# Patient Record
Sex: Female | Born: 1965 | Race: Black or African American | Hispanic: No | Marital: Single | Smoking: Former smoker
Health system: Southern US, Community
[De-identification: ages and names within clinical notes are randomized; demographics above are authoritative.]

## PROBLEM LIST (undated history)

## (undated) DIAGNOSIS — I10 Essential (primary) hypertension: Secondary | ICD-10-CM

## (undated) DIAGNOSIS — R569 Unspecified convulsions: Secondary | ICD-10-CM

## (undated) DIAGNOSIS — N189 Chronic kidney disease, unspecified: Secondary | ICD-10-CM

## (undated) DIAGNOSIS — K7030 Alcoholic cirrhosis of liver without ascites: Secondary | ICD-10-CM

## (undated) HISTORY — PX: OTHER SURGICAL HISTORY: SHX169

## (undated) HISTORY — PX: MANDIBLE SURGERY: SHX707

---

## 2004-01-27 ENCOUNTER — Other Ambulatory Visit: Payer: Self-pay

## 2005-02-25 ENCOUNTER — Emergency Department: Payer: Self-pay | Admitting: Emergency Medicine

## 2005-08-14 ENCOUNTER — Emergency Department: Payer: Self-pay | Admitting: Emergency Medicine

## 2007-07-08 ENCOUNTER — Emergency Department: Payer: Self-pay | Admitting: Emergency Medicine

## 2007-12-12 ENCOUNTER — Emergency Department: Payer: Self-pay | Admitting: Emergency Medicine

## 2008-02-05 ENCOUNTER — Emergency Department: Payer: Self-pay | Admitting: Emergency Medicine

## 2008-02-10 ENCOUNTER — Ambulatory Visit: Payer: Self-pay | Admitting: Unknown Physician Specialty

## 2008-08-21 ENCOUNTER — Other Ambulatory Visit: Payer: Self-pay

## 2008-08-21 ENCOUNTER — Emergency Department: Payer: Self-pay | Admitting: Emergency Medicine

## 2009-08-27 ENCOUNTER — Emergency Department: Payer: Self-pay

## 2009-12-21 ENCOUNTER — Emergency Department: Payer: Self-pay | Admitting: Emergency Medicine

## 2010-05-22 ENCOUNTER — Emergency Department: Payer: Self-pay | Admitting: Emergency Medicine

## 2011-06-11 ENCOUNTER — Emergency Department: Payer: Self-pay | Admitting: Emergency Medicine

## 2013-02-14 ENCOUNTER — Inpatient Hospital Stay: Payer: Self-pay | Admitting: Student

## 2013-02-14 LAB — CBC
HCT: 30 % — ABNORMAL LOW (ref 35.0–47.0)
HGB: 9.9 g/dL — ABNORMAL LOW (ref 12.0–16.0)
MCH: 29.9 pg (ref 26.0–34.0)
MCV: 91 fL (ref 80–100)
Platelet: 75 10*3/uL — ABNORMAL LOW (ref 150–440)
RBC: 3.29 10*6/uL — ABNORMAL LOW (ref 3.80–5.20)
RDW: 17.9 % — ABNORMAL HIGH (ref 11.5–14.5)

## 2013-02-14 LAB — DRUG SCREEN, URINE
Barbiturates, Ur Screen: NEGATIVE (ref ?–200)
Benzodiazepine, Ur Scrn: NEGATIVE (ref ?–200)
MDMA (Ecstasy)Ur Screen: NEGATIVE (ref ?–500)
Opiate, Ur Screen: POSITIVE (ref ?–300)
Tricyclic, Ur Screen: NEGATIVE (ref ?–1000)

## 2013-02-14 LAB — BASIC METABOLIC PANEL
Anion Gap: 11 (ref 7–16)
BUN: 31 mg/dL — ABNORMAL HIGH (ref 7–18)
Calcium, Total: 8.2 mg/dL — ABNORMAL LOW (ref 8.5–10.1)
Co2: 23 mmol/L (ref 21–32)
Creatinine: 1.76 mg/dL — ABNORMAL HIGH (ref 0.60–1.30)
EGFR (African American): 39 — ABNORMAL LOW
EGFR (Non-African Amer.): 34 — ABNORMAL LOW
Glucose: 112 mg/dL — ABNORMAL HIGH (ref 65–99)
Osmolality: 264 (ref 275–301)

## 2013-02-14 LAB — IRON AND TIBC
Iron Bind.Cap.(Total): 273 ug/dL (ref 250–450)
Iron: 14 ug/dL — ABNORMAL LOW (ref 50–170)
Unbound Iron-Bind.Cap.: 259 ug/dL

## 2013-02-14 LAB — HEPATIC FUNCTION PANEL A (ARMC)
Albumin: 2.3 g/dL — ABNORMAL LOW (ref 3.4–5.0)
Alkaline Phosphatase: 60 U/L (ref 50–136)
Bilirubin, Direct: 2.9 mg/dL — ABNORMAL HIGH (ref 0.00–0.20)
Bilirubin,Total: 3.8 mg/dL — ABNORMAL HIGH (ref 0.2–1.0)
SGOT(AST): 112 U/L — ABNORMAL HIGH (ref 15–37)
SGPT (ALT): 44 U/L (ref 12–78)
Total Protein: 8.6 g/dL — ABNORMAL HIGH (ref 6.4–8.2)

## 2013-02-14 LAB — RETICULOCYTES: Reticulocyte: 1.64 %

## 2013-02-15 LAB — CBC WITH DIFFERENTIAL/PLATELET
Bands: 12 %
HCT: 27.2 % — ABNORMAL LOW (ref 35.0–47.0)
HGB: 9 g/dL — ABNORMAL LOW (ref 12.0–16.0)
Lymphocytes: 16 %
MCH: 30.5 pg (ref 26.0–34.0)
MCHC: 33.3 g/dL (ref 32.0–36.0)
Metamyelocyte: 4 %
Monocytes: 17 %
Platelet: 69 10*3/uL — ABNORMAL LOW (ref 150–440)
Segmented Neutrophils: 51 %

## 2013-02-15 LAB — BASIC METABOLIC PANEL
Anion Gap: 11 (ref 7–16)
BUN: 33 mg/dL — ABNORMAL HIGH (ref 7–18)
Calcium, Total: 7.5 mg/dL — ABNORMAL LOW (ref 8.5–10.1)
Chloride: 96 mmol/L — ABNORMAL LOW (ref 98–107)
EGFR (African American): 42 — ABNORMAL LOW
EGFR (Non-African Amer.): 36 — ABNORMAL LOW
Potassium: 2.8 mmol/L — ABNORMAL LOW (ref 3.5–5.1)

## 2013-02-15 LAB — HEMOGLOBIN: HGB: 9.6 g/dL — ABNORMAL LOW (ref 12.0–16.0)

## 2013-02-15 LAB — MAGNESIUM: Magnesium: 1.4 mg/dL — ABNORMAL LOW

## 2013-02-16 LAB — BASIC METABOLIC PANEL
Anion Gap: 5 — ABNORMAL LOW (ref 7–16)
Calcium, Total: 7.7 mg/dL — ABNORMAL LOW (ref 8.5–10.1)
Chloride: 100 mmol/L (ref 98–107)
Co2: 28 mmol/L (ref 21–32)
Creatinine: 0.88 mg/dL (ref 0.60–1.30)
EGFR (African American): >60
EGFR (Non-African Amer.): >60
Glucose: 101 mg/dL — ABNORMAL HIGH (ref 65–99)
Osmolality: 269 (ref 275–301)
Potassium: 4 mmol/L (ref 3.5–5.1)
Sodium: 133 mmol/L — ABNORMAL LOW (ref 136–145)

## 2013-02-16 LAB — CBC WITH DIFFERENTIAL/PLATELET
Bands: 2 %
MCHC: 33.2 g/dL (ref 32.0–36.0)
Platelet: 95 10*3/uL — ABNORMAL LOW (ref 150–440)
RBC: 3 10*6/uL — ABNORMAL LOW (ref 3.80–5.20)
Segmented Neutrophils: 78 %
Variant Lymphocyte - H1-Rlymph: 1 %

## 2013-02-17 DIAGNOSIS — I059 Rheumatic mitral valve disease, unspecified: Secondary | ICD-10-CM

## 2013-02-17 LAB — CULTURE, BLOOD (SINGLE)

## 2013-02-18 LAB — HEPATIC FUNCTION PANEL A (ARMC)
Albumin: 1.9 g/dL — ABNORMAL LOW (ref 3.4–5.0)
Alkaline Phosphatase: 85 U/L (ref 50–136)
Bilirubin, Direct: 0.8 mg/dL — ABNORMAL HIGH (ref 0.00–0.20)
Bilirubin,Total: 1.2 mg/dL — ABNORMAL HIGH (ref 0.2–1.0)
SGOT(AST): 113 U/L — ABNORMAL HIGH (ref 15–37)
Total Protein: 7.5 g/dL (ref 6.4–8.2)

## 2013-02-18 LAB — CULTURE, BLOOD (SINGLE)

## 2014-03-28 ENCOUNTER — Emergency Department: Payer: Self-pay | Admitting: Emergency Medicine

## 2014-07-22 ENCOUNTER — Inpatient Hospital Stay: Payer: Self-pay | Admitting: Specialist

## 2014-07-22 LAB — DIFFERENTIAL
BASOS ABS: 1 %
EOS PCT: 1 %
Lymphocytes: 53 %
MONOS PCT: 5 %
NRBC/100 WBC: 1 /
Segmented Neutrophils: 40 %

## 2014-07-22 LAB — COMPREHENSIVE METABOLIC PANEL
ALBUMIN: 3 g/dL — AB (ref 3.4–5.0)
ANION GAP: 12 (ref 7–16)
Alkaline Phosphatase: 60 U/L
BUN: 5 mg/dL — ABNORMAL LOW (ref 7–18)
Bilirubin,Total: 0.5 mg/dL (ref 0.2–1.0)
CALCIUM: 8.1 mg/dL — AB (ref 8.5–10.1)
CO2: 21 mmol/L (ref 21–32)
Chloride: 102 mmol/L (ref 98–107)
Creatinine: 0.84 mg/dL (ref 0.60–1.30)
EGFR (African American): >60
EGFR (Non-African Amer.): >60
Glucose: 112 mg/dL — ABNORMAL HIGH (ref 65–99)
OSMOLALITY: 268 (ref 275–301)
Potassium: 3.6 mmol/L (ref 3.5–5.1)
SGOT(AST): 67 U/L — ABNORMAL HIGH (ref 15–37)
SGPT (ALT): 38 U/L
Sodium: 135 mmol/L — ABNORMAL LOW (ref 136–145)
Total Protein: 8.7 g/dL — ABNORMAL HIGH (ref 6.4–8.2)

## 2014-07-22 LAB — DRUG SCREEN, URINE
Amphetamines, Ur Screen: NEGATIVE (ref ?–1000)
Barbiturates, Ur Screen: NEGATIVE (ref ?–200)
Benzodiazepine, Ur Scrn: NEGATIVE (ref ?–200)
Cannabinoid 50 Ng, Ur ~~LOC~~: NEGATIVE (ref ?–50)
Cocaine Metabolite,Ur ~~LOC~~: POSITIVE (ref ?–300)
MDMA (ECSTASY) UR SCREEN: NEGATIVE (ref ?–500)
Methadone, Ur Screen: NEGATIVE (ref ?–300)
Opiate, Ur Screen: NEGATIVE (ref ?–300)
PHENCYCLIDINE (PCP) UR S: NEGATIVE (ref ?–25)
TRICYCLIC, UR SCREEN: NEGATIVE (ref ?–1000)

## 2014-07-22 LAB — CBC
HCT: 15.6 % — AB (ref 35.0–47.0)
HGB: 4.6 g/dL — CL (ref 12.0–16.0)
MCH: 18.9 pg — ABNORMAL LOW (ref 26.0–34.0)
MCHC: 29.5 g/dL — ABNORMAL LOW (ref 32.0–36.0)
MCV: 64 fL — ABNORMAL LOW (ref 80–100)
Platelet: 195 10*3/uL (ref 150–440)
RBC: 2.43 10*6/uL — AB (ref 3.80–5.20)
RDW: 21.1 % — AB (ref 11.5–14.5)
WBC: 6.5 10*3/uL (ref 3.6–11.0)

## 2014-07-22 LAB — TROPONIN I: TROPONIN-I: 0.04 ng/mL

## 2014-07-22 LAB — ETHANOL
Ethanol %: 0.391 % (ref 0.000–0.080)
Ethanol: 391 mg/dL

## 2014-07-22 LAB — CK TOTAL AND CKMB (NOT AT ARMC)
CK, Total: 148 U/L
CK-MB: 1.2 ng/mL (ref 0.5–3.6)

## 2014-07-22 LAB — PRO B NATRIURETIC PEPTIDE: B-Type Natriuretic Peptide: 74 pg/mL (ref 0–125)

## 2014-07-23 LAB — CBC WITH DIFFERENTIAL/PLATELET
Basophil #: 0.1 10*3/uL (ref 0.0–0.1)
Basophil %: 1 %
EOS ABS: 0 10*3/uL (ref 0.0–0.7)
EOS PCT: 1 %
Eosinophil %: 0.4 %
HCT: 22.2 % — AB (ref 35.0–47.0)
HCT: 21.5 % — ABNORMAL LOW (ref 35.0–47.0)
HCT: 23.2 % — AB (ref 35.0–47.0)
HGB: 6.8 g/dL — ABNORMAL LOW (ref 12.0–16.0)
HGB: 6.4 g/dL — ABNORMAL LOW (ref 12.0–16.0)
HGB: 7 g/dL — ABNORMAL LOW (ref 12.0–16.0)
Lymphocyte #: 1.7 10*3/uL (ref 1.0–3.6)
Lymphocyte %: 26.1 %
Lymphocytes: 50 %
Lymphocytes: 31 %
MCH: 22.2 pg — ABNORMAL LOW (ref 26.0–34.0)
MCH: 21.5 pg — ABNORMAL LOW (ref 26.0–34.0)
MCH: 21.8 pg — ABNORMAL LOW (ref 26.0–34.0)
MCHC: 30.4 g/dL — AB (ref 32.0–36.0)
MCHC: 29.7 g/dL — ABNORMAL LOW (ref 32.0–36.0)
MCHC: 30.2 g/dL — AB (ref 32.0–36.0)
MCV: 73 fL — ABNORMAL LOW (ref 80–100)
MCV: 72 fL — ABNORMAL LOW (ref 80–100)
MCV: 72 fL — ABNORMAL LOW (ref 80–100)
MONOS PCT: 13.4 %
Monocyte #: 0.9 x10 3/mm (ref 0.2–0.9)
Monocytes: 11 %
Monocytes: 9 %
NRBC/100 WBC: 1 /
NRBC/100 WBC: 1 /
Neutrophil #: 3.8 10*3/uL (ref 1.4–6.5)
Neutrophil %: 59.1 %
PLATELETS: 156 10*3/uL (ref 150–440)
Platelet: 152 10*3/uL (ref 150–440)
Platelet: 144 10*3/uL — ABNORMAL LOW (ref 150–440)
RBC: 3.04 10*6/uL — AB (ref 3.80–5.20)
RBC: 2.97 10*6/uL — ABNORMAL LOW (ref 3.80–5.20)
RBC: 3.2 10*6/uL — AB (ref 3.80–5.20)
RDW: 27.9 % — ABNORMAL HIGH (ref 11.5–14.5)
RDW: 27.7 % — ABNORMAL HIGH (ref 11.5–14.5)
RDW: 27.8 % — ABNORMAL HIGH (ref 11.5–14.5)
SEGMENTED NEUTROPHILS: 38 %
Segmented Neutrophils: 60 %
WBC: 5 10*3/uL (ref 3.6–11.0)
WBC: 5.2 10*3/uL (ref 3.6–11.0)
WBC: 6.4 10*3/uL (ref 3.6–11.0)

## 2014-07-23 LAB — TROPONIN I
TROPONIN-I: 0.04 ng/mL
TROPONIN-I: 0.04 ng/mL

## 2014-07-23 LAB — LIPID PANEL
CHOLESTEROL: 87 mg/dL (ref 0–200)
HDL Cholesterol: 32 mg/dL — ABNORMAL LOW (ref 40–60)
LDL CHOLESTEROL, CALC: 45 mg/dL (ref 0–100)
Triglycerides: 48 mg/dL (ref 0–200)
VLDL Cholesterol, Calc: 10 mg/dL (ref 5–40)

## 2014-07-23 LAB — CK-MB
CK-MB: 1 ng/mL (ref 0.5–3.6)
CK-MB: 1 ng/mL (ref 0.5–3.6)

## 2014-07-23 LAB — TSH: Thyroid Stimulating Horm: 2.15 u[IU]/mL

## 2014-07-23 LAB — IRON AND TIBC
IRON BIND. CAP.(TOTAL): 493 ug/dL — AB (ref 250–450)
Iron Saturation: 15 %
Iron: 74 ug/dL (ref 50–170)
Unbound Iron-Bind.Cap.: 419 ug/dL

## 2014-07-23 LAB — FERRITIN: Ferritin (ARMC): 21 ng/mL (ref 8–388)

## 2014-07-23 LAB — LACTATE DEHYDROGENASE: LDH: 169 U/L (ref 81–246)

## 2014-07-24 LAB — PROTIME-INR
INR: 1.3
PROTHROMBIN TIME: 15.6 s — AB (ref 11.5–14.7)

## 2014-07-24 LAB — HEMOGLOBIN: HGB: 6.9 g/dL — ABNORMAL LOW (ref 12.0–16.0)

## 2014-07-25 LAB — CBC WITH DIFFERENTIAL/PLATELET
BASOS ABS: 0 10*3/uL (ref 0.0–0.1)
BASOS PCT: 0.5 %
EOS ABS: 0.1 10*3/uL (ref 0.0–0.7)
Eosinophil %: 1.4 %
HCT: 22.7 % — AB (ref 35.0–47.0)
HGB: 6.7 g/dL — ABNORMAL LOW (ref 12.0–16.0)
LYMPHS ABS: 1.4 10*3/uL (ref 1.0–3.6)
Lymphocyte %: 18.3 %
MCH: 21.6 pg — AB (ref 26.0–34.0)
MCHC: 29.6 g/dL — ABNORMAL LOW (ref 32.0–36.0)
MCV: 73 fL — AB (ref 80–100)
Monocyte #: 1 x10 3/mm — ABNORMAL HIGH (ref 0.2–0.9)
Monocyte %: 13.6 %
Neutrophil #: 5 10*3/uL (ref 1.4–6.5)
Neutrophil %: 66.2 %
PLATELETS: 139 10*3/uL — AB (ref 150–440)
RBC: 3.11 10*6/uL — AB (ref 3.80–5.20)
RDW: 28.2 % — AB (ref 11.5–14.5)
WBC: 7.6 10*3/uL (ref 3.6–11.0)

## 2014-07-25 LAB — DRUG SCREEN, URINE
AMPHETAMINES, UR SCREEN: NEGATIVE (ref ?–1000)
BARBITURATES, UR SCREEN: NEGATIVE (ref ?–200)
BENZODIAZEPINE, UR SCRN: NEGATIVE (ref ?–200)
COCAINE METABOLITE, UR ~~LOC~~: NEGATIVE (ref ?–300)
Cannabinoid 50 Ng, Ur ~~LOC~~: NEGATIVE (ref ?–50)
MDMA (ECSTASY) UR SCREEN: NEGATIVE (ref ?–500)
Methadone, Ur Screen: NEGATIVE (ref ?–300)
Opiate, Ur Screen: NEGATIVE (ref ?–300)
Phencyclidine (PCP) Ur S: NEGATIVE (ref ?–25)
TRICYCLIC, UR SCREEN: NEGATIVE (ref ?–1000)

## 2014-07-25 LAB — BASIC METABOLIC PANEL
Anion Gap: 7 (ref 7–16)
BUN: 7 mg/dL (ref 7–18)
CO2: 26 mmol/L (ref 21–32)
Calcium, Total: 8.7 mg/dL (ref 8.5–10.1)
Chloride: 103 mmol/L (ref 98–107)
Creatinine: 0.91 mg/dL (ref 0.60–1.30)
GLUCOSE: 121 mg/dL — AB (ref 65–99)
Osmolality: 271 (ref 275–301)
Potassium: 3.6 mmol/L (ref 3.5–5.1)
SODIUM: 136 mmol/L (ref 136–145)

## 2014-07-26 LAB — CBC WITH DIFFERENTIAL/PLATELET
Basophil #: 0.1 10*3/uL (ref 0.0–0.1)
Basophil %: 0.7 %
EOS PCT: 1.9 %
Eosinophil #: 0.2 10*3/uL (ref 0.0–0.7)
HCT: 28.5 % — AB (ref 35.0–47.0)
HGB: 9.1 g/dL — ABNORMAL LOW (ref 12.0–16.0)
Lymphocyte #: 1.6 10*3/uL (ref 1.0–3.6)
Lymphocyte %: 18.3 %
MCH: 24.2 pg — AB (ref 26.0–34.0)
MCHC: 32 g/dL (ref 32.0–36.0)
MCV: 76 fL — ABNORMAL LOW (ref 80–100)
MONO ABS: 1.1 x10 3/mm — AB (ref 0.2–0.9)
Monocyte %: 12.6 %
NEUTROS ABS: 5.7 10*3/uL (ref 1.4–6.5)
Neutrophil %: 66.5 %
Platelet: 134 10*3/uL — ABNORMAL LOW (ref 150–440)
RBC: 3.77 10*6/uL — AB (ref 3.80–5.20)
RDW: 28.7 % — AB (ref 11.5–14.5)
WBC: 8.6 10*3/uL (ref 3.6–11.0)

## 2014-07-26 LAB — OCCULT BLOOD X 1 CARD TO LAB, STOOL: Occult Blood, Feces: POSITIVE

## 2014-07-28 LAB — PATHOLOGY REPORT

## 2015-04-12 NOTE — Discharge Summary (Signed)
PATIENT NAME:  Abigail Cook, Abigail Cook MR#:  [id number] DATE OF BIRTH:  1966/09/28  DATE OF ADMISSION:  02/14/2013 DATE OF DISCHARGE:  02/18/2013  CHIEF COMPLAINT:  Short of breath and fevers.   DISCHARGE DIAGNOSES:  1.  Septic shock, likely secondary to strep pneumonia and strep pneumonia bacteremia.  2.  Acute respiratory failure.  3.  Acute renal failure.  4.  Elevated liver function tests likely secondary to sepsis.  5.  Chronic anemia.  6.  A history of epistaxis.  7.  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  8.  A history of cocaine and alcohol abuse in the past.   DISCHARGE MEDICATIONS:  1.  Tamiflu 75 mg 1 tab q.12 hours for 2 doses.  2.  Ceftin ER 300 mg 1 cap 2 times a day for 6 days.  3.  Acetaminophen/hydrocodone 325/5 mg 1 tab every 6 hours as needed for pain.   DIET:  Low sodium.   ACTIVITY:  As tolerated.   FOLLOWUP:  Please follow with [facility] on February 27th as previously scheduled later this month. Please repeat x-ray of the chest in 4 weeks or so to re-evaluate pneumoniae. If worsening shortness of breath, productive cough, recurrent fevers, or any other symptoms consult to the doctor right away.   DISPOSITION:  Home.  CODE STATUS: THE PATIENT IS FULL CODE.   HISTORY OF PRESENT ILLNESS:  For full details of H and P, please see the dictation February 25 by Dr. Bridgette Habermann but briefly, this is a 49 year old female who was not on any chronic medications, came in with fevers, productive of sputum and multiple organ failures including renal and respiratory with elevated LFTs and was hypotensive. She was given IV fluids. She had high-grade fever of 103 and a pulse rate 120 and admitted to the Hospitalist service for pneumonia and started with ceftriaxone and azithromycin to cover for community-acquired pneumonia, and Tamiflu to cover for influenza pneumonia given the severity of her illness and started on isolation precautions.   SIGNIFICANT LABS AND IMAGING:   Initial albumin 2.3, a bilirubin of 3.8, a direct bilirubin of 2.9, AST of 112, initial creatinine 1.76, BUN 31, sodium 128, potassium 3, creatinine is normalized at 2.88 by discharge with a sodium of 133, potassium of 4, BUN of 20, ferritin 428, LDH slightly elevated at 275, serum iron is 14 and TIBC is 273. Urine tox was positive for opiates. Initial WBC of 9.5, hemoglobin 9.9, platelets of 75. Blood cultures on admission showed 2 out of 2 bottles with streptococcus pneumonia. Influenza negative. H1N1 negative. Repeat blood cultures from February 27 was no growth to date thus far. Alpha globulin 337. Legionella urine antigen negative, strep urinary antigen was surprisingly negative. The pH on the ABG showed pH of 7.47, pCO2 of 30, pO2 of 69.   Initial x-ray of the chest, 1 view, showed left lower lobe pneumonia. Repeat x-ray of the chest on February 27th showed left lower lobe pneumonia. No pleural effusions or pneumothorax.   Ultrasound of the abdomen, limited survey, showed no cholelithiasis or sonographic evidence of acute cholecystitis. Renal ultrasound, bilateral, showing normal-appearing bilateral renal sonogram.   HOSPITAL COURSE:  The patient was admitted to the Hospitalist service and started on ceftriaxone and azithromycin to cover for community-acquired pneumonia. The patient had multiple lab abnormalities and appeared severely ill. The patient was given IV fluids as well as blood cultures were sent. The patient had a left lower lobe pneumonia. She has pleuritic  chest pain. Blood cultures did come back with strep pneumonia. Initially as they came back with the gram-positive cocci, the Rocephin was changed to vancomycin. The patient's sepsis resolved with treatment and blood cultures speciated to strep pneumonia, and antibiotic was changed to [facility]. At this point, she has no further hypoxemia and the fever has resolved. She has been weaned off of the oxygen. Repeat x-ray of the chest was  performed and that did not show any effusions or evidence for empyema but the patient has persistent pleuritic chest pain, likely from the severe pneumonia she had. Her renal failure and LFTs have significantly improved. Her respiratory failure has resolved. She has anemia which likely is chronic disease and ethanol related. She had thrombocytopenia and anemia. Both are likely secondary to ethanol abuse, and at this point her symptoms are significantly better. Today is third day of 4 for Tamiflu which she was initially started on prophylactically given the severity of her illness. However, H1N1 and rapid flu are negative. She will be discharged with an additional day of Tamiflu. At this point, she has an appointment with [facility] and she was strongly encouraged follow up and repeat an x-ray of the chest in about 4 weeks to evaluate for the resolution of the pneumonia. Her electrolyte abnormalities have significantly improvement including the hypokalemia and the hyponatremia.   Total Time Spent:  35 minutes.  CODE STATUS:  THE PATIENT IS A FULL CODE.   ____________________________ Vivien Presto, MD sa:jm D: 02/18/2013 14:10:36 ET T: 02/18/2013 15:11:25 ET JOB#: [id number]  cc: Vivien Presto, MD, <Dictator> [facility] [place] MD ELECTRONICALLY SIGNED 02/27/2013 13:18

## 2015-04-12 NOTE — H&P (Signed)
PATIENT NAME:  Abigail Cook, COLGATE MR#:  [id number] DATE OF BIRTH:  02/02/1966  DATE OF ADMISSION:  02/14/2013  REFERRING PHYSICIAN:  Dr. Cheri Guppy.   PRIMARY CARE PHYSICIAN:  None.   CHIEF COMPLAINT:  Shortness of breath and fevers.   HISTORY OF PRESENT ILLNESS:  The patient is a pleasant 49 year old African American female with no significant past medical history except for seizures related to alcohol and polysubstance abuse in the past on no medications who presented with the above chief complaint. The patient stated that she had acute onset shortness of breath, productive cough with greenish sputum, fevers, chills, nausea or vomiting and felt unwell around Friday. The patient has no sick contacts nor recent hospitalizations. The patient's shortness of breath progressed. She also describes having chronic epistaxis which can occur daily and has had black tarry stools. The patient feels she has had poor p.o. intake and p.o. intolerance recently. She complains of left-sided pleuritic chest pain on deep breathing. She presented here today and was noted to have left-sided pneumonia, but multiple lab abnormalities including renal failure, hyponatremia and elevated liver enzymes in addition to low platelets with a left-sided pneumonia. She was tachycardic and hypotensive on arrival with a high-grade fever of 103. Her pulse was 120. Initial blood pressure was 82/50. She was given a liter of IV fluids and blood cultures were obtained. Hospitalist services were contacted then to further evaluate and admit the patient.   PAST MEDICAL HISTORY:  History of cocaine and alcohol abuse in the past, sober now, history of seizures related to alcohol and left humerus fracture in the past. There is also right jaw surgery.   ALLERGIES:  Denies.   OUTPATIENT MEDICATIONS:  Denies.   SOCIAL HISTORY:  Smokes several cigarettes a day. Quit alcohol about a year ago or so. Denies having any cocaine use recently. Stated that she is  sober from that as well.   FAMILY HISTORY:  Dad and mom with seizures.   REVIEW OF SYSTEMS:  CONSTITUTIONAL: Positive for fever, fatigue and overall weakness.  EYES: Has chronic blurry vision on the left.  ENT: Positive for sore throat.  RESPIRATORY: Positive for productive cough, shortness of breath and painful respiration on the left side of the chest.  CARDIOVASCULAR: Denies swelling in the legs or high blood pressure. No palpitations.  GASTROINTESTINAL: Positive for nausea, vomiting and diarrhea. The nausea and vomiting are more recent, several times a day, and has 1 episode of black tarry stools daily also since Friday or so, but is getting better per patient. No constipation.  GENITOURINARY: Denies dysuria or hematuria.  HEMATOLOGIC/LYMPHATICS: Denies anemia or easy bruising.  SKIN: Denies any rashes.  MUSCULOSKELETAL: Has chronic hip pain.  NEUROLOGIC:  Global weakness without any focal weakness. No stroke or seizures recently.  PSYCHIATRIC: Denies anxiety or insomnia.   PHYSICAL EXAMINATION: VITAL SIGNS: Temperature on arrival is 103.1, pulse 120, respiratory rate initially noted 20, but while I was in the room, it was between 35 to 40 breaths per minute, initial blood pressure 82/50, last blood pressure 111/52 and sats 94% on room air noted.  GENERAL: The patient is an [place] American female lying in bed in moderate respiratory distress talking in short sentences and appears to be dyspneic after talking.  HEENT: Normocephalic, atraumatic. Pupils are equal and reactive. Anicteric sclerae and also injected sclerae. Right-sided facial healed scar about the jawline. Dry mucous membranes.  NECK: Supple. No thyroid tenderness. No cervical lymphadenopathy.  CARDIOVASCULAR: S1, S2, tachycardic. No significant  murmurs appreciated.  LUNGS: Left mid to lower lobe rales, bilateral diffuse wheezing and decreased breath sounds.  ABDOMEN: Soft, nontender, nondistended. Positive bowel sounds in  all quadrants. No organomegaly appreciated.  EXTREMITIES: No significant lower extremity edema.  SKIN: No obvious rashes.  NEUROLOGIC: Cranial nerves II through XII grossly intact. Strength is 5 out of 5 in all extremities. Sensation is intact to light touch.  PSYCHIATRIC: Awake, alert, oriented x 3, a little bit anxious.   DIAGNOSTIC DATA:  Glucose 112, BUN 31, creatinine 1.76, sodium of 128, potassium of 3, total protein 8.6, albumin of 2.3, bilirubin total 3.8, direct is 2.9, AST 112 (of note, it was in the 200 range previously in 2011 and in 2012) and ALT of 44. WBC is 9.5, hemoglobin 9.9, platelets 75, MCV 91. X-ray of the chest, single view, is showing findings worrisome for left lower lobe pneumonia and EKG sinus tach, rate is 117, nonspecific ST and T wave changes, no acute ST elevations.   ASSESSMENT AND PLAN:  We have a 49 year old African American female with history of seizures relating to alcohol use in the past which patient stated that she is sober now with tobacco abuse who presents with acute onset of shortness of breath and multiple other complaints including fevers, chills and pleuritic chest pain and found with severe sepsis with hypotension on admission with renal failure, acute on chronic thrombocytopenia, elevated liver function tests as well as left-sided pneumonia. The patient appears to have severe sepsis with significant tachycardia, fever, pneumonia and renal failure. We will panculture her including blood cultures, sputum cultures, rapid flu and H1N1. Given the severity of her symptoms and organ involvement, I will start her on Tamiflu prophylactically and start her on ceftriaxone and azithromycin intravenously and place her on droplet precautions at this point. The patient denies having any recent antibiotics or hospitalizations in which makes this a community-acquired pneumonia. We will follow the sputum cultures, obtain urine strep and Legionella antigens and we will have a  low threshold for upgrading the antibiotics. The patient appears to have renal failure secondary to sepsis as well as some hypotension on admission which could be acute tubular necrosis in nature. We will give her another liter of intravenous fluid bolus and start her on standing intravenous fluids and trend her glomerular filtration rate. She also has hyponatremia likely secondary to dehydration and will monitor it after treating with normal saline. The patient appears to have anemia and thrombocytopenia, both are chronic, but thrombocytopenia is more so than baseline. The patient does have elevated liver function tests and an element of indirect bilirubin. It is possible that this is hemolysis, but more so it could be the epistaxis the patient is experiencing. We will also start her on proton pump inhibitor b.i.d. given that she has had some dark melenic stools per her description and follow hemoglobin and hematocrit every 8 hours. Given the chronicity of her epistaxis, it is possible that the dark stool is simply swallowed blood. I will consider a gastroenterology versus ears, nose and throat consult and check iron studies, ferritin, LDH and reticulocyte count. The patient appears to have respiratory failure which is acute, is tachypneic, and speaks in short sentences. We will monitor her closely, start her on nebulizers, treat the pneumonia and check an ABG and lactic acid. We will consider bilevel positive airway pressure if she needs it. We will start her on Zofran for nausea and vomiting and get an abdominal ultrasound for the elevated liver function  tests. It is also possible that the liver function test elevation is secondary to her severe sepsis. The patient was also counseled for 3 minutes and will be started on a nicotine patch for her tobacco abuse. The patient overall appears to be at high risk of cardiopulmonary complications and arrest.   CODE STATUS: The patient is a full code.  CRITICAL CARE  TIME SPENT:  70 minutes.    ____________________________ Vivien Presto, MD sa:si D: 02/14/2013 16:48:48 ET T: 02/14/2013 17:31:03 ET JOB#: [id number]  cc: Vivien Presto, MD, <Dictator> Vivien Presto MD ELECTRONICALLY SIGNED 02/27/2013 13:17

## 2015-04-13 NOTE — H&P (Signed)
PATIENT NAME:  Abigail Cook, BRUN MR#:  [id number] DATE OF BIRTH:  19-Mar-1966  DATE OF ADMISSION:  07/22/2014  REFERRING PHYSICIAN: Dr. Jimmye Norman  PRIMARY CARE PHYSICIAN: None.   CHIEF COMPLAINT: Chest pain.  HISTORY OF PRESENT ILLNESS: A 49 year old African American female with a history of alcohol abuse, cocaine abuse presenting with chest pain. Her history is somewhat limited, as patient is currently intoxicated; apparently drank 80 ounces of beer prior to arrival as well as took cocaine in some form or another. States that she had chest pain which was acute onset, retrosternal location, described only as "pain." Intensity 9/10 with radiation to her hips. No worsening or relieving factors. Denies any shortness of breath associated with symptoms. On EMS arrival, she appeared anxious, agitated, and hyperventilating, followed by a brief syncopal episode without any noted seizure activity. On initial workup, she was found to be anemic, with hemoglobin of 4.6. Last known hemoglobin was in the 9 range throughout all of our documentation. She denies any bleeding or bruising, any change in bowel habits. Denies any melena or hematochezia. She states that she has had no vaginal bleeding. No period since 1987 for unknown etiology. Currently she is still anxious; however, has no further complaints.  REVIEW OF SYSTEMS:  CONSTITUTIONAL: Denies fevers, chills, fatigue.  EYES: Denies blurred vision, double vision, or eye pain.  EARS, NOSE, THROAT: Denies tenderness, ear pain, hearing loss.  RESPIRATORY: Denies cough, wheeze, shortness of breath.  CARDIOVASCULAR: Positive for chest pain as described above as well as syncope as described above. Denies any palpitations, edema.  GASTROINTESTINAL: Denies nausea, vomiting, diarrhea, abdominal pain, hematochezia, melena.  GENITOURINARY: Denies dysuria, hematuria. ENDOCRINE: Denies nocturia or thyroid problems.  HEMATOLOGIC AND LYMPHATICS: Denies any bruising or  bleeding.  SKIN: Denies rashes or lesions.  MUSCULOSKELETAL: Denies pain in neck, back, shoulder, knees, hips, or arthritic symptoms.  NEUROLOGIC: She denies paralysis, paresthesias.  PSYCHIATRIC: Denies anxiety or depressive symptoms Otherwise, full review of systems performed by me is negative.   PAST MEDICAL HISTORY: Alcohol abuse with a history of withdrawal seizures, tobacco abuse, as well as cocaine abuse. Denies any IV drug usage.   SOCIAL HISTORY: Positive for alcohol, tobacco, and cocaine usage as described above. Last drink just prior to arrival. She states she drinks on a daily basis, around 80 ounces of beer. Positive for cocaine use as above; however, she denies this.   FAMILY HISTORY: Positive for diabetes.   ALLERGIES: No known drug allergies.   HOME MEDICATIONS: None   PHYSICAL EXAMINATION:  VITAL SIGNS: Temperature 98, heart rate 98, respirations 20, blood pressure 118/65, saturating 100% on room air, weight 97.1 kg, BMI 39.2.  GENERAL: Well-nourished, well-developed, African American female, currently in minimal distress given anxiety.  HEAD: Normocephalic, atraumatic.  EYES: Pupils equal, round, and reactive to light. Extraocular muscles intact. No scleral icterus.  MOUTH: Moist mucosal membranes. Poor dentition. No abscess noted. EAR, NOSE, THROAT: Clear without exudates. No external lesions.  NECK: Supple. No thyromegaly. No nodules. No JVD.  PULMONARY: Clear to auscultation bilaterally without wheezes, rubs, or rhonchi. No use of accessory muscles. Good respiratory effort.  CHEST: Nontender to palpation.  CARDIOVASCULAR: S1, S2, regular rate and rhythm. No murmurs, rubs, or gallops. No edema. Pedal pulses 2+ bilaterally.  GASTROINTESTINAL: Soft, nontender, nondistended. No masses. Positive bowel sounds. Fecal occult negative.  MUSCULOSKELETAL: No swelling, clubbing, or edema. Range of motion full in all extremities.  NEUROLOGIC: Cranial nerves II through XII  intact. No gross focal neurological deficits.  Sensation intact. Reflexes intact.  SKIN: No ulceration, lesions, rashes, or cyanosis. Skin warm, dry. Turgor intact.  PSYCHIATRIC: Mood and affect anxious. She is awake, alert, oriented x 3, although she is intoxicated so her answers are somewhat delayed. Insight and judgment appear to be poor.   LABORATORY DATA: EKG performed revealing normal sinus rhythm. No ST or T wave abnormalities. Remainder of laboratory data: Sodium 135, potassium 3.6, chloride 102, bicarbonate 21, BUN 5, creatinine 0.84, glucose 112, alcohol 391. LFTs: Total protein 8.7, albumin 3, AST of 67, otherwise within normal limits. Troponin I 0.04. Urine drug screen positive for cocaine. WBC 6.5, hemoglobin 4.6, platelets of 195,000, MCV of 64 with an RDW of 21.1.   ASSESSMENT AND PLAN: A 49 year old [place] American female with a history of alcohol and cocaine abuse presenting with chest pain, found to be anemic. 1.  Chest pain. Cocaine positive. Will avoid any beta blockade with telemetry. Initiate aspirin. We will trend cardiac enzymes. I suspect this is mostly related to demand ischemia, given cocaine use and anemia. Once again, give aspirin now and start aspirin and statin therapy. Can add nitroglycerin p.r.n. for chest pain.  2.  Anemia which is microcytic with elevated RDW suggestive of bleeding. She denies any gastrointestinal bleed. Fecal occult blood test is negative. We will add proton pump inhibitor therapy. She is being transfused 2 units currently. Transfusion goal would be to have a hemoglobin greater than 7; however, if chest pain continues, will have hemoglobin goal greater than 10. Trend CBCs q. 6 hours and will defer any anticoagulation for chest pain given unknown etiology of anemia.  3.  Alcohol abuse. Actually intoxicated now. Regardless, will add CIWA protocol as she does have a history of withdrawal seizures.  4.  Hyponatremia. Intravenous fluid hydration with normal  saline.  5.  Venous thromboembolism prophylaxis with sequential compression devices.  6.  Patient is full code.   TIME SPENT: 45 minutes.    ____________________________ Aaron Mose. Jachin Coury, MD dkh:sk D: 07/22/2014 21:36:03 ET T: 07/22/2014 22:15:57 ET JOB#: [id number]  cc: Aaron Mose. Jonni Oelkers, MD, <Dictator> Advika Mclelland Woodfin Ganja MD ELECTRONICALLY SIGNED 07/23/2014 0:58

## 2015-04-13 NOTE — Discharge Summary (Signed)
PATIENT NAME:  Abigail Cook, Abigail Cook MR#:  [id number] DATE OF BIRTH:  1966/09/26  DATE OF ADMISSION:  07/22/2014 DATE OF DISCHARGE:  07/26/2014  For a detailed note, please look at the history and physical done on admission by Dr. Valentino Nose.   DIAGNOSES AT DISCHARGE:  1.  Microcytic anemia, source unclear. 2.  Chest pain due to demand ischemia from her severe anemia, but now resolved. 3.  Polysubstance abuse.   DIET:  The patient is being discharged on a regular diet.   ACTIVITIES:  As tolerated.  FOLLOWUP:  With Dr. Rayann Heman in next 1-2 weeks. Also follow up at the [facility] in next  1-2 weeks.   DISCHARGE MEDICATIONS:  Protonix 40 mg b.i.d.; iron sulfate 811 mg t.i.d.; folic acid 1 mg daily; and thiamine 5 mg daily.   [place] COURSE: Arther Dames, MD, from gastroenterology.   PERTINENT STUDIES DONE DURING THE HOSPITAL COURSE: A chest x-ray done on admission showing no acute cardiopulmonary disease. An ultrasound of the abdomen done showing nodularity of the liver surface with diffuse heterogeneity of the parenchyma consistent with hepatic cirrhosis; no discrete lesion. No nodule identified. Cholelithiasis. Negative for ascites. An upper GI endoscopy done on August 6 showing pigmented spot at the GE junction, doubt this is the source of the bleeding, gastritis, and normal duodenum. The patient also had a colonoscopy done which showed one 4 mm polyp in the sigmoid colon which was resected. No other abnormalities noted.   HOSPITAL COURSE: This is a 49 year old female who presented to the hospital with complaints of chest pain and noted to be severely anemic with a hemoglobin of 4.  Problem #1:  Chest pain. This was a presenting complaint for the patient. This was likely secondary to demand ischemia, as she was found to be profoundly anemic, and history of cocaine abuse. She had no EKG changes. There was no elevation of her cardiac markers. She was transfused during  the hospitalization and her chest pain since then has improved and resolved, and she is currently hemodynamically stable.   Problem #2:  Microcytic anemia. The patient has a severe microcytic anemia with a hemoglobin of 4 when she was admitted to the hospital. The patient was transfused a total of 4 units of packed red blood cells while in the hospital. Posttransfusion, the patient's hemoglobin has remained stable. The patient was seen by gastroenterology. They ended up performing an upper GI endoscopy and a colonoscopy, both of which did not show a source of acute bleeding. The patient's hemoglobin is currently stable and she is clinically asymptomatic. She is therefore being discharged home on PPI b.i.d.  She will follow up with gastroenterology to get outpatient capsule endoscopy done in the near future.   Problem #3:  Hepatic cirrhosis. This was seen on the ultrasound. The patient had no evidence of hepatic encephalopathy. She was strongly advised to abstain from alcohol.   Problem #4:  Polysubstance abuse. The patient's urine toxicology was positive for cocaine. She was maintained on CIWA protocol while in the hospital, but did not have any evidence of alcohol withdrawal. I did discharge her on thiamine and folate.   The patient is a full code.   TIME SPENT ON DISCHARGE: Forty minutes.    ____________________________ Belia Heman. Verdell Carmine, MD vjs:lb D: 07/27/2014 15:00:00 ET T: 07/28/2014 02:21:15 ET JOB#: [id number]  cc: Belia Heman. Verdell Carmine, MD, <Dictator> [facility] Arther Dames, MD   Henreitta Leber MD ELECTRONICALLY SIGNED 08/03/2014  14:28 

## 2015-04-13 NOTE — Consult Note (Signed)
Details:   - GI Note:  I have seen and examined Ms Azad.  She has a severe IDA with no overt bleeding.    Will plan for EGD and colon on Thursday as long as her UDS clears.   Clears tomorrow, if UDS clear, then prep tomorrow night and then Thur morning from 5 to 7 am.   Electronic Signatures: Arther Dames (MD)  (Signed 04-Aug-15 15:32)  Authored: Details   Last Updated: 04-Aug-15 15:32 by Arther Dames (MD)

## 2015-04-13 NOTE — Consult Note (Signed)
PATIENT NAME:  Abigail Cook, Abigail Cook MR#:  [id number] DATE OF BIRTH:  [birth year]  DATE OF CONSULTATION:  07/24/2014  REFERRING PHYSICIAN:  Dr. Volanda Napoleon. CONSULTING PHYSICIAN:  Corky Sox. Kahari Critzer, PA-Cfor Dr. Arther Dames.  ATTENDING GASTROENTEROLOGIST: Dr. Rayann Heman.  REASON FOR CONSULTATION: Iron deficiency anemia.   HISTORY OF PRESENT ILLNESS: This pleasant 49 year old female, who initially was admitted with complaints of chest pain, and was acutely intoxicated, and also had recently used cocaine. Thus far, cardiac work-up has been negative including cardiac enzymes and an EKG. The chest pain has actually resolved, but upon further work-up, she was found to have profound iron deficiency anemia. At admission, her hemoglobin was 4.6 and she is status post blood transfusion. Since that time, her hemoglobin has ranged between 6.4 and 7.0, and she is scheduled to get an additional transfusion today. She does report excessive fatigue and lightheadedness. She denies any abdominal pain. She had an isolated episode of vomiting this morning, but denies any hematemesis. She has been moving her bowels regularly and denies any black or bloody stools. No diarrhea or constipation. She does report occasional nosebleeds secondary to cocaine use, but states that she has not seen any nose bleeds recently and typically when this does occur it is relatively mild. She denies any prior history of an EGD or colonoscopy. Her stool was tested and was heme negative. MCV has been microcytic at 72 with an elevated TIBC of 493. Otherwise, she is overall feeling well today with no other symptomatic concerns or complaints.   PAST MEDICAL HISTORY: Amenorrhea, alcohol use and abuse, cocaine use, and history of withdrawal seizures.   PAST SURGICAL HISTORY: Arm surgery and foot surgery.   SOCIAL HISTORY: The patient does report alcohol, tobacco, and illicit drug use. Specifically, cocaine use. She was acutely intoxicated on alcohol when she was  admitted to the hospital and does get alcohol intake on a daily basis. Urine drug screen was positive for cocaine.   ALLERGIES: NO KNOWN DRUG ALLERGIES.   HOME MEDICATIONS: No medications taken at home.   FAMILY HISTORY: There is no known family history of GI malignancy, colon polyps or IBD.   REVIEW OF SYSTEMS: Ten-system review of systems was obtained on the patient. Pertinent positives are mentioned above and otherwise negative.   PHYSICAL EXAMINATION:   VITAL SIGNS: Blood pressure 150/78, heart rate 56, respirations 18, temperature 98.2, bedside pulse oximetry 100%.  GENERAL: This is a pleasant 49 year old female resting quietly and comfortably in bed in no acute distress. Alert and oriented x 3.  HEAD: Atraumatic, normocephalic.  NECK: Supple. No lymphadenopathy noted.  HEENT: Sclerae anicteric. Mucous membranes moist.  PULMONARY: Respirations are even and unlabored. Clear to auscultation in bilateral anterior lung fields.  CARDIAC: Regular rate and rhythm. S1, S2 noted.  ABDOMEN: Soft, nontender, nondistended. Normoactive bowel sounds noted in all 4 quadrants. No guarding or rebound. No masses, hernias or organomegaly appreciated.  RECTAL: Deferred.  PSYCHIATRIC: Appropriate affect.  NEUROLOGIC: Cranial nerves II-XII are grossly intact.   LABORATORY DATA: White blood cells 6.4, hemoglobin 6.9, hematocrit 23.2, platelets 156,000, MCV 72.  Hemoglobin at admission was only 4.6. She is heme negative. Ferritin 21. TSH 2.15, iron 74, TIBC 493, percent saturation of iron is 15. Cardiac enzymes have been negative. BNP is 74. Urine drug screen was positive for cocaine and ethanol. Alcohol percentage was 0.391. Bilirubin 0.5, alkaline phosphatase 60, ALT 30, AST 67.   ASSESSMENT:  1.  Chest pain, resolved since being admitted.  EKG and cardiac  enzymes have been negative.   2.  Profound iron deficiency anemia with an admitting hemoglobin of only 4.6. She is status post transfusion and most  recent hemoglobin was 6.9. Vital signs have remained stable.   3.  History of alcohol use and abuse on a daily basis. Also cocaine use. Her urine drug screen ess positive for cocaine at admission.   4.  Elevated transaminases, likely secondary to her chronic alcohol intake.   5.  Frequent nose bleeds.   PLAN: I have discussed this patient's case in detail with Dr. Arther Dames, who is involved in the development of the patient's plan of care. Certainly, because of her profound anemia, we do feel an appropriate next step would be to perform an EGD and a colonoscopy to rule out a GI source of blood loss. Certainly, a factor in this could be her recurring nosebleeds as well. The patient has eaten solid foods today. Therefore, we will keep on a clear liquid diet tomorrow, keep her n.p.o. after midnight tomorrow, Wednesday night, and we can proceed with an EGD and a colonoscopy with Dr. Rayann Heman on Thursday, August 6. These procedures were explained in detail to the patient including risks and benefits, and she has verbalized understanding and is in agreement to proceed. In the interim, we would like to check a PT/INR, and an abdominal ultrasound to evaluate for any signs of underlying liver damage secondary to her chronic alcohol use. All questions were answered. We will continue to follow this patient throughout hospitalization and make further recommendations pending above and per clinical course.   Thank you so much for this consultation and for allowing [place] to participate in the patient's plan of care.     ____________________________ Corky Sox. Melaysia Streed, PA-C kme:ts D: 07/24/2014 14:23:54 ET T: 07/24/2014 14:47:36 ET JOB#: [id number]  cc: Corky Sox. Timmothy Baranowski, PA-C, <Dictator> [place] PA ELECTRONICALLY SIGNED 07/25/2014 9:30

## 2015-08-14 ENCOUNTER — Emergency Department
Admission: EM | Admit: 2015-08-14 | Discharge: 2015-08-14 | Disposition: A | Discharge status: Home / Self Care | Payer: Medicaid Other | Attending: Emergency Medicine | Admitting: Emergency Medicine

## 2015-08-14 ENCOUNTER — Emergency Department: Payer: Medicaid Other

## 2015-08-14 ENCOUNTER — Encounter: Payer: Self-pay | Admitting: Emergency Medicine

## 2015-08-14 DIAGNOSIS — Z720 Tobacco use: Secondary | ICD-10-CM | POA: Diagnosis not present

## 2015-08-14 DIAGNOSIS — R52 Pain, unspecified: Secondary | ICD-10-CM

## 2015-08-14 DIAGNOSIS — M25551 Pain in right hip: Secondary | ICD-10-CM | POA: Diagnosis not present

## 2015-08-14 MED ORDER — TRAMADOL HCL 50 MG PO TABS
50.0000 mg | ORAL_TABLET | Freq: Once | ORAL | Status: AC
  Administered 2015-08-14: 50 mg via ORAL
  Filled 2015-08-14: qty 1

## 2015-08-14 MED ORDER — NAPROXEN 500 MG PO TABS: 500.0000 mg | ORAL_TABLET | Freq: Two times a day (BID) | ORAL | Status: DC

## 2015-08-14 MED ORDER — IBUPROFEN 800 MG PO TABS
800.0000 mg | ORAL_TABLET | Freq: Once | ORAL | Status: AC
  Administered 2015-08-14: 800 mg via ORAL
  Filled 2015-08-14: qty 1

## 2015-08-14 NOTE — ED Provider Notes (Signed)
[facility] Emergency Department Provider Note  ____________________________________________  Time seen: Approximately 11:01 AM  I have reviewed the triage vital signs and the nursing notes.   HISTORY  Chief Complaint Leg Pain    HPI Abigail Cook is a 49 y.o. female patient complaining of 3 days of right hip pain which is radiating down her leg. Patient stated hip is causing atypical gait which is in [place] her leg pain which has internal fixations. Patient denies any provocative incident for her pain. Patient is rating her pain as a 6/10 and describes it as dull to sharp. Patient is taking over-the-counter anti-inflammatory medications without any relief.   History reviewed. No pertinent past medical history.  There are no active problems to display for this patient.   Past Surgical History  Procedure Laterality Date  . Rod placed in right leg/foot      Current Outpatient Rx  Name  Route  Sig  Dispense  Refill  . naproxen (NAPROSYN) 500 MG tablet   Oral   Take 1 tablet (500 mg total) by mouth 2 (two) times daily with a meal.   20 tablet   0     Allergies Review of patient's allergies indicates no known allergies.  No family history on file.  Social History Social History  Substance Use Topics  . Smoking status: Current Every Day Smoker  . Smokeless tobacco: None  . Alcohol Use: Yes    Review of Systems Constitutional: No fever/chills Eyes: No visual changes. ENT: No sore throat. Cardiovascular: Denies chest pain. Respiratory: Denies shortness of breath. Gastrointestinal: No abdominal pain.  No nausea, no vomiting.  No diarrhea.  No constipation. Genitourinary: Negative for dysuria. Musculoskeletal: Right hip pain. Skin: Negative for rash. Neurological: Negative for headaches, focal weakness or numbness. 10-point ROS otherwise negative.  ____________________________________________   PHYSICAL EXAM:  VITAL SIGNS: ED  Triage Vitals  Enc Vitals Group     BP 08/14/15 1049 141/77 mmHg     Pulse Rate 08/14/15 1049 77     Resp 08/14/15 1049 18     Temp 08/14/15 1049 98.3 F (36.8 C)     Temp Source 08/14/15 1049 Oral     SpO2 08/14/15 1049 99 %     Weight 08/14/15 1049 216 lb (97.977 kg)     Height 08/14/15 1049 5\' 2"  (1.575 m)     Head Cir --      Peak Flow --      Pain Score 08/14/15 1029 6     Pain Loc --      Pain Edu? --      Excl. in [place]? --     Constitutional: Alert and oriented. Well appearing and in no acute distress. Eyes: Conjunctivae are normal. PERRL. EOMI. Head: Atraumatic. Nose: No congestion/rhinnorhea. Mouth/Throat: Mucous membranes are moist.  Oropharynx non-erythematous. Neck: No stridor.  No cervical spine tenderness to palpation. Hematological/Lymphatic/Immunilogical: No cervical lymphadenopathy. Cardiovascular: Normal rate, regular rhythm. Grossly normal heart sounds.  Good peripheral circulation. Respiratory: Normal respiratory effort.  No retractions. Lungs CTAB. Gastrointestinal: Soft and nontender. No distention. No abdominal bruits. No CVA tenderness. Musculoskeletal: Obvious deformity of the hip leg length is equal. There is no erythema or ecchymosis. Patient has some moderate guarding with palpation of the greater trochanteric. Neurologic:  Normal speech and language. No gross focal neurologic deficits are appreciated. No gait instability. Skin:  Skin is warm, dry and intact. No rash noted. Psychiatric: Mood and affect are normal. Speech and behavior  are normal.  ____________________________________________   LABS (all labs ordered are listed, but only abnormal results are displayed)  Labs Reviewed - No data to display ____________________________________________  EKG   ____________________________________________  RADIOLOGY  Negative findings on her right hip x-ray. ____________________________________________   PROCEDURES  Procedure(s) performed:  None  Critical Care performed: No  ____________________________________________   INITIAL IMPRESSION / ASSESSMENT AND PLAN / ED COURSE  Pertinent labs & imaging results that were available during my care of the patient were reviewed by me and considered in my medical decision making (see chart for details).  Right hip pain. Discussed x-ray findings with patient. Advised anti-inflammatory medication and [facility]. ____________________________________________   FINAL CLINICAL IMPRESSION(S) / ED DIAGNOSES  Final diagnoses:  Pain aggravated by walking  Right hip pain      Sable Feil, PA-C 08/14/15 1244  Lisa Roca, MD 08/14/15 [phone]

## 2015-08-14 NOTE — Discharge Instructions (Signed)

## 2015-08-14 NOTE — ED Notes (Signed)
Pt presents with right lower leg pain for a few days, hx of rod placed in right leg. Hurts worse when ambulating.

## 2015-11-06 ENCOUNTER — Emergency Department: Payer: Medicaid Other

## 2015-11-06 ENCOUNTER — Inpatient Hospital Stay
Admission: EM | Admit: 2015-11-06 | Discharge: 2015-11-20 | DRG: 432 | Disposition: A | Discharge status: Home Health | Payer: Medicaid Other | Attending: Internal Medicine | Admitting: Internal Medicine

## 2015-11-06 ENCOUNTER — Encounter: Payer: Self-pay | Admitting: Emergency Medicine

## 2015-11-06 DIAGNOSIS — F1020 Alcohol dependence, uncomplicated: Secondary | ICD-10-CM | POA: Diagnosis present

## 2015-11-06 DIAGNOSIS — K7040 Alcoholic hepatic failure without coma: Principal | ICD-10-CM | POA: Diagnosis present

## 2015-11-06 DIAGNOSIS — Y900 Blood alcohol level of less than 20 mg/100 ml: Secondary | ICD-10-CM | POA: Diagnosis present

## 2015-11-06 DIAGNOSIS — E871 Hypo-osmolality and hyponatremia: Secondary | ICD-10-CM | POA: Diagnosis present

## 2015-11-06 DIAGNOSIS — E876 Hypokalemia: Secondary | ICD-10-CM | POA: Diagnosis present

## 2015-11-06 DIAGNOSIS — K7290 Hepatic failure, unspecified without coma: Secondary | ICD-10-CM | POA: Diagnosis present

## 2015-11-06 DIAGNOSIS — K2970 Gastritis, unspecified, without bleeding: Secondary | ICD-10-CM | POA: Diagnosis present

## 2015-11-06 DIAGNOSIS — G40909 Epilepsy, unspecified, not intractable, without status epilepticus: Secondary | ICD-10-CM | POA: Diagnosis present

## 2015-11-06 DIAGNOSIS — D72829 Elevated white blood cell count, unspecified: Secondary | ICD-10-CM | POA: Diagnosis not present

## 2015-11-06 DIAGNOSIS — B3781 Candidal esophagitis: Secondary | ICD-10-CM | POA: Diagnosis not present

## 2015-11-06 DIAGNOSIS — D125 Benign neoplasm of sigmoid colon: Secondary | ICD-10-CM | POA: Diagnosis present

## 2015-11-06 DIAGNOSIS — L89312 Pressure ulcer of right buttock, stage 2: Secondary | ICD-10-CM | POA: Diagnosis not present

## 2015-11-06 DIAGNOSIS — Y929 Unspecified place or not applicable: Secondary | ICD-10-CM | POA: Diagnosis not present

## 2015-11-06 DIAGNOSIS — Z87891 Personal history of nicotine dependence: Secondary | ICD-10-CM

## 2015-11-06 DIAGNOSIS — T39315A Adverse effect of propionic acid derivatives, initial encounter: Secondary | ICD-10-CM | POA: Diagnosis present

## 2015-11-06 DIAGNOSIS — D684 Acquired coagulation factor deficiency: Secondary | ICD-10-CM | POA: Diagnosis present

## 2015-11-06 DIAGNOSIS — I864 Gastric varices: Secondary | ICD-10-CM | POA: Diagnosis present

## 2015-11-06 DIAGNOSIS — R042 Hemoptysis: Secondary | ICD-10-CM

## 2015-11-06 DIAGNOSIS — Z6839 Body mass index (BMI) 39.0-39.9, adult: Secondary | ICD-10-CM

## 2015-11-06 DIAGNOSIS — K920 Hematemesis: Secondary | ICD-10-CM | POA: Diagnosis present

## 2015-11-06 DIAGNOSIS — K767 Hepatorenal syndrome: Secondary | ICD-10-CM | POA: Diagnosis present

## 2015-11-06 DIAGNOSIS — F1410 Cocaine abuse, uncomplicated: Secondary | ICD-10-CM | POA: Diagnosis present

## 2015-11-06 DIAGNOSIS — T380X5A Adverse effect of glucocorticoids and synthetic analogues, initial encounter: Secondary | ICD-10-CM | POA: Diagnosis not present

## 2015-11-06 DIAGNOSIS — K7031 Alcoholic cirrhosis of liver with ascites: Secondary | ICD-10-CM

## 2015-11-06 DIAGNOSIS — K3189 Other diseases of stomach and duodenum: Secondary | ICD-10-CM | POA: Diagnosis present

## 2015-11-06 DIAGNOSIS — K760 Fatty (change of) liver, not elsewhere classified: Secondary | ICD-10-CM | POA: Diagnosis present

## 2015-11-06 DIAGNOSIS — K7010 Alcoholic hepatitis without ascites: Secondary | ICD-10-CM | POA: Diagnosis present

## 2015-11-06 DIAGNOSIS — L8990 Pressure ulcer of unspecified site, unspecified stage: Secondary | ICD-10-CM | POA: Insufficient documentation

## 2015-11-06 DIAGNOSIS — N170 Acute kidney failure with tubular necrosis: Secondary | ICD-10-CM | POA: Diagnosis present

## 2015-11-06 DIAGNOSIS — K766 Portal hypertension: Secondary | ICD-10-CM | POA: Diagnosis present

## 2015-11-06 DIAGNOSIS — D62 Acute posthemorrhagic anemia: Secondary | ICD-10-CM | POA: Diagnosis present

## 2015-11-06 DIAGNOSIS — K921 Melena: Secondary | ICD-10-CM | POA: Diagnosis present

## 2015-11-06 DIAGNOSIS — B1920 Unspecified viral hepatitis C without hepatic coma: Secondary | ICD-10-CM | POA: Diagnosis present

## 2015-11-06 DIAGNOSIS — K7682 Hepatic encephalopathy: Secondary | ICD-10-CM

## 2015-11-06 DIAGNOSIS — E872 Acidosis: Secondary | ICD-10-CM | POA: Diagnosis not present

## 2015-11-06 DIAGNOSIS — E669 Obesity, unspecified: Secondary | ICD-10-CM | POA: Diagnosis present

## 2015-11-06 DIAGNOSIS — N189 Chronic kidney disease, unspecified: Secondary | ICD-10-CM | POA: Diagnosis present

## 2015-11-06 DIAGNOSIS — I8500 Esophageal varices without bleeding: Secondary | ICD-10-CM | POA: Diagnosis present

## 2015-11-06 DIAGNOSIS — N179 Acute kidney failure, unspecified: Secondary | ICD-10-CM

## 2015-11-06 DIAGNOSIS — Z515 Encounter for palliative care: Secondary | ICD-10-CM | POA: Diagnosis not present

## 2015-11-06 DIAGNOSIS — I129 Hypertensive chronic kidney disease with stage 1 through stage 4 chronic kidney disease, or unspecified chronic kidney disease: Secondary | ICD-10-CM | POA: Diagnosis present

## 2015-11-06 DIAGNOSIS — N19 Unspecified kidney failure: Secondary | ICD-10-CM | POA: Diagnosis not present

## 2015-11-06 HISTORY — DX: Unspecified convulsions: R56.9

## 2015-11-06 HISTORY — DX: Essential (primary) hypertension: I10

## 2015-11-06 LAB — BLOOD GAS, ARTERIAL
ACID-BASE DEFICIT: 3.2 mmol/L — AB (ref 0.0–2.0)
ALLENS TEST (PASS/FAIL): POSITIVE — AB
BICARBONATE: 19.5 meq/L — AB (ref 21.0–28.0)
FIO2: 0.21
O2 Saturation: 96.7 %
PCO2 ART: 28 mmHg — AB (ref 32.0–48.0)
PH ART: 7.45 (ref 7.350–7.450)
Patient temperature: 37
pO2, Arterial: 83 mmHg (ref 83.0–108.0)

## 2015-11-06 LAB — TROPONIN I: TROPONIN I: 0.04 ng/mL — AB (ref <0.031)

## 2015-11-06 LAB — URINALYSIS COMPLETE WITH MICROSCOPIC (ARMC ONLY)
PH: 0 — AB (ref 5.0–8.0)
SPECIFIC GRAVITY, URINE: 1.019 (ref 1.005–1.030)

## 2015-11-06 LAB — BASIC METABOLIC PANEL
ANION GAP: 14 (ref 5–15)
BUN: 39 mg/dL — ABNORMAL HIGH (ref 6–20)
CALCIUM: 7.3 mg/dL — AB (ref 8.9–10.3)
CO2: 20 mmol/L — AB (ref 22–32)
CREATININE: UNDETERMINED mg/dL (ref 0.44–1.00)
Chloride: 96 mmol/L — ABNORMAL LOW (ref 101–111)
Glucose, Bld: 109 mg/dL — ABNORMAL HIGH (ref 65–99)
Potassium: 2.8 mmol/L — CL (ref 3.5–5.1)
SODIUM: 130 mmol/L — AB (ref 135–145)

## 2015-11-06 LAB — URINE DRUG SCREEN, QUALITATIVE (ARMC ONLY)
Amphetamines, Ur Screen: NOT DETECTED
BENZODIAZEPINE, UR SCRN: NOT DETECTED
Barbiturates, Ur Screen: NOT DETECTED
CANNABINOID 50 NG, UR ~~LOC~~: NOT DETECTED
Cocaine Metabolite,Ur ~~LOC~~: POSITIVE — AB
MDMA (ECSTASY) UR SCREEN: NOT DETECTED
Methadone Scn, Ur: NOT DETECTED
Opiate, Ur Screen: NOT DETECTED
PHENCYCLIDINE (PCP) UR S: NOT DETECTED
TRICYCLIC, UR SCREEN: NOT DETECTED

## 2015-11-06 LAB — LACTIC ACID, PLASMA: Lactic Acid, Venous: 2.8 mmol/L (ref 0.5–2.0)

## 2015-11-06 LAB — OSMOLALITY: OSMOLALITY: 290 mosm/kg (ref 275–295)

## 2015-11-06 LAB — CBC
HCT: 27.3 % — ABNORMAL LOW (ref 35.0–47.0)
Hemoglobin: 8.8 g/dL — ABNORMAL LOW (ref 12.0–16.0)
MCH: 31.3 pg (ref 26.0–34.0)
MCHC: 32.2 g/dL (ref 32.0–36.0)
MCV: 97.3 fL (ref 80.0–100.0)
PLATELETS: 193 10*3/uL (ref 150–440)
RBC: 2.8 MIL/uL — ABNORMAL LOW (ref 3.80–5.20)
RDW: 28.1 % — AB (ref 11.5–14.5)
WBC: 13.1 10*3/uL — AB (ref 3.6–11.0)

## 2015-11-06 LAB — AMMONIA: AMMONIA: 126 umol/L — AB (ref 9–35)

## 2015-11-06 LAB — PROTIME-INR
INR: 1.99
Prothrombin Time: 22.5 seconds — ABNORMAL HIGH (ref 11.4–15.0)

## 2015-11-06 LAB — ACETAMINOPHEN LEVEL: Acetaminophen (Tylenol), Serum: <10 ug/mL — ABNORMAL LOW (ref 10–30)

## 2015-11-06 LAB — APTT: APTT: 46 s — AB (ref 24–36)

## 2015-11-06 LAB — SALICYLATE LEVEL: Salicylate Lvl: 9.1 mg/dL (ref 2.8–30.0)

## 2015-11-06 LAB — ETHANOL: ALCOHOL ETHYL (B): 5 mg/dL — AB (ref <5)

## 2015-11-06 LAB — LIPASE, BLOOD: Lipase: 25 U/L (ref 11–51)

## 2015-11-06 MED ORDER — LACTULOSE 10 GM/15ML PO SOLN
30.0000 g | Freq: Four times a day (QID) | ORAL | Status: DC
  Administered 2015-11-06 – 2015-11-11 (×19): 30 g via ORAL
  Filled 2015-11-06 (×20): qty 60

## 2015-11-06 MED ORDER — SODIUM CHLORIDE 0.9 % IV SOLN
1500.0000 mg | Freq: Once | INTRAVENOUS | Status: AC
  Administered 2015-11-06: 1500 mg via INTRAVENOUS
  Filled 2015-11-06: qty 1500

## 2015-11-06 MED ORDER — PIPERACILLIN-TAZOBACTAM 3.375 G IVPB 30 MIN
3.3750 g | Freq: Once | INTRAVENOUS | Status: AC
  Administered 2015-11-06: 3.375 g via INTRAVENOUS
  Filled 2015-11-06: qty 50

## 2015-11-06 MED ORDER — SODIUM CHLORIDE 0.9 % IV SOLN
INTRAVENOUS | Status: AC
  Administered 2015-11-06 – 2015-11-07 (×2): via INTRAVENOUS

## 2015-11-06 MED ORDER — LACTULOSE ENEMA
300.0000 mL | Freq: Once | ORAL | Status: AC
  Administered 2015-11-06: 300 mL via RECTAL
  Filled 2015-11-06: qty 300

## 2015-11-06 MED ORDER — SODIUM CHLORIDE 0.9 % IV SOLN
Freq: Once | INTRAVENOUS | Status: AC
  Administered 2015-11-06: 13:00:00 via INTRAVENOUS

## 2015-11-06 MED ORDER — ONDANSETRON HCL 4 MG/2ML IJ SOLN
4.0000 mg | Freq: Once | INTRAMUSCULAR | Status: AC
Start: 2015-11-06 — End: 2015-11-06
  Administered 2015-11-06: 4 mg via INTRAVENOUS
  Filled 2015-11-06: qty 2

## 2015-11-06 MED ORDER — IOHEXOL 240 MG/ML SOLN
50.0000 mL | Freq: Once | INTRAMUSCULAR | Status: AC | PRN
  Administered 2015-11-06: 50 mL via ORAL

## 2015-11-06 MED ORDER — SODIUM CHLORIDE 0.9 % IV SOLN
1250.0000 mg | Freq: Once | INTRAVENOUS | Status: DC
  Filled 2015-11-06: qty 1250

## 2015-11-06 MED ORDER — POTASSIUM CHLORIDE 20 MEQ PO PACK
20.0000 meq | PACK | Freq: Once | ORAL | Status: AC
  Administered 2015-11-06: 20 meq via ORAL
  Filled 2015-11-06: qty 1

## 2015-11-06 MED ORDER — PIPERACILLIN-TAZOBACTAM 3.375 G IVPB
3.3750 g | Freq: Two times a day (BID) | INTRAVENOUS | Status: DC
  Administered 2015-11-07 – 2015-11-08 (×3): 3.375 g via INTRAVENOUS
  Filled 2015-11-06 (×4): qty 50

## 2015-11-06 MED ORDER — SODIUM CHLORIDE 0.9 % IV BOLUS (SEPSIS)
500.0000 mL | Freq: Once | INTRAVENOUS | Status: AC
  Administered 2015-11-06: 500 mL via INTRAVENOUS

## 2015-11-06 MED ORDER — IOHEXOL 240 MG/ML SOLN: 25.0000 mL | Freq: Once | INTRAMUSCULAR | Status: DC | PRN

## 2015-11-06 MED ORDER — PIPERACILLIN-TAZOBACTAM 3.375 G IVPB 30 MIN: 3.3750 g | Freq: Three times a day (TID) | INTRAVENOUS | Status: DC

## 2015-11-06 MED ORDER — FENTANYL CITRATE (PF) 100 MCG/2ML IJ SOLN
25.0000 ug | Freq: Once | INTRAMUSCULAR | Status: AC
  Administered 2015-11-06: 25 ug via INTRAVENOUS
  Filled 2015-11-06: qty 2

## 2015-11-06 NOTE — ED Notes (Signed)
Pt with elevated troponin of 0.04 reported to this RN via phone. Dr Edd Fabian notified of lab value as well as charge RN.

## 2015-11-06 NOTE — ED Notes (Signed)
Pt not able to keep oral K down.

## 2015-11-06 NOTE — ED Notes (Signed)
nsr on monitor.  primedoc in with pt now.

## 2015-11-06 NOTE — ED Notes (Signed)
Pt unable to void.  md aware.  Pt drinking po contrast for ct scan.

## 2015-11-06 NOTE — ED Notes (Signed)
Lab called with potassium of 2.8.  Dr hower aware.

## 2015-11-06 NOTE — ED Provider Notes (Addendum)
[facility] Emergency Department Provider Note  ____________________________________________  Time seen: Approximately 305 PM  I have reviewed the triage vital signs and the nursing notes.   HISTORY  Chief Complaint Abdominal Pain; Chest Pain; and Leg Pain    HPI Abigail Cook is a 49 y.o. female with a history of hypertension and seizures who is presenting today with chest as well as upper abdominal pain. She says that over the past 3 days she has had worsening chest as well as upper abdominal pain which she describes as "stabbing." She says the pain is nonradiating and is an 8 out of 10 constantly. She says there are no exacerbating factors including exertion. She denies any shortness of breath nausea vomiting or diarrhea. She is also noticed yellowing of her eyes over the past 3 days. She denies any lower abdominal pain but does say that she has frequency with urination as well as burning. She is also describing right anterior leg pain which she says is chronic and cramping. She was evaluated for this several weeks ago in the emergency department and discharged. She has not reported any bowel or bladder continence. No numbness or weakness. She says that her last drink was one week ago. She says that she drinks about twice a week about 4 beers every time that she drinks. She denies any drug use. Denies any excessive acetaminophen use but says that she intermittently uses ibuprofen or Naprosyn for pain relief.   Past Medical History  Diagnosis Date  . Hypertension   . Seizures ([place])     There are no active problems to display for this patient.   Past Surgical History  Procedure Laterality Date  . Rod placed in right leg/foot      Current Outpatient Rx  Name  Route  Sig  Dispense  Refill  . naproxen (NAPROSYN) 500 MG tablet   Oral   Take 1 tablet (500 mg total) by mouth 2 (two) times daily with a meal. Patient not taking: Reported on 11/06/2015   20  tablet   0     Allergies Review of patient's allergies indicates no known allergies.  No family history on file.  Social History Social History  Substance Use Topics  . Smoking status: Former Research scientist (life sciences)  . Smokeless tobacco: None  . Alcohol Use: 7.2 oz/week    12 Cans of beer per week     Comment: every other day    Review of Systems Constitutional: No fever/chills Eyes: No visual changes. ENT: No sore throat. Cardiovascular: As above  Respiratory: Denies shortness of breath. Gastrointestinal: No nausea, no vomiting.  No diarrhea.  No constipation. Genitourinary: Negative for dysuria. Musculoskeletal: Negative for back pain. Skin: Negative for rash. Neurological: Negative for headaches, focal weakness or numbness.  10-point ROS otherwise negative.  ____________________________________________   PHYSICAL EXAM:  VITAL SIGNS: ED Triage Vitals  Enc Vitals Group     BP 11/06/15 1250 116/56 mmHg     Pulse Rate 11/06/15 1250 93     Resp 11/06/15 1250 22     Temp 11/06/15 1250 97.7 F (36.5 C)     Temp Source 11/06/15 1250 Oral     SpO2 11/06/15 1250 96 %     Weight 11/06/15 1250 214 lb (97.07 kg)     Height 11/06/15 1250 5\' 2"  (1.575 m)     Head Cir --      Peak Flow --      Pain Score 11/06/15 1251 8  Pain Loc --      Pain Edu? --      Excl. in [place]? --     Constitutional: Alert and oriented. Well appearing and in no acute distress. Eyes: Bilateral scleral icterus. PERRL. EOMI. Head: Atraumatic. Nose: No congestion/rhinnorhea. Mouth/Throat: Mucous membranes are moist.  Oropharynx non-erythematous. Neck: No stridor.   Cardiovascular: Normal rate, regular rhythm. Grossly normal heart sounds.  Good peripheral circulation. Respiratory: Normal respiratory effort.  No retractions. Lungs CTAB. Gastrointestinal: Soft with mild to moderate tenderness diffusely to the abdomen but worse in the right lower quadrant. There is no rebound or guarding. No distention. No  abdominal bruits. No CVA tenderness. Musculoskeletal: No lower extremity tenderness nor edema.  No joint effusions. Neurologic:  Normal speech and language. No gross focal neurologic deficits are appreciated. Positive for asterixis.  Skin:  Skin is warm, dry and intact. No rash noted. Psychiatric: Mood and affect are normal. Speech and behavior are normal.  ____________________________________________   LABS (all labs ordered are listed, but only abnormal results are displayed)  Labs Reviewed  TROPONIN I - Abnormal; Notable for the following:    Troponin I 0.04 (*)    All other components within normal limits  CBC - Abnormal; Notable for the following:    WBC 13.1 (*)    RBC 2.80 (*)    Hemoglobin 8.8 (*)    HCT 27.3 (*)    RDW 28.1 (*)    All other components within normal limits  COMPREHENSIVE METABOLIC PANEL - Abnormal; Notable for the following:    Sodium 130 (*)    Potassium 3.1 (*)    Chloride 92 (*)    CO2 14 (*)    Glucose, Bld 117 (*)    BUN 39 (*)    Creatinine, Ser 6.92 (*)    Calcium 8.0 (*)    Total Protein 9.0 (*)    Albumin 1.9 (*)    AST 130 (*)    Total Bilirubin 27.1 (*)    GFR calc non Af Amer 6 (*)    GFR calc Af Amer 7 (*)    Anion gap 24 (*)    All other components within normal limits  AMMONIA - Abnormal; Notable for the following:    Ammonia 126 (*)    All other components within normal limits  LIPASE, BLOOD  PROTIME-INR  APTT  ACETAMINOPHEN LEVEL  SALICYLATE LEVEL  URINE DRUG SCREEN, QUALITATIVE ([facility] ONLY)  URINALYSIS COMPLETEWITH MICROSCOPIC ([facility] ONLY)   ____________________________________________  EKG  ED ECG REPORT I, Doran Stabler, the attending physician, personally viewed and interpreted this ECG.   Date: 11/06/2015  EKG Time: 1248  Rate: 95  Rhythm: normal sinus rhythm  Axis: Normal axis  Intervals:Prolonged QT  ST&T Change: T wave inversions in V2 and V3. The inversion in V3 is new from previous. There is no ST  elevation or depression.  ____________________________________________  RADIOLOGY  No active cardiopulmonary disease seen on the chest x-ray.  CT the abdomen with Liver cirrhosis. No acute findings. ____________________________________________   PROCEDURES  CRITICAL CARE Performed by: Doran Stabler   Total critical care time: 50minutes  Critical care time was exclusive of separately billable procedures and treating other patients.  Critical care was necessary to treat or prevent imminent or life-threatening deterioration.  Critical care was time spent personally by me on the following activities: development of treatment plan with patient and/or surrogate as well as nursing, discussions with consultants, evaluation of patient's response  to treatment, examination of patient, obtaining history from patient or surrogate, ordering and performing treatments and interventions, ordering and review of laboratory studies, ordering and review of radiographic studies, pulse oximetry and re-evaluation of patient's condition.  Had 1 episode of hypotension to 78 systolic which was responsive to fluids.   ____________________________________________   INITIAL IMPRESSION / ASSESSMENT AND PLAN / ED COURSE  Pertinent labs & imaging results that were available during my care of the patient were reviewed by me and considered in my medical decision making (see chart for details).  ----------------------------------------- 7:02 PM on 11/06/2015 -----------------------------------------  Patient appears to be slightly confused. Likely encephalopathic. Likely etiology of the current condition is a renal failure. However, unclear cause of the renal failure. Possible alcoholic cause of the liver failure.  Patient denies seeing any Tylenol over the past several days. Says that she will take about 1 ibuprofen and one Naprosyn per day.  The history is questionable given the patient's confusion and  elevated ammonia.  Pt aware of need for admission.  Given lactulose.  Signed out to Dr. Margaretmary Eddy.  Foley being placed. Elevated troponin likely 2/2 renal failure.   ____________________________________________   FINAL CLINICAL IMPRESSION(S) / ED DIAGNOSES  Acute kidney failure.  Hyperbilirubinemia.  Hepatic encephalopathy.  Abdominal pain.  Chest pain.     Orbie Pyo, MD 11/06/15 1909  Orbie Pyo, MD 11/06/15 1910

## 2015-11-06 NOTE — Progress Notes (Signed)
ANTIBIOTIC CONSULT NOTE - INITIAL  Pharmacy Consult for Vancomycin  Indication: Severe sepsis   No Known Allergies  Patient Measurements: Height: 5\' 2"  (157.5 cm) Weight: 214 lb (97.07 kg) IBW/kg (Calculated) : 50.1 Adjusted Body Weight:   Vital Signs: Temp: 97.7 F (36.5 C) (11/16 1250) Temp Source: Oral (11/16 1250) BP: 122/66 mmHg (11/16 1920) Pulse Rate: 89 (11/16 1920) Intake/Output from previous day:   Intake/Output from this shift:    Labs:  Recent Labs  11/06/15 1258  WBC 13.1*  HGB 8.8*  PLT 193  CREATININE 6.92*   Estimated Creatinine Clearance: 10.6 mL/min (by C-G formula based on Cr of 6.92). No results for input(s): VANCOTROUGH, VANCOPEAK, VANCORANDOM, GENTTROUGH, GENTPEAK, GENTRANDOM, TOBRATROUGH, TOBRAPEAK, TOBRARND, AMIKACINPEAK, AMIKACINTROU, AMIKACIN in the last 72 hours.   Microbiology: No results found for this or any previous visit (from the past 720 hour(s)).  Medical History: Past Medical History  Diagnosis Date  . Hypertension   . Seizures ([place])     Medications:   (Not in a hospital admission) Scheduled:  . lactulose  30 g Oral Q6H   Assessment: Pharmacy consulted to dose and monitor Vancomycin in this 49 year old woman being treated for sepsis. Patient is in severe AKI and does not have history of any renal impairment per RN.   Goal of Therapy:  Vancomycin trough level 15-20 mcg/ml  Plan:  Expected duration 5 days with resolution of temperature and/or normalization of WBC Follow up culture results   Will dose patient Vancomycin per levels for now do to renal impairment. Will give Vancomycin 1500 mg IV x 1 then will order a random Vancomycin level to be drawn on 11/18 @ 2100 ~48 hours post dose. If level within goal range of 15-20 mcg/ml. Will redose per level. Pharmacy to follow and adjust dosing strategy as appropriate.   Sherlyne Crownover D 11/06/2015,8:44 PM

## 2015-11-06 NOTE — H&P (Signed)
[facility] NAME: Abigail Cook    MR#:  [id number]  DATE OF BIRTH:  12/10/1966  DATE OF ADMISSION:  11/06/2015  PRIMARY CARE PHYSICIAN: No PCP Per Patient   REQUESTING/REFERRING PHYSICIAN: Schaevitz, MD  CHIEF COMPLAINT:   Right upper quadrant abdominal pain and body aches HISTORY OF PRESENT ILLNESS:  Abigail Cook  is a 49 y.o. female with a known history of hypertension and seizures presenting to the ED with a chief complaint of right upper quadrant abdominal pain and body aches. Patient is somewhat lethargic and couldn't provide much history. No family members are available at bedside. Reports right upper quadrant abdominal pain and a 3 to four-day history of nausea and vomiting. Patient was taking ibuprofen and naproxen for abdominal pain and body aches. Denies any history of liver cirrhosis  PAST MEDICAL HISTORY:   Past Medical History  Diagnosis Date  . Hypertension   . Seizures ([place])     PAST SURGICAL HISTOIRY:   Past Surgical History  Procedure Laterality Date  . Rod placed in right leg/foot      SOCIAL HISTORY:   Social History  Substance Use Topics  . Smoking status: Former Research scientist (life sciences)  . Smokeless tobacco: Not on file  . Alcohol Use: 7.2 oz/week    12 Cans of beer per week     Comment: every other day    FAMILY HISTORY:  No family history on file.  DRUG ALLERGIES:  No Known Allergies  REVIEW OF SYSTEMS:  Review of Systems Constitutional: No fever/chills Eyes: No visual changes. No blurry vision ENT: No sore throat. Denies tinnitus Cardiovascular:  denies any chest pain during my exam Respiratory: Denies shortness of breath, cough or hemoptysis. Gastrointestinal:  reporting right-sided abdominal pain, three-day history of  nausea, and  vomiting. No diarrhea. No constipation. Genitourinary: Negative for dysuria. Musculoskeletal: Negative for back pain. reporting body aches  Skin:  Negative for rash. Neurological: Negative for headaches, focal weakness or numbness.  MEDICATIONS AT HOME:   Prior to Admission medications   Medication Sig Start Date End Date Taking? Authorizing Provider  naproxen (NAPROSYN) 500 MG tablet Take 1 tablet (500 mg total) by mouth 2 (two) times daily with a meal. Patient not taking: Reported on 11/06/2015 08/14/15   Sable Feil, PA-C      VITAL SIGNS:  Blood pressure 122/66, pulse 89, temperature 97.7 F (36.5 C), temperature source Oral, resp. rate 19, height 5\' 2"  (1.575 m), weight 97.07 kg (214 lb), SpO2 100 %.  PHYSICAL EXAMINATION:  GENERAL:  49 y.o.-year-old patient lying in the bed with no acute distress.  EYES: Pupils equal, round, reactive to light and accommodation. positive scleral icterus.  HEENT: Head atraumatic, normocephalic. Oropharynx and nasopharynx clear.  NECK:  Supple, no jugular venous distention. No thyroid enlargement, no tenderness.  LUNGS: Normal breath sounds bilaterally, no wheezing, rales,rhonchi or crepitation. No use of accessory muscles of respiration.  CARDIOVASCULAR: S1, S2 normal. No murmurs, rubs, or gallops.  ABDOMEN: Soft,  right upper quadrant is tender, no rebound tenderness,distended. Bowel sounds present. No organomegaly or mass.  EXTREMITIES: No pedal edema, cyanosis, or clubbing.  NEUROLOGIC: Cranial nerves II through XII are intact. Muscle strength 5/5 in all extremities. Sensation intact. Gait not checked.  PSYCHIATRIC: The patient is alert and oriented x 3.  SKIN:  Jaundiced.No obvious rash, lesion, or ulcer.   LABORATORY PANEL:   CBC  Recent Labs Lab 11/06/15 1258  WBC 13.1*  HGB 8.8*  HCT 27.3*  PLT 193   ------------------------------------------------------------------------------------------------------------------  Chemistries   Recent Labs Lab 11/06/15 1258  NA 130*  K 3.1*  CL 92*  CO2 14*  GLUCOSE 117*  BUN 39*  CREATININE 6.92*  CALCIUM 8.0*  AST 130*   ALT 34  ALKPHOS 71  BILITOT 27.1*   ------------------------------------------------------------------------------------------------------------------  Cardiac Enzymes  Recent Labs Lab 11/06/15 1258  TROPONINI 0.04*   ------------------------------------------------------------------------------------------------------------------  RADIOLOGY:  Ct Abdomen Pelvis Wo Contrast  11/06/2015  CLINICAL DATA:  Acute onset of generalized abdominal pain and right leg pain. Nausea and vomiting. Jaundice. Initial encounter. EXAM: CT ABDOMEN AND PELVIS WITHOUT CONTRAST TECHNIQUE: Multidetector CT imaging of the abdomen and pelvis was performed following the standard protocol without IV contrast. COMPARISON:  Abdominal ultrasound performed 07/25/2014 FINDINGS: Minimal bibasilar opacities demonstrate associated bronchiectasis and may reflect mild scarring or possibly chronic sequelae of infection. Scattered coronary artery calcifications are seen. There is a diffusely nodular contour to the liver, with diffusely decreased attenuation, compatible with hepatic cirrhosis and likely regenerative and degenerative nodules. No definite dominant mass is characterized. The spleen is unremarkable in appearance. Stones are seen dependently within the gallbladder. The gallbladder is mildly distended but otherwise unremarkable. The pancreas and adrenal glands are within normal limits. The kidneys are unremarkable in appearance. There is no evidence of hydronephrosis. No renal or ureteral stones are seen. Mild nonspecific perinephric stranding is noted bilaterally. Mild stranding tracks along both paracolic gutters. No free fluid is identified. The small bowel is unremarkable in appearance. The stomach is within normal limits. No acute vascular abnormalities are seen. Mild scattered calcification is noted along the abdominal aorta and its branches. Mild retroperitoneal stranding is nonspecific. The appendix is grossly  unremarkable in appearance. The colon is grossly unremarkable. Mild stranding about the distal sigmoid colon is nonspecific. The bladder is mildly distended and grossly unremarkable. The uterus is within normal limits. The ovaries are grossly symmetric. No suspicious adnexal masses are seen. No inguinal lymphadenopathy is seen. No acute osseous abnormalities are identified. IMPRESSION: 1. No definite acute abnormality seen to explain the patient's symptoms. 2. Minimal bibasilar airspace opacities demonstrate associated bronchiectasis and may reflect mild scarring or possibly chronic sequelae of infection. 3. Mild soft tissue stranding about the distal sigmoid colon, along the retroperitoneum and along the paracolic gutters is nonspecific but may reflect the patient's cirrhosis. 4. Findings of hepatic cirrhosis, with likely regenerative and degenerative nodules. No definite dominant mass seen. 5. Scattered coronary artery calcifications seen. 6. Mild calcification along the abdominal aorta and its branches. Electronically Signed   By: Garald Balding M.D.   On: 11/06/2015 18:39   Dg Chest 2 View  11/06/2015  CLINICAL DATA:  Chest pain, dyspnea for 3 days. EXAM: CHEST  2 VIEW COMPARISON:  July 22, 2014. FINDINGS: The heart size and mediastinal contours are within normal limits. Both lungs are clear. No pneumothorax or pleural effusion is noted. The visualized skeletal structures are unremarkable. IMPRESSION: No active cardiopulmonary disease. Electronically Signed   By: Marijo Conception, M.D.   On: 11/06/2015 13:50    EKG:   Orders placed or performed during the hospital encounter of 11/06/15  . EKG 12-Lead  . EKG 12-Lead  . ED EKG within 10 minutes  . ED EKG within 10 minutes    IMPRESSION AND PLAN:   Assessment and plan  1. Acute kidney injury-can be from ATN from using ibuprofen and naproxen Aggressive hydration with IV fluids avoid nephrotoxins Consult  nephrology - notified  potassium is  normal CT abdomen with no hydronephrosis  2. Hepatorenal syndrome with jaundice,cirrhosis  and hepatic encephalopathy-etiology is unclear at this time provide lactulose and repeat ammonia levels   check hepatitis panel, Tylenol levels and serum alcohol levels GI consult is placed and notified Repeat CMP in a.m.  3. Sepsis possibly with pneumonia   patient meets criteria with leukocytosis and hypotension Provide IV antibiotics Zosyn and vancomycin  4.Hyponatremia and hypokalemia  provide IV fluids and repeat labs in a.m.   5.history of essential hypertension   currently patient is hypotensive Will hold antihypertensives   6. History of seizures Patient is not on any antiseizure medications       All the records are reviewed and case discussed with ED provider. Management plans discussed with the patient, family and they are in agreement.  CODE STATUS: fc  TOTAL critical TIME TAKING CARE OF THIS PATIENT: 50 minutes.    [name] M.D on 11/06/2015 at 9:39 PM  Between 7am to 6pm - Pager - [phone]  After 6pm go to [email or web address] - password [email or web address] [facility]  [facility]  Office  [phone]  CC: Primary care physician; No PCP Per Patient  CT abdomen with no hydronephrosi. CT abdomen with no hydronephrosis ands

## 2015-11-06 NOTE — ED Notes (Signed)
Pt to ed with c/o chest pain, abd pain and right leg pain for three days. Pt noted to have jaundiced colored eyes.

## 2015-11-06 NOTE — ED Notes (Signed)
Lactulose enema was given without difficulty.  Pt passed moderate amount of stool.  Pt alert.  nsr on monitor.  Skin warm and dry.  Iv fluids infusing.

## 2015-11-06 NOTE — ED Notes (Signed)
Lab called with lactic acid of 2.8   Dr. Lavetta Nielsen aware.

## 2015-11-06 NOTE — ED Notes (Signed)
MD at bedside. 

## 2015-11-06 NOTE — ED Notes (Signed)
Patient transported to CT 

## 2015-11-06 NOTE — ED Notes (Signed)
Pt alert and watching tv.   Iv fluids infusing.  Pt waiting on room assignment.

## 2015-11-07 ENCOUNTER — Inpatient Hospital Stay: Payer: Medicaid Other

## 2015-11-07 ENCOUNTER — Encounter: Payer: Self-pay | Admitting: *Deleted

## 2015-11-07 LAB — COMPREHENSIVE METABOLIC PANEL
ALBUMIN: 1.9 g/dL — AB (ref 3.5–5.0)
ALBUMIN: 1.6 g/dL — AB (ref 3.5–5.0)
ALK PHOS: 71 U/L (ref 38–126)
ALK PHOS: 55 U/L (ref 38–126)
ALT: 34 U/L (ref 14–54)
ALT: 29 U/L (ref 14–54)
ANION GAP: 24 — AB (ref 5–15)
ANION GAP: 12 (ref 5–15)
AST: 130 U/L — ABNORMAL HIGH (ref 15–41)
AST: 101 U/L — AB (ref 15–41)
BUN: 39 mg/dL — ABNORMAL HIGH (ref 6–20)
BUN: 38 mg/dL — AB (ref 6–20)
CALCIUM: 8 mg/dL — AB (ref 8.9–10.3)
CO2: 14 mmol/L — AB (ref 22–32)
CO2: 19 mmol/L — AB (ref 22–32)
Calcium: 7.1 mg/dL — ABNORMAL LOW (ref 8.9–10.3)
Chloride: 92 mmol/L — ABNORMAL LOW (ref 101–111)
Chloride: 101 mmol/L (ref 101–111)
Creatinine, Ser: UNDETERMINED mg/dL (ref 0.44–1.00)
Creatinine, Ser: 5.95 mg/dL — ABNORMAL HIGH (ref 0.44–1.00)
GFR calc Af Amer: 9 mL/min — ABNORMAL LOW (ref >60)
GFR calc non Af Amer: 7 mL/min — ABNORMAL LOW (ref >60)
GLUCOSE: 117 mg/dL — AB (ref 65–99)
GLUCOSE: 88 mg/dL (ref 65–99)
POTASSIUM: 3.1 mmol/L — AB (ref 3.5–5.1)
POTASSIUM: 2.9 mmol/L — AB (ref 3.5–5.1)
SODIUM: 130 mmol/L — AB (ref 135–145)
SODIUM: 132 mmol/L — AB (ref 135–145)
TOTAL PROTEIN: 9 g/dL — AB (ref 6.5–8.1)
Total Bilirubin: 27.1 mg/dL (ref 0.3–1.2)
Total Bilirubin: 23.2 mg/dL (ref 0.3–1.2)
Total Protein: 7.1 g/dL (ref 6.5–8.1)

## 2015-11-07 LAB — APTT: aPTT: 44 seconds — ABNORMAL HIGH (ref 24–36)

## 2015-11-07 LAB — CBC
HEMATOCRIT: 21.6 % — AB (ref 35.0–47.0)
HEMOGLOBIN: 7.1 g/dL — AB (ref 12.0–16.0)
MCH: 32.2 pg (ref 26.0–34.0)
MCHC: 33 g/dL (ref 32.0–36.0)
MCV: 97.6 fL (ref 80.0–100.0)
Platelets: 156 10*3/uL (ref 150–440)
RBC: 2.21 MIL/uL — ABNORMAL LOW (ref 3.80–5.20)
RDW: 27.8 % — AB (ref 11.5–14.5)
WBC: 11 10*3/uL (ref 3.6–11.0)

## 2015-11-07 LAB — MAGNESIUM
Magnesium: 1.3 mg/dL — ABNORMAL LOW (ref 1.7–2.4)
Magnesium: 1.4 mg/dL — ABNORMAL LOW (ref 1.7–2.4)

## 2015-11-07 LAB — HEMOGLOBIN AND HEMATOCRIT, BLOOD
HEMATOCRIT: 22.6 % — AB (ref 35.0–47.0)
Hemoglobin: 7.1 g/dL — ABNORMAL LOW (ref 12.0–16.0)

## 2015-11-07 LAB — PROTIME-INR
INR: 1.96
Prothrombin Time: 22.2 seconds — ABNORMAL HIGH (ref 11.4–15.0)

## 2015-11-07 LAB — ABO/RH: ABO/RH(D): O NEG

## 2015-11-07 LAB — SALICYLATE LEVEL: SALICYLATE LVL: UNDETERMINED mg/dL (ref 2.8–30.0)

## 2015-11-07 LAB — AMMONIA
AMMONIA: UNDETERMINED umol/L (ref 9–35)
AMMONIA: 59 umol/L — AB (ref 9–35)

## 2015-11-07 LAB — LACTIC ACID, PLASMA: Lactic Acid, Venous: 1.9 mmol/L (ref 0.5–2.0)

## 2015-11-07 LAB — PREPARE RBC (CROSSMATCH)

## 2015-11-07 LAB — TSH: TSH: 0.449 u[IU]/mL (ref 0.350–4.500)

## 2015-11-07 MED ORDER — METHYLPREDNISOLONE SODIUM SUCC 125 MG IJ SOLR
60.0000 mg | INTRAMUSCULAR | Status: DC
  Administered 2015-11-07 – 2015-11-10 (×4): 60 mg via INTRAVENOUS
  Filled 2015-11-07 (×4): qty 2

## 2015-11-07 MED ORDER — PANTOPRAZOLE SODIUM 40 MG IV SOLR
40.0000 mg | Freq: Two times a day (BID) | INTRAVENOUS | Status: DC
  Administered 2015-11-07 – 2015-11-20 (×26): 40 mg via INTRAVENOUS
  Filled 2015-11-07 (×28): qty 40

## 2015-11-07 MED ORDER — HEPARIN SODIUM (PORCINE) 5000 UNIT/ML IJ SOLN
5000.0000 [IU] | Freq: Three times a day (TID) | INTRAMUSCULAR | Status: DC
  Administered 2015-11-07: 5000 [IU] via SUBCUTANEOUS
  Filled 2015-11-07: qty 1

## 2015-11-07 MED ORDER — MORPHINE SULFATE (PF) 2 MG/ML IV SOLN
2.0000 mg | INTRAVENOUS | Status: DC | PRN
  Administered 2015-11-07 – 2015-11-11 (×6): 2 mg via INTRAVENOUS
  Filled 2015-11-07 (×8): qty 1

## 2015-11-07 MED ORDER — ONDANSETRON HCL 4 MG PO TABS: 4.0000 mg | ORAL_TABLET | Freq: Four times a day (QID) | ORAL | Status: DC | PRN

## 2015-11-07 MED ORDER — ONDANSETRON HCL 4 MG/2ML IJ SOLN: 4.0000 mg | Freq: Four times a day (QID) | INTRAMUSCULAR | Status: DC | PRN

## 2015-11-07 MED ORDER — SODIUM CHLORIDE 0.9 % IV SOLN
Freq: Once | INTRAVENOUS | Status: AC
Start: 2015-11-07 — End: 2015-11-07
  Administered 2015-11-07: 12:00:00 via INTRAVENOUS

## 2015-11-07 MED ORDER — VITAMIN K1 1 MG/0.5ML IJ SOLN
1.0000 mg | Freq: Once | INTRAMUSCULAR | Status: AC
  Administered 2015-11-07: 1 mg via SUBCUTANEOUS
  Filled 2015-11-07 (×2): qty 0.5

## 2015-11-07 MED ORDER — SODIUM CHLORIDE 0.9 % IJ SOLN
3.0000 mL | Freq: Two times a day (BID) | INTRAMUSCULAR | Status: DC
  Administered 2015-11-07 – 2015-11-19 (×21): 3 mL via INTRAVENOUS

## 2015-11-07 MED ORDER — POTASSIUM CHLORIDE 10 MEQ/100ML IV SOLN
10.0000 meq | INTRAVENOUS | Status: AC
  Administered 2015-11-07 (×2): 10 meq via INTRAVENOUS
  Filled 2015-11-07 (×2): qty 100

## 2015-11-07 MED ORDER — POTASSIUM CHLORIDE 2 MEQ/ML IV SOLN
Freq: Once | INTRAVENOUS | Status: AC
  Administered 2015-11-07: 18:00:00 via INTRAVENOUS
  Filled 2015-11-07: qty 1000

## 2015-11-07 NOTE — Progress Notes (Signed)
Arrival Method: via stretcher with ED tech Mental Orientation: A&O Telemetry: MX40-30 Skin: intact, but jaundiced all over, even in eyes, skin verified by [place]. IV: 22g right hand, Normal saline @ 125 Pain:  Right lower quadrant pain, MD paged for pain meds, MD ordered morphine. Tubes: foley catheter in place Safety Measures: Safety Fall Prevention Plan has been given, discussed & signed, non skid socks in place, bed alarm activated. 2A Orientation: Patient has been orientated to the room, unit & staff.   Orders have been reviewed & implemented. Will continue to monitor the patient. Call light has been placed within reach.  Georgian Co, RN

## 2015-11-07 NOTE — Progress Notes (Signed)
Pt complaining of abdominal pain at an 8 out of 10, pt was treated in the ED for this, pt no longer has anything for pain since she has been admitted, MD paged, Dr. Lavetta Nielsen to put in orders for morphine. No other concerns, will continue to monitor. Conley Simmonds, RN

## 2015-11-07 NOTE — Consult Note (Signed)
  Pt seen and examined. Please see D. Martin's notes. 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 INR high. Due to low K and cocaine in her system, EGD cannot be done until K repleted and cocaine completely out of her system.

## 2015-11-07 NOTE — Consult Note (Signed)
[facility] CONSULT NOTE    Date: 11/07/2015                  Patient Name:  Abigail Cook  MRN: [id number]  DOB: [birth year]  Age / Sex: 49 y.o., female         PCP: No PCP Per Patient                 Service Requesting Consult: Dr. Margaretmary Eddy                 Reason for Consult: Acute renal failure.            History of Present Illness: Patient is a 49 y.o. female with a PMHx of hypertension, seizure disorder,alcohol abuse, who was admitted to [facility] on 11/06/2015 for evaluation of abdominal pain, malaise, and body aches.  The patient has history of long-standing alcohol abuse.  She states that she drinks a 12 pack of beer over 2-3 days.e are now asked to evaluate her for multiple metabolic derangements including acute renal failure. Upon presentation creatinine was greater than 6.  It is now down to 5.95 with IV fluid hydration.  Serum sodium is also low at 132 with a potassium of 2.9.  She also has elevated ammonia and very elevated bilirubin of 23.2.  The patient's urine was noted to be very darkwhen visually inspected.he patient was also taking naproxen at home for abdominal pain.  She had CT scan of the abdomen and pelvis which demonstrated cirrhosis of the liver.   Medications: Outpatient medications: Prescriptions prior to admission  Medication Sig Dispense Refill Last Dose  . naproxen (NAPROSYN) 500 MG tablet Take 1 tablet (500 mg total) by mouth 2 (two) times daily with a meal. (Patient not taking: Reported on 11/06/2015) 20 tablet 0     Current medications: Current Facility-Administered Medications  Medication Dose Route Frequency Provider Last Rate Last Dose  . lactulose (CHRONULAC) 10 GM/15ML solution 30 g  30 g Oral Q6H Aruna Gouru, MD   30 g at 11/07/15 1431  . morphine 2 MG/ML injection 2 mg  2 mg Intravenous Q4H PRN Lytle Butte, MD   2 mg at 11/07/15 1432  . ondansetron (ZOFRAN) tablet 4 mg  4 mg Oral Q6H PRN [name], MD       Or  .  ondansetron (ZOFRAN) injection 4 mg  4 mg Intravenous Q6H PRN Aruna Gouru, MD      . pantoprazole (PROTONIX) injection 40 mg  40 mg Intravenous Q12H Theodoro Grist, MD   40 mg at 11/07/15 1141  . piperacillin-tazobactam (ZOSYN) IVPB 3.375 g  3.375 g Intravenous Q12H [name], MD   3.375 g at 11/07/15 [id number]  . sodium chloride 0.9 % injection 3 mL  3 mL Intravenous Q12H [name], MD   3 mL at 11/07/15 1141      Allergies: No Known Allergies    Past Medical History: Past Medical History  Diagnosis Date  . Hypertension   . Seizures ([place])      Past Surgical History: Past Surgical History  Procedure Laterality Date  . Rod placed in right leg/foot       Family History: History reviewed. No pertinent family history.   Social History: Social History   Social History  . Marital Status: Single    Spouse Name: N/A  . Number of Children: N/A  . Years of Education: N/A   Occupational History  . Not on file.  Social History Main Topics  . Smoking status: Former Research scientist (life sciences)  . Smokeless tobacco: Not on file  . Alcohol Use: 7.2 oz/week    12 Cans of beer per week     Comment: every other day  . Drug Use: Not on file  . Sexual Activity: Not on file   Other Topics Concern  . Not on file   Social History Narrative     Review of Systems: Review of Systems  Constitutional: Positive for malaise/fatigue. Negative for fever, chills, weight loss and diaphoresis.  HENT: Negative for ear discharge.   Eyes: Negative for blurred vision and photophobia.  Respiratory: Negative for cough, hemoptysis and sputum production.   Cardiovascular: Negative for chest pain, palpitations and orthopnea.  Gastrointestinal: Positive for nausea, abdominal pain, diarrhea and blood in stool. Negative for heartburn and vomiting.  Genitourinary: Negative for dysuria and urgency.  Musculoskeletal: Positive for myalgias.  Skin: Negative for rash.  Neurological: Negative for dizziness, focal weakness  and headaches.  Endo/Heme/Allergies: Negative for polydipsia. Does not bruise/bleed easily.  Psychiatric/Behavioral: Negative for depression. The patient is not nervous/anxious.      Vital Signs: Blood pressure 102/48, pulse 81, temperature 98.3 F (36.8 C), temperature source Oral, resp. rate 20, height 5\' 2"  (1.575 m), weight 97.07 kg (214 lb), SpO2 100 %.  Weight trends: Filed Weights   11/06/15 1250  Weight: 97.07 kg (214 lb)    Physical Exam: General: Disheveled, resting in bed  Head: Normocephalic, atraumatic.  Eyes: Icterus noted, PEERL  Nose: Mucous membranes moist, not inflammed, nonerythematous.  Throat: Oropharynx nonerythematous, no exudate appreciated.   Neck: No deformities, masses, or tenderness noted.Supple, No carotid Bruits, no JVD.  Lungs:  Normal respiratory effort. Clear to auscultation BL without crackles or wheezes.  Heart: RRR. S1 and S2 normal without gallop, murmur, or rubs.  Abdomen:  BS normoactive. Soft, Nondistended, non-tender.  No masses or organomegaly.  Extremities: No pretibial edema.  Neurologic: A&O X3, Motor strength is 5/5 in the all 4 extremities  Skin: No visible rashes, scars.    Lab results: Basic Metabolic Panel:  Recent Labs Lab 11/06/15 1258 11/06/15 2207 11/07/15 0450  NA 130* 130* 132*  K 3.1* 2.8* 2.9*  CL 92* 96* 101  CO2 14* 20* 19*  GLUCOSE 117* 109* 88  BUN 39* 39* 38*  CREATININE UNABLE TO REPORT DUE TO ICTERIC INTERFERENCE UNABLE TO REPORT DUE TO ICTERUS  5.95*  CALCIUM 8.0* 7.3* 7.1*  MG  --  1.3* 1.4*    Liver Function Tests:  Recent Labs Lab 11/06/15 1258 11/07/15 0450  AST 130* 101*  ALT 34 29  ALKPHOS 71 55  BILITOT 27.1* 23.2*  PROT 9.0* 7.1  ALBUMIN 1.9* 1.6*    Recent Labs Lab 11/06/15 1258  LIPASE 25    Recent Labs Lab 11/06/15 1535 11/07/15 0117 11/07/15 0450  AMMONIA 126* UNABLE TO CALCULATE. 59*    CBC:  Recent Labs Lab 11/06/15 1258 11/07/15 0450 11/07/15 1116  WBC  13.1* 11.0  --   HGB 8.8* 7.1* 7.1*  HCT 27.3* 21.6* 22.6*  MCV 97.3 97.6  --   PLT 193 156  --     Cardiac Enzymes:  Recent Labs Lab 11/06/15 1258  TROPONINI 0.04*    BNP: Invalid input(s): POCBNP  CBG: No results for input(s): GLUCAP in the last 168 hours.  Microbiology: Results for orders placed or performed during the hospital encounter of 11/06/15  Culture, blood (routine x 2)     Status:  None (Preliminary result)   Collection Time: 11/06/15  9:08 PM  Result Value Ref Range Status   Specimen Description BLOOD RIGHT HAND  Final   Special Requests BOTTLES DRAWN AEROBIC AND ANAEROBIC 5ML  Final   Culture NO GROWTH < 24 HOURS  Final   Report Status PENDING  Incomplete  Culture, blood (routine x 2)     Status: None (Preliminary result)   Collection Time: 11/06/15  9:09 PM  Result Value Ref Range Status   Specimen Description BLOOD RIGHT ANTECUBITAL  Final   Special Requests   Final    BOTTLES DRAWN AEROBIC AND ANAEROBIC 10MLANAEROBIC, 14MLAEROBIC   Culture NO GROWTH < 24 HOURS  Final   Report Status PENDING  Incomplete  Urine culture     Status: None (Preliminary result)   Collection Time: 11/07/15  1:30 AM  Result Value Ref Range Status   Specimen Description URINE, CATHETERIZED  Final   Special Requests NONE  Final   Culture NO GROWTH < 12 HOURS  Final   Report Status PENDING  Incomplete    Coagulation Studies:  Recent Labs  11/06/15 1954 11/07/15 0921  LABPROT 22.5* 22.2*  INR 1.99 1.96    Urinalysis:  Recent Labs  11/06/15 1911  COLORURINE BROWN*  LABSPEC 1.019  PHURINE 0.0*  GLUCOSEU TEST NOT REPORTED DUE TO COLOR INTERFERENCE OF URINE PIGMENT*  HGBUR TEST NOT REPORTED DUE TO COLOR INTERFERENCE OF URINE PIGMENT*  BILIRUBINUR TEST NOT REPORTED DUE TO COLOR INTERFERENCE OF URINE PIGMENT*  KETONESUR TEST NOT REPORTED DUE TO COLOR INTERFERENCE OF URINE PIGMENT*  PROTEINUR TEST NOT REPORTED DUE TO COLOR INTERFERENCE OF URINE PIGMENT*  NITRITE TEST  NOT REPORTED DUE TO COLOR INTERFERENCE OF URINE PIGMENT*  LEUKOCYTESUR TEST NOT REPORTED DUE TO COLOR INTERFERENCE OF URINE PIGMENT*      Imaging: Ct Abdomen Pelvis Wo Contrast  11/06/2015  CLINICAL DATA:  Acute onset of generalized abdominal pain and right leg pain. Nausea and vomiting. Jaundice. Initial encounter. EXAM: CT ABDOMEN AND PELVIS WITHOUT CONTRAST TECHNIQUE: Multidetector CT imaging of the abdomen and pelvis was performed following the standard protocol without IV contrast. COMPARISON:  Abdominal ultrasound performed 07/25/2014 FINDINGS: Minimal bibasilar opacities demonstrate associated bronchiectasis and may reflect mild scarring or possibly chronic sequelae of infection. Scattered coronary artery calcifications are seen. There is a diffusely nodular contour to the liver, with diffusely decreased attenuation, compatible with hepatic cirrhosis and likely regenerative and degenerative nodules. No definite dominant mass is characterized. The spleen is unremarkable in appearance. Stones are seen dependently within the gallbladder. The gallbladder is mildly distended but otherwise unremarkable. The pancreas and adrenal glands are within normal limits. The kidneys are unremarkable in appearance. There is no evidence of hydronephrosis. No renal or ureteral stones are seen. Mild nonspecific perinephric stranding is noted bilaterally. Mild stranding tracks along both paracolic gutters. No free fluid is identified. The small bowel is unremarkable in appearance. The stomach is within normal limits. No acute vascular abnormalities are seen. Mild scattered calcification is noted along the abdominal aorta and its branches. Mild retroperitoneal stranding is nonspecific. The appendix is grossly unremarkable in appearance. The colon is grossly unremarkable. Mild stranding about the distal sigmoid colon is nonspecific. The bladder is mildly distended and grossly unremarkable. The uterus is within normal limits.  The ovaries are grossly symmetric. No suspicious adnexal masses are seen. No inguinal lymphadenopathy is seen. No acute osseous abnormalities are identified. IMPRESSION: 1. No definite acute abnormality seen to explain the patient's symptoms. 2.  Minimal bibasilar airspace opacities demonstrate associated bronchiectasis and may reflect mild scarring or possibly chronic sequelae of infection. 3. Mild soft tissue stranding about the distal sigmoid colon, along the retroperitoneum and along the paracolic gutters is nonspecific but may reflect the patient's cirrhosis. 4. Findings of hepatic cirrhosis, with likely regenerative and degenerative nodules. No definite dominant mass seen. 5. Scattered coronary artery calcifications seen. 6. Mild calcification along the abdominal aorta and its branches. Electronically Signed   By: Garald Balding M.D.   On: 11/06/2015 18:39   Dg Chest 2 View  11/06/2015  CLINICAL DATA:  Chest pain, dyspnea for 3 days. EXAM: CHEST  2 VIEW COMPARISON:  July 22, 2014. FINDINGS: The heart size and mediastinal contours are within normal limits. Both lungs are clear. No pneumothorax or pleural effusion is noted. The visualized skeletal structures are unremarkable. IMPRESSION: No active cardiopulmonary disease. Electronically Signed   By: Marijo Conception, M.D.   On: 11/06/2015 13:50   US Abdomen Limited Ruq  11/07/2015  CLINICAL DATA:  Hepatic encephalopathy.  Jaundiced. EXAM: US ABDOMEN LIMITED - RIGHT UPPER QUADRANT COMPARISON:  CT scan 11/06/2015 FINDINGS: Gallbladder: Moderate distention of the gallbladder and numerous small gallstones. No wall thickening or pericholecystic fluid. Negative sonographic Murphy sign. Common bile duct: Diameter: 3.2 mm Liver: Severe changes of cirrhosis and fatty infiltration. No focal hepatic lesions are identified. Reversal of flow in the main portal vein suggesting severe portal hypertension. No obvious ascites. IMPRESSION: 1. Distended gallbladder with  numerous small gallstones. No wall thickening, pericholecystic fluid or sonographic Murphy sign to suggest acute cholecystitis. 2. Normal caliber common bile duct. 3. Severe cirrhotic changes and fatty infiltration of the liver. 4. Reversal of flow in the main portal vein consistent with severe portal hypertension. Electronically Signed   By: Marijo Sanes M.D.   On: 11/07/2015 10:32      Assessment & Plan: Pt is a 49 y.o. female with a PMHx of hypertension, seizure disorder,alcohol abuse, who was admitted to [facility] on 11/06/2015 for evaluation of abdominal pain, malaise, and body aches.  1.  Acute renal failure. 2.  Hyponatremia. 3.  Hypokalemia. 4.  Liver cirrhosis. 5.  Hyperbilirubinemia. 6.  Anemia unspecified with melena.  Plan: The patient presents with a very severe illness.  Her underlying tissue appears to be liver cirrhosis which is lead to a number of complications.  Her acute renal failure is likely multifactorial and related to blood loss, NSAID use. The patient does appear to be making urine. Will start on a banana bag fortified with KCL as well.  No urgent indication for dialysis though this may need to be considered if renal function worsens.

## 2015-11-07 NOTE — Consult Note (Signed)
Reason for Consult: GI Bleed and Hepatic Encephalopathy Referring Physician: Dr. Theodoro Grist  Abigail Cook is an 49 y.o. female with a known history of Cirrhosis.   HPI:  Patient gives the history of right upper quadrant and epigastric pain for 3 days accompanied by nausea, vomiting and dark stools.  These symptoms worsened causing her to present to the ER.  She admits to taking 3-4 Aleve tablets daily for her abdominal pain. Normally drinks a 12 pack of beer weekly and last used cocaine one week ago.   Urine drug screen positive for Cocaine. Serum Ethanol level 5.   She has been admitted with Acute kidney injury, Hepatorenal syndrome with jaundice, cirrhosis and hepatic encephalopathy. Improvement in mental status with Lactulose lowering ammonia level from 126 to 59 today.  She remains jaundice. Electrolyte imbalances with a potassium level of 2.9.  BUN 38 and Cr 5.95.  Nephrology has been consulted.  Hemoglobin 8.8 on yesterday. Now 7.1.  Dark stools noted per staff. Agree with Protonix 40 mg IV every 12 hours. No varices noted on last EGD per Dr. Rayann Heman in August of 2015.   She was seen by Dr. Rayann Heman during 07/22/14-07/26/14 admission for severe anemia.   An EGD on 07/26/14 revealed Pigmented spot at GE junction. Doubt it was source of IDA. Gastritis. Normal examined duodenum. Recommended Protonix 40 mg bid x 8 weeks and then daily. If IDA continued, Repeat EGD for treatment of pigmented spot at GE junction.  A colonoscopy was also performed. One 4 mm polyp in the sigmoid colon. The examination was otherwise normal. Pathology resulted in Tubular Adenoma. Negative for high grade dysplasia and malignancy.  Capsule endoscopy recommended but not performed then due to epistaxis during the procedure.   Hepatic cirrhosis was noted on 07/26/15 [place]. No ascites. Clinic follow up recommended. She admits to not following up or has she been seen by another Copywriter, advertising.    Past Medical History   Diagnosis Date  . Hypertension   . Seizures [facility])     Past Surgical History  Procedure Laterality Date  . Rod placed in right leg/foot      History reviewed. No pertinent family history.  Social History:  reports that she has quit smoking. She does not have any smokeless tobacco history on file. She reports that she drinks about 7.2 oz of alcohol per week. Her drug history is not on file.  Allergies: No Known Allergies  Medications:  I have reviewed the patient's current medications. Prior to Admission:  Prescriptions prior to admission  Medication Sig Dispense Refill Last Dose  . naproxen (NAPROSYN) 500 MG tablet Take 1 tablet (500 mg total) by mouth 2 (two) times daily with a meal. (Patient not taking: Reported on 11/06/2015) 20 tablet 0    Scheduled: . lactulose  30 g Oral Q6H  . pantoprazole (PROTONIX) IV  40 mg Intravenous Q12H  . piperacillin-tazobactam (ZOSYN)  IV  3.375 g Intravenous Q12H  . sodium chloride  3 mL Intravenous Q12H   Continuous:  DDU:KGURKYHC injection, ondansetron **OR** ondansetron (ZOFRAN) IV Anti-infectives    Start     Dose/Rate Route Frequency Ordered Stop   11/07/15 0900  piperacillin-tazobactam (ZOSYN) IVPB 3.375 g     3.375 g 12.5 mL/hr over 240 Minutes Intravenous Every 12 hours 11/06/15 2036     11/06/15 2200  piperacillin-tazobactam (ZOSYN) IVPB 3.375 g  Status:  Discontinued     3.375 g 100 mL/hr over 30 Minutes Intravenous 3 times per day  11/06/15 2010 11/06/15 2036   11/06/15 2100  vancomycin (VANCOCIN) 1,500 mg in sodium chloride 0.9 % 500 mL IVPB     1,500 mg 250 mL/hr over 120 Minutes Intravenous  Once 11/06/15 2045 11/07/15 0006   11/06/15 2045  piperacillin-tazobactam (ZOSYN) IVPB 3.375 g     3.375 g 100 mL/hr over 30 Minutes Intravenous  Once 11/06/15 2036 11/06/15 2205   11/06/15 2045  vancomycin (VANCOCIN) 1,250 mg in sodium chloride 0.9 % 250 mL IVPB  Status:  Discontinued     1,250 mg 166.7 mL/hr over 90 Minutes  Intravenous  Once 11/06/15 2043 11/06/15 2045      Results for orders placed or performed during the hospital encounter of 11/06/15 (from the past 48 hour(s))  Troponin I     Status: Abnormal   Collection Time: 11/06/15 12:58 PM  Result Value Ref Range   Troponin I 0.04 (H) <0.031 ng/mL    Comment:        PERSISTENTLY INCREASED TROPONIN VALUES IN THE RANGE OF 0.04-0.49 ng/mL CAN BE SEEN IN:       -UNSTABLE ANGINA       -CONGESTIVE HEART FAILURE       -MYOCARDITIS       -CHEST TRAUMA       -ARRYHTHMIAS       -LATE PRESENTING MYOCARDIAL INFARCTION       -COPD   CLINICAL FOLLOW-UP RECOMMENDED. CRITICAL RESULT CALLED TO, READ BACK BY AND VERIFIED WITH: MONICA MOON  ON 11/06/15 AT 1409 BY TB   CBC     Status: Abnormal   Collection Time: 11/06/15 12:58 PM  Result Value Ref Range   WBC 13.1 (H) 3.6 - 11.0 K/uL   RBC 2.80 (L) 3.80 - 5.20 MIL/uL   Hemoglobin 8.8 (L) 12.0 - 16.0 g/dL   HCT 27.3 (L) 35.0 - 47.0 %   MCV 97.3 80.0 - 100.0 fL   MCH 31.3 26.0 - 34.0 pg   MCHC 32.2 32.0 - 36.0 g/dL   RDW 28.1 (H) 11.5 - 14.5 %   Platelets 193 150 - 440 K/uL  Lipase, blood     Status: None   Collection Time: 11/06/15 12:58 PM  Result Value Ref Range   Lipase 25 11 - 51 U/L  Comprehensive metabolic panel     Status: Abnormal   Collection Time: 11/06/15 12:58 PM  Result Value Ref Range   Sodium 130 (L) 135 - 145 mmol/L   Potassium 3.1 (L) 3.5 - 5.1 mmol/L   Chloride 92 (L) 101 - 111 mmol/L   CO2 14 (L) 22 - 32 mmol/L   Glucose, Bld 117 (H) 65 - 99 mg/dL   BUN 39 (H) 6 - 20 mg/dL   Creatinine, Ser UNABLE TO REPORT DUE TO ICTERIC INTERFERENCE 0.44 - 1.00 mg/dL    Comment: NOTIFIED DENISE FREEMAN AT 1219 11.17.16 DAS CORRECTED ON 11/17 AT 1219: PREVIOUSLY REPORTED AS 6.92    Calcium 8.0 (L) 8.9 - 10.3 mg/dL   Total Protein 9.0 (H) 6.5 - 8.1 g/dL   Albumin 1.9 (L) 3.5 - 5.0 g/dL   AST 130 (H) 15 - 41 U/L   ALT 34 14 - 54 U/L   Alkaline Phosphatase 71 38 - 126 U/L   Total Bilirubin  27.1 (HH) 0.3 - 1.2 mg/dL    Comment: CRITICAL RESULT CALLED TO, READ BACK BY AND VERIFIED WITH: RACHEL VERDI ON 11/06/15 AT 1530 BY TB.    GFR calc non Af Amer NOT CALCULATED >  60 mL/min    Comment: CORRECTED ON 11/17 AT 1219: PREVIOUSLY REPORTED AS 6   GFR calc Af Amer NOT CALCULATED >60 mL/min    Comment: (NOTE) The eGFR has been calculated using the CKD EPI equation. This calculation has not been validated in all clinical situations. eGFR's persistently <60 mL/min signify possible Chronic Kidney Disease. CORRECTED ON 11/17 AT 1219: PREVIOUSLY REPORTED AS 7    Anion gap 24 (H) 5 - 15  Type and screen Circle     Status: None (Preliminary result)   Collection Time: 11/06/15  2:50 PM  Result Value Ref Range   ABO/RH(D) O NEG    Antibody Screen NEG    Sample Expiration 11/09/2015    Unit Number [id number]    Blood Component Type RBC, LR IRR    Unit division 00    Status of Unit ISSUED    Transfusion Status OK TO TRANSFUSE    Crossmatch Result Compatible    Unit Number [id number]    Blood Component Type RBC, LR IRR    Unit division 00    Status of Unit ALLOCATED    Transfusion Status OK TO TRANSFUSE    Crossmatch Result Compatible   ABO/Rh     Status: None   Collection Time: 11/06/15  2:50 PM  Result Value Ref Range   ABO/RH(D) O NEG   Acetaminophen level     Status: Abnormal   Collection Time: 11/06/15  3:30 PM  Result Value Ref Range   Acetaminophen (Tylenol), Serum <10 (L) 10 - 30 ug/mL    Comment:        THERAPEUTIC CONCENTRATIONS VARY SIGNIFICANTLY. A RANGE OF 10-30 ug/mL MAY BE AN EFFECTIVE CONCENTRATION FOR MANY PATIENTS. HOWEVER, SOME ARE BEST TREATED AT CONCENTRATIONS OUTSIDE THIS RANGE. ACETAMINOPHEN CONCENTRATIONS >150 ug/mL AT 4 HOURS AFTER INGESTION AND >50 ug/mL AT 12 HOURS AFTER INGESTION ARE OFTEN ASSOCIATED WITH TOXIC REACTIONS.   Salicylate level     Status: None   Collection Time: 11/06/15  3:30 PM  Result Value  Ref Range   Salicylate Lvl UNABLE TO REPORT DUE TO ICTERIC INTERFERENCE 2.8 - 30.0 mg/dL    Comment: CORRECTED ON 11/17 AT 1146: PREVIOUSLY REPORTED AS 9.0  Ammonia     Status: Abnormal   Collection Time: 11/06/15  3:35 PM  Result Value Ref Range   Ammonia 126 (H) 9 - 35 umol/L    Comment: ICTERUS AT THIS LEVEL MAY AFFECT RESULT  Urine Drug Screen, Qualitative ([place] only)     Status: Abnormal   Collection Time: 11/06/15  7:11 PM  Result Value Ref Range   Tricyclic, Ur Screen NONE DETECTED NONE DETECTED   Amphetamines, Ur Screen NONE DETECTED NONE DETECTED   MDMA (Ecstasy)Ur Screen NONE DETECTED NONE DETECTED   Cocaine Metabolite,Ur [place] POSITIVE (A) NONE DETECTED   Opiate, Ur Screen NONE DETECTED NONE DETECTED   Phencyclidine (PCP) Ur S NONE DETECTED NONE DETECTED   Cannabinoid 50 Ng, Ur  NONE DETECTED NONE DETECTED   Barbiturates, Ur Screen NONE DETECTED NONE DETECTED   Benzodiazepine, Ur Scrn NONE DETECTED NONE DETECTED   Methadone Scn, Ur NONE DETECTED NONE DETECTED    Comment: (NOTE) 025  Tricyclics, urine               Cutoff 1000 ng/mL 200  Amphetamines, urine             Cutoff 1000 ng/mL 300  MDMA (Ecstasy), urine  Cutoff 500 ng/mL 400  Cocaine Metabolite, urine       Cutoff 300 ng/mL 500  Opiate, urine                   Cutoff 300 ng/mL 600  Phencyclidine (PCP), urine      Cutoff 25 ng/mL 700  Cannabinoid, urine              Cutoff 50 ng/mL 800  Barbiturates, urine             Cutoff 200 ng/mL 900  Benzodiazepine, urine           Cutoff 200 ng/mL 1000 Methadone, urine                Cutoff 300 ng/mL 1100 1200 The urine drug screen provides only a preliminary, unconfirmed 1300 analytical test result and should not be used for non-medical 1400 purposes. Clinical consideration and professional judgment should 1500 be applied to any positive drug screen result due to possible 1600 interfering substances. A more specific alternate chemical method 1700 must be used  in order to obtain a confirmed analytical result.  1800 Gas chromato graphy / mass spectrometry (GC/MS) is the preferred 1900 confirmatory method.   Urinalysis complete, with microscopic ([facility] only)     Status: Abnormal   Collection Time: 11/06/15  7:11 PM  Result Value Ref Range   Color, Urine BROWN (A) YELLOW   APPearance CLOUDY (A) CLEAR   Glucose, UA (A) NEGATIVE mg/dL    TEST NOT REPORTED DUE TO COLOR INTERFERENCE OF URINE PIGMENT   Bilirubin Urine (A) NEGATIVE    TEST NOT REPORTED DUE TO COLOR INTERFERENCE OF URINE PIGMENT   Ketones, ur (A) NEGATIVE mg/dL    TEST NOT REPORTED DUE TO COLOR INTERFERENCE OF URINE PIGMENT   Specific Gravity, Urine 1.019 1.005 - 1.030   Hgb urine dipstick (A) NEGATIVE    TEST NOT REPORTED DUE TO COLOR INTERFERENCE OF URINE PIGMENT   pH 0.0 (L) 5.0 - 8.0    Comment: [facility] TEST NOT REPORTED DUE TO COLOR INTERFERENCE   Protein, ur (A) NEGATIVE mg/dL    TEST NOT REPORTED DUE TO COLOR INTERFERENCE OF URINE PIGMENT   Nitrite (A) NEGATIVE    TEST NOT REPORTED DUE TO COLOR INTERFERENCE OF URINE PIGMENT   Leukocytes, UA (A) NEGATIVE    TEST NOT REPORTED DUE TO COLOR INTERFERENCE OF URINE PIGMENT   RBC / HPF TOO NUMEROUS TO COUNT 0 - 5 RBC/hpf   WBC, UA 6-30 0 - 5 WBC/hpf   Bacteria, UA MANY (A) NONE SEEN   Squamous Epithelial / LPF 6-30 (A) NONE SEEN   Mucous PRESENT   Acetaminophen level     Status: Abnormal   Collection Time: 11/06/15  7:54 PM  Result Value Ref Range   Acetaminophen (Tylenol), Serum <10 (L) 10 - 30 ug/mL    Comment:        THERAPEUTIC CONCENTRATIONS VARY SIGNIFICANTLY. A RANGE OF 10-30 ug/mL MAY BE AN EFFECTIVE CONCENTRATION FOR MANY PATIENTS. HOWEVER, SOME ARE BEST TREATED AT CONCENTRATIONS OUTSIDE THIS RANGE. ACETAMINOPHEN CONCENTRATIONS >150 ug/mL AT 4 HOURS AFTER INGESTION AND >50 ug/mL AT 12 HOURS AFTER INGESTION ARE OFTEN ASSOCIATED WITH TOXIC REACTIONS.   Ethanol     Status: Abnormal   Collection Time: 11/06/15   7:54 PM  Result Value Ref Range   Alcohol, Ethyl (B) 5 (H) <5 mg/dL    Comment:        LOWEST DETECTABLE  LIMIT FOR SERUM ALCOHOL IS 5 mg/dL FOR MEDICAL PURPOSES ONLY   Protime-INR     Status: Abnormal   Collection Time: 11/06/15  7:54 PM  Result Value Ref Range   Prothrombin Time 22.5 (H) 11.4 - 15.0 seconds   INR 1.99   APTT     Status: Abnormal   Collection Time: 11/06/15  7:54 PM  Result Value Ref Range   aPTT 46 (H) 24 - 36 seconds    Comment:        IF BASELINE aPTT IS ELEVATED, SUGGEST PATIENT RISK ASSESSMENT BE USED TO DETERMINE APPROPRIATE ANTICOAGULANT THERAPY.   Salicylate level     Status: None   Collection Time: 11/06/15  7:54 PM  Result Value Ref Range   Salicylate Lvl 9.1 2.8 - 30.0 mg/dL  Culture, blood (routine x 2)     Status: None (Preliminary result)   Collection Time: 11/06/15  9:08 PM  Result Value Ref Range   Specimen Description BLOOD RIGHT HAND    Special Requests BOTTLES DRAWN AEROBIC AND ANAEROBIC 5ML    Culture NO GROWTH < 24 HOURS    Report Status PENDING   Culture, blood (routine x 2)     Status: None (Preliminary result)   Collection Time: 11/06/15  9:09 PM  Result Value Ref Range   Specimen Description BLOOD RIGHT ANTECUBITAL    Special Requests      BOTTLES DRAWN AEROBIC AND ANAEROBIC 10MLANAEROBIC, 14MLAEROBIC   Culture NO GROWTH < 24 HOURS    Report Status PENDING   Blood gas, arterial     Status: Abnormal   Collection Time: 11/06/15 10:02 PM  Result Value Ref Range   FIO2 0.21    pH, Arterial 7.45 7.350 - 7.450   pCO2 arterial 28 (L) 32.0 - 48.0 mmHg   pO2, Arterial 83 83.0 - 108.0 mmHg   Bicarbonate 19.5 (L) 21.0 - 28.0 mEq/L   Acid-base deficit 3.2 (H) 0.0 - 2.0 mmol/L   O2 Saturation 96.7 %   Patient temperature 37.0    Collection site RIGHT RADIAL    Sample type ARTERIAL DRAW    Allens test (pass/fail) POSITIVE (A) PASS  Osmolality     Status: None   Collection Time: 11/06/15 10:07 PM  Result Value Ref Range    Osmolality 290 275 - 295 mOsm/kg  Lactic acid, plasma     Status: Abnormal   Collection Time: 11/06/15 10:07 PM  Result Value Ref Range   Lactic Acid, Venous 2.8 (HH) 0.5 - 2.0 mmol/L    Comment: CRITICAL RESULT CALLED TO, READ BACK BY AND VERIFIED WITH AMY COYNE AT 2251 11/06/2015 BY TFK   Basic metabolic panel     Status: Abnormal   Collection Time: 11/06/15 10:07 PM  Result Value Ref Range   Sodium 130 (L) 135 - 145 mmol/L   Potassium 2.8 (LL) 3.5 - 5.1 mmol/L    Comment: CRITICAL RESULT CALLED TO, READ BACK BY AND VERIFIED WITH AMY COYNE AT 2251 11/06/2015 BY TFK    Chloride 96 (L) 101 - 111 mmol/L   CO2 20 (L) 22 - 32 mmol/L   Glucose, Bld 109 (H) 65 - 99 mg/dL   BUN 39 (H) 6 - 20 mg/dL   Creatinine, Ser UNABLE TO REPORT DUE TO ICTERUS  0.44 - 1.00 mg/dL   Calcium 7.3 (L) 8.9 - 10.3 mg/dL   GFR calc non Af Amer NOT CALCULATED >60 mL/min   GFR calc Af Amer NOT CALCULATED >60 mL/min  Anion gap 14 5 - 15  Magnesium     Status: Abnormal   Collection Time: 11/06/15 10:07 PM  Result Value Ref Range   Magnesium 1.3 (L) 1.7 - 2.4 mg/dL  Ammonia     Status: None   Collection Time: 11/07/15  1:17 AM  Result Value Ref Range   Ammonia UNABLE TO CALCULATE. 9 - 35 umol/L    Comment: DUE TO ICTERUS INTERFERENCE  Lactic acid, plasma     Status: None   Collection Time: 11/07/15  1:17 AM  Result Value Ref Range   Lactic Acid, Venous 1.9 0.5 - 2.0 mmol/L  Urine culture     Status: None (Preliminary result)   Collection Time: 11/07/15  1:30 AM  Result Value Ref Range   Specimen Description URINE, CATHETERIZED    Special Requests NONE    Culture NO GROWTH < 12 HOURS    Report Status PENDING   Ammonia     Status: Abnormal   Collection Time: 11/07/15  4:50 AM  Result Value Ref Range   Ammonia 59 (H) 9 - 35 umol/L    Comment: ICTERUS AT THIS LEVEL MAY AFFECT RESULT  TSH     Status: None   Collection Time: 11/07/15  4:50 AM  Result Value Ref Range   TSH 0.449 0.350 - 4.500 uIU/mL     Comment: ICTERUS AT THIS LEVEL MAY AFFECT RESULT  Comprehensive metabolic panel     Status: Abnormal   Collection Time: 11/07/15  4:50 AM  Result Value Ref Range   Sodium 132 (L) 135 - 145 mmol/L   Potassium 2.9 (LL) 3.5 - 5.1 mmol/L    Comment: CRITICAL RESULT CALLED TO, READ BACK BY AND VERIFIED WITH LEXI MILLER AT 6734 ON 11/07/15 BY QSD    Chloride 101 101 - 111 mmol/L   CO2 19 (L) 22 - 32 mmol/L   Glucose, Bld 88 65 - 99 mg/dL   BUN 38 (H) 6 - 20 mg/dL   Creatinine, Ser 5.95 (H) 0.44 - 1.00 mg/dL    Comment: ICTERUS AT THIS LEVEL MAY AFFECT RESULT   Calcium 7.1 (L) 8.9 - 10.3 mg/dL   Total Protein 7.1 6.5 - 8.1 g/dL   Albumin 1.6 (L) 3.5 - 5.0 g/dL   AST 101 (H) 15 - 41 U/L   ALT 29 14 - 54 U/L    Comment: ICTERUS AT THIS LEVEL MAY AFFECT RESULT   Alkaline Phosphatase 55 38 - 126 U/L   Total Bilirubin 23.2 (HH) 0.3 - 1.2 mg/dL    Comment: CRITICAL RESULT CALLED TO, READ BACK BY AND VERIFIED WITH LEXI MILLER ON 11/07/15 AT 0626 BY QSD    GFR calc non Af Amer 7 (L) >60 mL/min   GFR calc Af Amer 9 (L) >60 mL/min    Comment: (NOTE) The eGFR has been calculated using the CKD EPI equation. This calculation has not been validated in all clinical situations. eGFR's persistently <60 mL/min signify possible Chronic Kidney Disease.    Anion gap 12 5 - 15  CBC     Status: Abnormal   Collection Time: 11/07/15  4:50 AM  Result Value Ref Range   WBC 11.0 3.6 - 11.0 K/uL   RBC 2.21 (L) 3.80 - 5.20 MIL/uL   Hemoglobin 7.1 (L) 12.0 - 16.0 g/dL   HCT 21.6 (L) 35.0 - 47.0 %   MCV 97.6 80.0 - 100.0 fL   MCH 32.2 26.0 - 34.0 pg   MCHC 33.0 32.0 -  36.0 g/dL   RDW 27.8 (H) 11.5 - 14.5 %   Platelets 156 150 - 440 K/uL  Magnesium     Status: Abnormal   Collection Time: 11/07/15  4:50 AM  Result Value Ref Range   Magnesium 1.4 (L) 1.7 - 2.4 mg/dL  APTT     Status: Abnormal   Collection Time: 11/07/15  9:21 AM  Result Value Ref Range   aPTT 44 (H) 24 - 36 seconds    Comment:         IF BASELINE aPTT IS ELEVATED, SUGGEST PATIENT RISK ASSESSMENT BE USED TO DETERMINE APPROPRIATE ANTICOAGULANT THERAPY.   Protime-INR     Status: Abnormal   Collection Time: 11/07/15  9:21 AM  Result Value Ref Range   Prothrombin Time 22.2 (H) 11.4 - 15.0 seconds   INR 1.96   Hemoglobin and Hematocrit     Status: Abnormal   Collection Time: 11/07/15 11:16 AM  Result Value Ref Range   Hemoglobin 7.1 (L) 12.0 - 16.0 g/dL   HCT 22.6 (L) 35.0 - 47.0 %  Prepare RBC     Status: None   Collection Time: 11/07/15 11:22 AM  Result Value Ref Range   Order Confirmation ORDER PROCESSED BY BLOOD BANK     Ct Abdomen Pelvis Wo Contrast  11/06/2015  CLINICAL DATA:  Acute onset of generalized abdominal pain and right leg pain. Nausea and vomiting. Jaundice. Initial encounter. EXAM: CT ABDOMEN AND PELVIS WITHOUT CONTRAST TECHNIQUE: Multidetector CT imaging of the abdomen and pelvis was performed following the standard protocol without IV contrast. COMPARISON:  Abdominal ultrasound performed 07/25/2014 FINDINGS: Minimal bibasilar opacities demonstrate associated bronchiectasis and may reflect mild scarring or possibly chronic sequelae of infection. Scattered coronary artery calcifications are seen. There is a diffusely nodular contour to the liver, with diffusely decreased attenuation, compatible with hepatic cirrhosis and likely regenerative and degenerative nodules. No definite dominant mass is characterized. The spleen is unremarkable in appearance. Stones are seen dependently within the gallbladder. The gallbladder is mildly distended but otherwise unremarkable. The pancreas and adrenal glands are within normal limits. The kidneys are unremarkable in appearance. There is no evidence of hydronephrosis. No renal or ureteral stones are seen. Mild nonspecific perinephric stranding is noted bilaterally. Mild stranding tracks along both paracolic gutters. No free fluid is identified. The small bowel is unremarkable  in appearance. The stomach is within normal limits. No acute vascular abnormalities are seen. Mild scattered calcification is noted along the abdominal aorta and its branches. Mild retroperitoneal stranding is nonspecific. The appendix is grossly unremarkable in appearance. The colon is grossly unremarkable. Mild stranding about the distal sigmoid colon is nonspecific. The bladder is mildly distended and grossly unremarkable. The uterus is within normal limits. The ovaries are grossly symmetric. No suspicious adnexal masses are seen. No inguinal lymphadenopathy is seen. No acute osseous abnormalities are identified. IMPRESSION: 1. No definite acute abnormality seen to explain the patient's symptoms. 2. Minimal bibasilar airspace opacities demonstrate associated bronchiectasis and may reflect mild scarring or possibly chronic sequelae of infection. 3. Mild soft tissue stranding about the distal sigmoid colon, along the retroperitoneum and along the paracolic gutters is nonspecific but may reflect the patient's cirrhosis. 4. Findings of hepatic cirrhosis, with likely regenerative and degenerative nodules. No definite dominant mass seen. 5. Scattered coronary artery calcifications seen. 6. Mild calcification along the abdominal aorta and its branches. Electronically Signed   By: Garald Balding M.D.   On: 11/06/2015 18:39   Dg Chest 2 View  11/06/2015  CLINICAL DATA:  Chest pain, dyspnea for 3 days. EXAM: CHEST  2 VIEW COMPARISON:  July 22, 2014. FINDINGS: The heart size and mediastinal contours are within normal limits. Both lungs are clear. No pneumothorax or pleural effusion is noted. The visualized skeletal structures are unremarkable. IMPRESSION: No active cardiopulmonary disease. Electronically Signed   By: Marijo Conception, M.D.   On: 11/06/2015 13:50   US Abdomen Limited Ruq-Findings: coarse, nodular, echogenic. Appears to have reversed flow in MPV due to color doppler waveform fluid around dome of liver.  All other quadrants appear within normal limits.   11/07/2015  CLINICAL DATA:  Hepatic encephalopathy.  Jaundiced. EXAM: US ABDOMEN LIMITED - RIGHT UPPER QUADRANT COMPARISON:  CT scan 11/06/2015 FINDINGS: Gallbladder: Moderate distention of the gallbladder and numerous small gallstones. No wall thickening or pericholecystic fluid. Negative sonographic Murphy sign. Common bile duct: Diameter: 3.2 mm Liver: Severe changes of cirrhosis and fatty infiltration. No focal hepatic lesions are identified. Reversal of flow in the main portal vein suggesting severe portal hypertension. No obvious ascites. IMPRESSION: 1. Distended gallbladder with numerous small gallstones. No wall thickening, pericholecystic fluid or sonographic Murphy sign to suggest acute cholecystitis. 2. Normal caliber common bile duct. 3. Severe cirrhotic changes and fatty infiltration of the liver. 4. Reversal of flow in the main portal vein consistent with severe portal hypertension. Electronically Signed   By: Marijo Sanes M.D.   On: 11/07/2015 10:32    Review of Systems  Constitutional: Negative for fever, chills, weight loss, malaise/fatigue and diaphoresis.  HENT: Negative.   Eyes: Negative for blurred vision, double vision, photophobia, pain, discharge and redness.  Respiratory: Negative.  Negative for cough.   Cardiovascular: Negative.   Gastrointestinal: Positive for abdominal pain and melena.  Genitourinary: Negative.   Musculoskeletal: Negative.   Skin: Negative.   Neurological: Negative.  Negative for weakness.  Endo/Heme/Allergies: Negative for environmental allergies and polydipsia. Does not bruise/bleed easily.  Psychiatric/Behavioral: Negative.    Blood pressure 98/58, pulse 80, temperature 98.3 F (36.8 C), temperature source Oral, resp. rate 18, height _0  (1.575 m), weight 97.07 kg (214 lb), SpO2 100 %. Physical Exam  Nursing note and vitals reviewed. Constitutional: She is oriented to person, place, and time.  She appears well-developed. She appears distressed.  Morbidly obese. Appeared to be in pain.   HENT:  Head: Normocephalic and atraumatic.  Oral mucosa dry.  Eyes: Scleral icterus is present.  Neck: Normal range of motion. Neck supple. No JVD present. No tracheal deviation present. No thyromegaly present.  Cardiovascular: Normal rate, regular rhythm, normal heart sounds and intact distal pulses.  Exam reveals no gallop and no friction rub.   No murmur heard. Respiratory: Effort normal and breath sounds normal. No stridor.  GI: Soft. Bowel sounds are normal. She exhibits no mass. There is tenderness. There is no rebound and no guarding.  Right upper quadrant and epigastric area TTP. No ascites.   Musculoskeletal: She exhibits no edema or tenderness.  Lymphadenopathy:    She has no cervical adenopathy.  Neurological: She is alert and oriented to person, place, and time.  Skin: Skin is warm and dry. She is not diaphoretic.  Jaundice.   Psychiatric: She has a normal mood and affect.    Assessment/Plan: 1. Hepatic Encephalopathy: agree with Lactulose 30 grams every 6 hours. Ammonia level now 59. Alert,oriented and appropriate at my visit.  2. GI Bleed/Anemia: Hbg 7.1 gm/dl from 8.8 gm/dl on presentation to ER. Melanotic stools. Receiving PRBC  infusion. H/H repeat has been ordered. EGD on tomorrow if Urine Drug Screen is negative for Cocaine.  3. Electrolyte Imbalance. K+ 2.9 today despite receiving 10 meq IVPB twice since admission. Repeat K+ level pending. EGD on tomorrow if K+ has normalized.  4. Hepatorenal syndrome: Nephrology has been consulted Hepatitis panel results pending. Tylenol level within normal range. Alcohol level 5. Jaundice on exam. Ammonia level coming down with Lactulose. Repeat CMP has been scheduled for tomorrow morning.   Thank you so much for this consultation and for allowing [place] to participate in the patient's plan of care.   Patient seen in collaboration with Dr. Verdie Shire.    Abigail Eakins Hassell Done, FNP-BC 11/07/2015, 4:06 PM

## 2015-11-07 NOTE — Progress Notes (Signed)
[place] at [place] NAME: Abigail Cook    MR#:  [id number]  DATE OF BIRTH:  1966/09/20  SUBJECTIVE:  CHIEF COMPLAINT:   Chief Complaint  Patient presents with  . Abdominal Pain  . Chest Pain  . Leg Pain  . The patient is 49 year old African-American female with medical history significant for History of alcohol abuse, essential hypertension, and seizures who presents to the hospital with complaints of right upper quadrant abdominal pain and body aches. In emergency room, she was noted to be severely anemic, hypotensive, jaundiced and in acute renal failure. Her CT scan was concerning for liver cirrhosis. Patient has Foley catheter placed , given IV fluids and her kidney function somewhat improved. She is producing some urine. The patient complains of body aches, abdominal pain, back pain. Admits of black stool a week ago. Wants to eat and drink  Review of Systems  Constitutional: Positive for malaise/fatigue. Negative for fever, chills and weight loss.  HENT: Negative for congestion.   Eyes: Negative for blurred vision and double vision.  Respiratory: Negative for cough, sputum production, shortness of breath and wheezing.   Cardiovascular: Negative for chest pain, palpitations, orthopnea, leg swelling and PND.  Gastrointestinal: Positive for melena. Negative for nausea, vomiting, abdominal pain, diarrhea, constipation and blood in stool.  Genitourinary: Negative for dysuria, urgency, frequency and hematuria.  Musculoskeletal: Positive for myalgias and back pain. Negative for falls.  Neurological: Negative for dizziness, tremors, focal weakness and headaches.  Endo/Heme/Allergies: Does not bruise/bleed easily.  Psychiatric/Behavioral: Negative for depression. The patient does not have insomnia.     VITAL SIGNS: Blood pressure 100/48, pulse 79, temperature 98.1 F (36.7 C), temperature source Oral, resp. rate 20, height 5\' 2"  (1.575  m), weight 97.07 kg (214 lb), SpO2 100 %.  PHYSICAL EXAMINATION:   GENERAL:  49 y.o.-year-old patient lying in the bed in mild to moderate distress. Dry oral mucosa EYES: Pupils equal, round, reactive to light and accommodation. No scleral icterus. Extraocular muscles intact.  HEENT: Head atraumatic, normocephalic. Oropharynx and nasopharynx clear.  NECK:  Supple, no jugular venous distention. No thyroid enlargement, no tenderness.  LUNGS: Some diminished breath sounds bilaterally, no wheezing, rales,rhonchi or crepitation. No use of accessory muscles of respiration.  CARDIOVASCULAR: S1, S2 normal. No murmurs, rubs, or gallops.  ABDOMEN: Soft, tender diffusely, mostly in the right upper quadrant and epigastric area, nondistended. Bowel sounds present. No organomegaly or mass.  EXTREMITIES: No pedal edema, cyanosis, or clubbing. Some CVA tenderness on the left on percussion NEUROLOGIC: Cranial nerves II through XII are intact. Muscle strength 5/5 in all extremities. Sensation intact. Gait not checked.  PSYCHIATRIC: The patient is alert and oriented x 3.  SKIN: No obvious rash, lesion, or ulcer.   ORDERS/RESULTS REVIEWED:   CBC  Recent Labs Lab 11/06/15 1258 11/07/15 0450 11/07/15 1116  WBC 13.1* 11.0  --   HGB 8.8* 7.1* 7.1*  HCT 27.3* 21.6* 22.6*  PLT 193 156  --   MCV 97.3 97.6  --   MCH 31.3 32.2  --   MCHC 32.2 33.0  --   RDW 28.1* 27.8*  --    ------------------------------------------------------------------------------------------------------------------  Chemistries   Recent Labs Lab 11/06/15 1258 11/06/15 2207 11/07/15 0450  NA 130* 130* 132*  K 3.1* 2.8* 2.9*  CL 92* 96* 101  CO2 14* 20* 19*  GLUCOSE 117* 109* 88  BUN 39* 39* 38*  CREATININE UNABLE TO REPORT DUE TO ICTERIC INTERFERENCE UNABLE  TO REPORT DUE TO ICTERUS  5.95*  CALCIUM 8.0* 7.3* 7.1*  MG  --  1.3* 1.4*  AST 130*  --  101*  ALT 34  --  29  ALKPHOS 71  --  55  BILITOT 27.1*  --  23.2*    ------------------------------------------------------------------------------------------------------------------ estimated creatinine clearance is 12.3 mL/min (by C-G formula based on Cr of 5.95). ------------------------------------------------------------------------------------------------------------------  Recent Labs  11/07/15 0450  TSH 0.449    Cardiac Enzymes  Recent Labs Lab 11/06/15 1258  TROPONINI 0.04*   ------------------------------------------------------------------------------------------------------------------ Invalid input(s): POCBNP ---------------------------------------------------------------------------------------------------------------  RADIOLOGY: Ct Abdomen Pelvis Wo Contrast  11/06/2015  CLINICAL DATA:  Acute onset of generalized abdominal pain and right leg pain. Nausea and vomiting. Jaundice. Initial encounter. EXAM: CT ABDOMEN AND PELVIS WITHOUT CONTRAST TECHNIQUE: Multidetector CT imaging of the abdomen and pelvis was performed following the standard protocol without IV contrast. COMPARISON:  Abdominal ultrasound performed 07/25/2014 FINDINGS: Minimal bibasilar opacities demonstrate associated bronchiectasis and may reflect mild scarring or possibly chronic sequelae of infection. Scattered coronary artery calcifications are seen. There is a diffusely nodular contour to the liver, with diffusely decreased attenuation, compatible with hepatic cirrhosis and likely regenerative and degenerative nodules. No definite dominant mass is characterized. The spleen is unremarkable in appearance. Stones are seen dependently within the gallbladder. The gallbladder is mildly distended but otherwise unremarkable. The pancreas and adrenal glands are within normal limits. The kidneys are unremarkable in appearance. There is no evidence of hydronephrosis. No renal or ureteral stones are seen. Mild nonspecific perinephric stranding is noted bilaterally. Mild stranding  tracks along both paracolic gutters. No free fluid is identified. The small bowel is unremarkable in appearance. The stomach is within normal limits. No acute vascular abnormalities are seen. Mild scattered calcification is noted along the abdominal aorta and its branches. Mild retroperitoneal stranding is nonspecific. The appendix is grossly unremarkable in appearance. The colon is grossly unremarkable. Mild stranding about the distal sigmoid colon is nonspecific. The bladder is mildly distended and grossly unremarkable. The uterus is within normal limits. The ovaries are grossly symmetric. No suspicious adnexal masses are seen. No inguinal lymphadenopathy is seen. No acute osseous abnormalities are identified. IMPRESSION: 1. No definite acute abnormality seen to explain the patient's symptoms. 2. Minimal bibasilar airspace opacities demonstrate associated bronchiectasis and may reflect mild scarring or possibly chronic sequelae of infection. 3. Mild soft tissue stranding about the distal sigmoid colon, along the retroperitoneum and along the paracolic gutters is nonspecific but may reflect the patient's cirrhosis. 4. Findings of hepatic cirrhosis, with likely regenerative and degenerative nodules. No definite dominant mass seen. 5. Scattered coronary artery calcifications seen. 6. Mild calcification along the abdominal aorta and its branches. Electronically Signed   By: Garald Balding M.D.   On: 11/06/2015 18:39   Dg Chest 2 View  11/06/2015  CLINICAL DATA:  Chest pain, dyspnea for 3 days. EXAM: CHEST  2 VIEW COMPARISON:  July 22, 2014. FINDINGS: The heart size and mediastinal contours are within normal limits. Both lungs are clear. No pneumothorax or pleural effusion is noted. The visualized skeletal structures are unremarkable. IMPRESSION: No active cardiopulmonary disease. Electronically Signed   By: Marijo Conception, M.D.   On: 11/06/2015 13:50   US Abdomen Limited Ruq  11/07/2015  CLINICAL DATA:   Hepatic encephalopathy.  Jaundiced. EXAM: US ABDOMEN LIMITED - RIGHT UPPER QUADRANT COMPARISON:  CT scan 11/06/2015 FINDINGS: Gallbladder: Moderate distention of the gallbladder and numerous small gallstones. No wall thickening or pericholecystic fluid. Negative sonographic Percell Miller  sign. Common bile duct: Diameter: 3.2 mm Liver: Severe changes of cirrhosis and fatty infiltration. No focal hepatic lesions are identified. Reversal of flow in the main portal vein suggesting severe portal hypertension. No obvious ascites. IMPRESSION: 1. Distended gallbladder with numerous small gallstones. No wall thickening, pericholecystic fluid or sonographic Murphy sign to suggest acute cholecystitis. 2. Normal caliber common bile duct. 3. Severe cirrhotic changes and fatty infiltration of the liver. 4. Reversal of flow in the main portal vein consistent with severe portal hypertension. Electronically Signed   By: Marijo Sanes M.D.   On: 11/07/2015 10:32    EKG:  Orders placed or performed during the hospital encounter of 11/06/15  . EKG 12-Lead  . EKG 12-Lead  . ED EKG within 10 minutes  . ED EKG within 10 minutes    ASSESSMENT AND PLAN:  Active Problems:   Hepatic encephalopathy ([place]) 1. Acute renal failure, continue patient on IV fluids, transfuse packed red blood cells. Following patient's blood pressure readings closely, Continue Foley catheter, following urinary output, Follow creatinine in the morning, appreciate nephrologist input, CT of abdomen and pelvis showed no hydronephrosis 2. Right upper quadrant abdominal pain, no cholecystitis, likely due to alcoholic hepatitis versus alcoholic gastritis, initiate patient on Protonix twice daily intravenously and add steroids for suspected alcoholic hepatitis ,  3. Hepatic encephalopathy, continue lactulose 4. Gastrointestinal  bleed, continue Protonix twice daily intravenously. Gastroenterology consultation was requested,  5. Acute posthemorrhagic anemia ,  transfused. Patient with 2 units of packed blood cells following hemoglobin level in the morning  6. Hyponatremia , follow with rehydration  7.hypokalemia supplementing intravenously and orally , follow potassium level later today  8. Coagulopathy , likely due to liver disease , given vitamin K, follow pro time in the morning  9. 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   DRUG ALLERGIES: No Known Allergies  CODE STATUS:     Code Status Orders        Start     Ordered   11/07/15 0025  Full code   Continuous     11/07/15 0024      TOTAL  CRITICAL CARETIME TAKING CARE OF THIS PATIENT: 60utes.   discussed with gastroenterologist and nephrologist   Iniko Robles M.D on 11/07/2015 at 5:11 PM  Between 7am to 6pm - Pager - [phone]  After 6pm go to [email or web address] - password [email or web address] [facility]  [place] [place] [facility]  Office  [phone]  CC: Primary care physician; No PCP Per Patient

## 2015-11-08 ENCOUNTER — Encounter: Payer: Self-pay | Admitting: Gastroenterology

## 2015-11-08 LAB — CBC
HEMATOCRIT: 27.7 % — AB (ref 35.0–47.0)
HEMOGLOBIN: 9.3 g/dL — AB (ref 12.0–16.0)
MCH: 31 pg (ref 26.0–34.0)
MCHC: 33.4 g/dL (ref 32.0–36.0)
MCV: 92.9 fL (ref 80.0–100.0)
Platelets: 146 10*3/uL — ABNORMAL LOW (ref 150–440)
RBC: 2.99 MIL/uL — ABNORMAL LOW (ref 3.80–5.20)
RDW: 27.5 % — ABNORMAL HIGH (ref 11.5–14.5)
WBC: 10.1 10*3/uL (ref 3.6–11.0)

## 2015-11-08 LAB — COMPREHENSIVE METABOLIC PANEL
ALBUMIN: 1.5 g/dL — AB (ref 3.5–5.0)
ALK PHOS: 51 U/L (ref 38–126)
ALT: 30 U/L (ref 14–54)
ANION GAP: 9 (ref 5–15)
AST: 94 U/L — AB (ref 15–41)
BUN: 39 mg/dL — AB (ref 6–20)
CALCIUM: 7.7 mg/dL — AB (ref 8.9–10.3)
CO2: 19 mmol/L — AB (ref 22–32)
Chloride: 109 mmol/L (ref 101–111)
Creatinine, Ser: UNDETERMINED mg/dL (ref 0.44–1.00)
GLUCOSE: 152 mg/dL — AB (ref 65–99)
POTASSIUM: 3 mmol/L — AB (ref 3.5–5.1)
SODIUM: 137 mmol/L (ref 135–145)
Total Bilirubin: 25.5 mg/dL (ref 0.3–1.2)
Total Protein: 7.2 g/dL (ref 6.5–8.1)

## 2015-11-08 LAB — URINE CULTURE

## 2015-11-08 LAB — URINE DRUG SCREEN, QUALITATIVE (ARMC ONLY)
AMPHETAMINES, UR SCREEN: NOT DETECTED
Barbiturates, Ur Screen: NOT DETECTED
Benzodiazepine, Ur Scrn: NOT DETECTED
COCAINE METABOLITE, UR ~~LOC~~: POSITIVE — AB
Cannabinoid 50 Ng, Ur ~~LOC~~: NOT DETECTED
MDMA (ECSTASY) UR SCREEN: NOT DETECTED
METHADONE SCREEN, URINE: NOT DETECTED
OPIATE, UR SCREEN: POSITIVE — AB
PHENCYCLIDINE (PCP) UR S: NOT DETECTED
Tricyclic, Ur Screen: NOT DETECTED

## 2015-11-08 LAB — HEPATITIS A ANTIBODY, IGM: Hep A IgM: NEGATIVE

## 2015-11-08 LAB — PROTIME-INR
INR: 1.88
Prothrombin Time: 21.5 seconds — ABNORMAL HIGH (ref 11.4–15.0)

## 2015-11-08 LAB — HEPATITIS B SURFACE ANTIGEN: HEP B S AG: NEGATIVE

## 2015-11-08 LAB — HEMOGLOBIN AND HEMATOCRIT, BLOOD
HCT: 27.4 % — ABNORMAL LOW (ref 35.0–47.0)
Hemoglobin: 9.1 g/dL — ABNORMAL LOW (ref 12.0–16.0)

## 2015-11-08 LAB — HEPATITIS C ANTIBODY

## 2015-11-08 MED ORDER — DEXTROSE 5 % IV SOLN
1.0000 g | INTRAVENOUS | Status: DC
  Administered 2015-11-08 – 2015-11-11 (×4): 1 g via INTRAVENOUS
  Filled 2015-11-08 (×5): qty 10

## 2015-11-08 MED ORDER — POTASSIUM CHLORIDE CRYS ER 20 MEQ PO TBCR
40.0000 meq | EXTENDED_RELEASE_TABLET | Freq: Two times a day (BID) | ORAL | Status: DC
  Administered 2015-11-08 – 2015-11-11 (×8): 40 meq via ORAL
  Filled 2015-11-08 (×8): qty 2

## 2015-11-08 MED ORDER — THIAMINE HCL 100 MG/ML IJ SOLN
INTRAVENOUS | Status: DC
  Administered 2015-11-08 – 2015-11-09 (×3): via INTRAVENOUS
  Filled 2015-11-08 (×7): qty 1000

## 2015-11-08 NOTE — Progress Notes (Signed)
[place] at [place] NAME: Abigail Cook    MR#:  [id number]  DATE OF BIRTH:  18-Nov-1966  SUBJECTIVE:  CHIEF COMPLAINT:   Chief Complaint  Patient presents with  . Abdominal Pain  . Chest Pain  . Leg Pain  . The patient is 49 year old African-American female with medical history significant for History of alcohol abuse, essential hypertension, and seizures who presents to the hospital with complaints of right upper quadrant abdominal pain and body aches. In emergency room, she was noted to be severely anemic, hypotensive, jaundiced and in acute renal failure. Her CT scan was concerning for liver cirrhosis. Patient has Foley catheter placed , given IV fluids and her output has improved / creatinine could not have been calculated due to interference. With bilirubin. Feels comfortable today. GI recommended for liquid diet to be continued up until Monday and possible EGD on Monday. If cocaine is out from system. Abdominal pain has subsided, blood cultures are negative. Urine culture revealed insignificant growth, now on Rocephin. Urine output is good at 1400. Over the past 24 hours. Patient was transfused packed red blood cells yesterday with improvement of hemoglobin, no bleeding was noted    . Review of Systems  Constitutional: Positive for malaise/fatigue. Negative for fever, chills and weight loss.  HENT: Negative for congestion.   Eyes: Negative for blurred vision and double vision.  Respiratory: Negative for cough, sputum production, shortness of breath and wheezing.   Cardiovascular: Negative for chest pain, palpitations, orthopnea, leg swelling and PND.  Gastrointestinal: Positive for melena. Negative for nausea, vomiting, abdominal pain, diarrhea, constipation and blood in stool.  Genitourinary: Negative for dysuria, urgency, frequency and hematuria.  Musculoskeletal: Positive for myalgias and back pain. Negative for falls.   Neurological: Negative for dizziness, tremors, focal weakness and headaches.  Endo/Heme/Allergies: Does not bruise/bleed easily.  Psychiatric/Behavioral: Negative for depression. The patient does not have insomnia.     VITAL SIGNS: Blood pressure 101/57, pulse 75, temperature 98.3 F (36.8 C), temperature source Oral, resp. rate 20, height 5\' 2"  (1.575 m), weight 97.07 kg (214 lb), SpO2 98 %.  PHYSICAL EXAMINATION:   GENERAL:  49 y.o.-year-old patient lying in the bed in mild to moderate distress. Dry oral mucosa EYES: Pupils equal, round, reactive to light and accommodation. No scleral icterus. Extraocular muscles intact.  HEENT: Head atraumatic, normocephalic. Oropharynx and nasopharynx clear.  NECK:  Supple, no jugular venous distention. No thyroid enlargement, no tenderness.  LUNGS: Some diminished breath sounds bilaterally, no wheezing, rales,rhonchi or crepitation. No use of accessory muscles of respiration.  CARDIOVASCULAR: S1, S2 normal. No murmurs, rubs, or gallops.  ABDOMEN: Softminimal discomfort on palpation. No tenderness, nondistended. Bowel sounds present. No organomegaly or mass.  EXTREMITIES: No pedal edema, cyanosis, or clubbing. Some CVA tenderness on the left on percussion NEUROLOGIC: Cranial nerves II through XII are intact. Muscle strength 5/5 in all extremities. Sensation intact. Gait not checked.  PSYCHIATRIC: The patient is alert and oriented x 3.  SKIN: No obvious rash, lesion, or ulcer.   ORDERS/RESULTS REVIEWED:   CBC  Recent Labs Lab 11/06/15 1258 11/07/15 0450 11/07/15 1116 11/08/15 0040 11/08/15 0535  WBC 13.1* 11.0  --   --  10.1  HGB 8.8* 7.1* 7.1* 9.1* 9.3*  HCT 27.3* 21.6* 22.6* 27.4* 27.7*  PLT 193 156  --   --  146*  MCV 97.3 97.6  --   --  92.9  MCH 31.3 32.2  --   --  31.0  MCHC 32.2 33.0  --   --  33.4  RDW 28.1* 27.8*  --   --  27.5*    ------------------------------------------------------------------------------------------------------------------  Chemistries   Recent Labs Lab 11/06/15 1258 11/06/15 2207 11/07/15 0450 11/08/15 0040  NA 130* 130* 132* 137  K 3.1* 2.8* 2.9* 3.0*  CL 92* 96* 101 109  CO2 14* 20* 19* 19*  GLUCOSE 117* 109* 88 152*  BUN 39* 39* 38* 39*  CREATININE UNABLE TO REPORT DUE TO ICTERIC INTERFERENCE UNABLE TO REPORT DUE TO ICTERUS  5.95* UNABLE TO REPORT DUE TO ICTERUS  CALCIUM 8.0* 7.3* 7.1* 7.7*  MG  --  1.3* 1.4*  --   AST 130*  --  101* 94*  ALT 34  --  29 30  ALKPHOS 71  --  55 51  BILITOT 27.1*  --  23.2* 25.5*   ------------------------------------------------------------------------------------------------------------------ CrCl cannot be calculated (Patient has no serum creatinine result on file.). ------------------------------------------------------------------------------------------------------------------  Recent Labs  11/07/15 0450  TSH 0.449    Cardiac Enzymes  Recent Labs Lab 11/06/15 1258  TROPONINI 0.04*   ------------------------------------------------------------------------------------------------------------------ Invalid input(s): POCBNP ---------------------------------------------------------------------------------------------------------------  RADIOLOGY: Ct Abdomen Pelvis Wo Contrast  11/06/2015  CLINICAL DATA:  Acute onset of generalized abdominal pain and right leg pain. Nausea and vomiting. Jaundice. Initial encounter. EXAM: CT ABDOMEN AND PELVIS WITHOUT CONTRAST TECHNIQUE: Multidetector CT imaging of the abdomen and pelvis was performed following the standard protocol without IV contrast. COMPARISON:  Abdominal ultrasound performed 07/25/2014 FINDINGS: Minimal bibasilar opacities demonstrate associated bronchiectasis and may reflect mild scarring or possibly chronic sequelae of infection. Scattered coronary artery calcifications are seen.  There is a diffusely nodular contour to the liver, with diffusely decreased attenuation, compatible with hepatic cirrhosis and likely regenerative and degenerative nodules. No definite dominant mass is characterized. The spleen is unremarkable in appearance. Stones are seen dependently within the gallbladder. The gallbladder is mildly distended but otherwise unremarkable. The pancreas and adrenal glands are within normal limits. The kidneys are unremarkable in appearance. There is no evidence of hydronephrosis. No renal or ureteral stones are seen. Mild nonspecific perinephric stranding is noted bilaterally. Mild stranding tracks along both paracolic gutters. No free fluid is identified. The small bowel is unremarkable in appearance. The stomach is within normal limits. No acute vascular abnormalities are seen. Mild scattered calcification is noted along the abdominal aorta and its branches. Mild retroperitoneal stranding is nonspecific. The appendix is grossly unremarkable in appearance. The colon is grossly unremarkable. Mild stranding about the distal sigmoid colon is nonspecific. The bladder is mildly distended and grossly unremarkable. The uterus is within normal limits. The ovaries are grossly symmetric. No suspicious adnexal masses are seen. No inguinal lymphadenopathy is seen. No acute osseous abnormalities are identified. IMPRESSION: 1. No definite acute abnormality seen to explain the patient's symptoms. 2. Minimal bibasilar airspace opacities demonstrate associated bronchiectasis and may reflect mild scarring or possibly chronic sequelae of infection. 3. Mild soft tissue stranding about the distal sigmoid colon, along the retroperitoneum and along the paracolic gutters is nonspecific but may reflect the patient's cirrhosis. 4. Findings of hepatic cirrhosis, with likely regenerative and degenerative nodules. No definite dominant mass seen. 5. Scattered coronary artery calcifications seen. 6. Mild  calcification along the abdominal aorta and its branches. Electronically Signed   By: Garald Balding M.D.   On: 11/06/2015 18:39   US Abdomen Limited Ruq  11/07/2015  CLINICAL DATA:  Hepatic encephalopathy.  Jaundiced. EXAM: US ABDOMEN LIMITED - RIGHT UPPER QUADRANT COMPARISON:  CT scan 11/06/2015 FINDINGS: Gallbladder: Moderate distention of the gallbladder and numerous small gallstones. No wall thickening or pericholecystic fluid. Negative sonographic Murphy sign. Common bile duct: Diameter: 3.2 mm Liver: Severe changes of cirrhosis and fatty infiltration. No focal hepatic lesions are identified. Reversal of flow in the main portal vein suggesting severe portal hypertension. No obvious ascites. IMPRESSION: 1. Distended gallbladder with numerous small gallstones. No wall thickening, pericholecystic fluid or sonographic Murphy sign to suggest acute cholecystitis. 2. Normal caliber common bile duct. 3. Severe cirrhotic changes and fatty infiltration of the liver. 4. Reversal of flow in the main portal vein consistent with severe portal hypertension. Electronically Signed   By: Marijo Sanes M.D.   On: 11/07/2015 10:32    EKG:  Orders placed or performed during the hospital encounter of 11/06/15  . EKG 12-Lead  . EKG 12-Lead  . ED EKG within 10 minutes  . ED EKG within 10 minutes    ASSESSMENT AND PLAN:  Active Problems:   Hepatic encephalopathy ([place]) 1. Acute renal failure, continue patient on IV fluids, . Also, patient was transfused packed red blood cells. Urinary output has improved, although creatinine could not have been calculated due to interference with bilirubin, appreciate nephrologist input, CT of abdomen and pelvis showed no hydronephrosis 2. Right upper quadrant abdominal pain, no cholecystitis, likely due to alcoholic hepatitis versus alcoholic gastritis, continue Protonix twice daily intravenously and steroids for alcoholic hepatitis , improved clinically , gastroenterologist is  planning EGD on Monday   3. Hepatic encephalopathy, continue lactulose 4. Gastrointestinal  bleed, continue Protonix twice daily intravenously. Gastroenterology consultatiois obtained. EGD on Monday as planned  5. Acute posthemorrhagic anemia , transfused 2 units of packed blood cells, improved hemoglobin level, follow  in the morning  6. Hyponatremi, Resolved with  rehydration  7.hypokalemia supplementing intravenously and orally , follow potassium  level tomorrow morning 8. Coagulopathy , due to liver disease , given vitamin K, pro time  remains stable   9. 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 She is agreeable     Management plans discussed with the patient, family and they are in agreement.   DRUG ALLERGIES: No Known Allergies  CODE STATUS:     Code Status Orders        Start     Ordered   11/07/15 0025  Full code   Continuous     11/07/15 0024      TOTAL  CRITICAL CARETIME TAKING CARE OF THIS PATIENt 45 minutes. Discussed with nephrologist  Kayton Dunaj M.D on 11/08/2015 at 3:44 PM  Between 7am to 6pm - Pager - [phone]  After 6pm go to [email or web address] - password [email or web address]  [place] [facility]  Office  [phone]  CC: Primary care physician; No PCP Per Patient

## 2015-11-08 NOTE — Consult Note (Signed)
  GI Inpatient Follow-up Note  Patient Identification: Abigail Cook is a 49 y.o. female with liver cirrhosis and probable alcoholic hepatitis.  Subjective: LFT remains high. K upto 3.0 this AM, but cocaine still in her system, possibly related to her renal failure. Hungry.  Scheduled Inpatient Medications:  . lactulose  30 g Oral Q6H  . methylPREDNISolone (SOLU-MEDROL) injection  60 mg Intravenous Q24H  . pantoprazole (PROTONIX) IV  40 mg Intravenous Q12H  . piperacillin-tazobactam (ZOSYN)  IV  3.375 g Intravenous Q12H  . potassium chloride  40 mEq Oral BID  . sodium chloride  3 mL Intravenous Q12H    Continuous Inpatient Infusions:     PRN Inpatient Medications:  morphine injection, ondansetron **OR** ondansetron (ZOFRAN) IV  Review of Systems: Constitutional: Weight is stable.  Eyes: No changes in vision. ENT: No oral lesions, sore throat.  GI: see HPI.  Heme/Lymph: No easy bruising.  CV: No chest pain.  GU: No hematuria.  Integumentary: No rashes.  Neuro: No headaches.  Psych: No depression/anxiety.  Endocrine: No heat/cold intolerance.  Allergic/Immunologic: No urticaria.  Resp: No cough, SOB.  Musculoskeletal: No joint swelling.    Physical Examination: BP 113/58 mmHg  Pulse 82  Temp(Src) 98.2 F (36.8 C) (Oral)  Resp 20  Ht 5\' 2"  (1.575 m)  Wt 97.07 kg (214 lb)  BMI 39.13 kg/m2  SpO2 97% Gen: NAD, alert and oriented x 4 HEENT: PEERLA, EOMI, Neck: supple, no JVD or thyromegaly Chest: CTA bilaterally, no wheezes, crackles, or other adventitious sounds CV: RRR, no m/g/c/r Abd: soft, upper abdominal tenderness, ND, +BS in all four quadrants; no HSM, guarding, ridigity, or rebound tenderness Ext: no edema, well perfused with 2+ pulses, Skin: no rash or lesions noted Lymph: no LAD  Data: Lab Results  Component Value Date   WBC 10.1 11/08/2015   HGB 9.3* 11/08/2015   HCT 27.7* 11/08/2015   MCV 92.9 11/08/2015   PLT 146* 11/08/2015    Recent  Labs Lab 11/07/15 1116 11/08/15 0040 11/08/15 0535  HGB 7.1* 9.1* 9.3*   Lab Results  Component Value Date   NA 137 11/08/2015   K 3.0* 11/08/2015   CL 109 11/08/2015   CO2 19* 11/08/2015   BUN 39* 11/08/2015   CREATININE UNABLE TO REPORT DUE TO ICTERUS 11/08/2015   Lab Results  Component Value Date   ALT 30 11/08/2015   AST 94* 11/08/2015   ALKPHOS 51 11/08/2015   BILITOT 25.5* 11/08/2015    Recent Labs Lab 11/07/15 0921 11/08/15 0535  APTT 44*  --   INR 1.96 1.88   Assessment/Plan: Abigail Cook is a 49 y.o. female with liver and renal failure.   Recommendations: Continue IV hydration. Continue to moniter LFT. Hold off on EGD. Full liquid diet ordered. Possible EGD on Monday if cocaine out of her system. Dr. Vira Agar to check on pt over thew weekend. thanks Please call with questions or concerns.  Lurline Caver, Lupita Dawn, MD

## 2015-11-08 NOTE — Care Management (Addendum)
Patient does not engage in conversation with CM. Does not hold eye contact. Patient says that the PCP on her medicaid [place] Access card is deceased. The practice appears to be [facility] on [address]. Discussed with that of the need for her to call medicaid to request change in PCP. Verbalized understanding. She denies issues obtaining medication or transportation. Unable to confirm if these are reliable answers. Patient does not appear to present frequently. She is also followed by [facility].        Patient says she is going to be seen at [facility]

## 2015-11-08 NOTE — Progress Notes (Signed)
Critical lab bilirubin 25.5 and K 3.0 result called to MD on call, no new order received.

## 2015-11-08 NOTE — Progress Notes (Signed)
Initial Nutrition Assessment     INTERVENTION:   Meals and Snacks: Cater to patient preferences Medical Food Supplement Therapy: recommend addition of Carnation Instant Breakfast BID between meals for added nutrition  NUTRITION DIAGNOSIS:   Inadequate oral intake related to acute illness as evidenced by per patient/family report.  GOAL:   Patient will meet greater than or equal to 90% of their needs  MONITOR:    (Energy Intake, Anthropometrics, Digestive System, Electrolyte/Renal Profile)  REASON FOR ASSESSMENT:   Diagnosis    ASSESSMENT:    Pt admitted with ARF with hyponatremia, hypokalemia; hepatic encephalopathy with liver cirrhosis, GI bleed,  +coccaine on admission  Past Medical History  Diagnosis Date  . Hypertension   . Seizures ([place])     Diet Order:  Diet full liquid Room service appropriate?: Yes; Fluid consistency:: Thin   Energy Intake: appetite good today, ate 100% of meal tray  Food and Nutrition Related History: pt reports poor intake for several days prior to admission, diet just advanced to [facility] today, has been NPO since admission.   Skin:   (jaundice, no wounds)  Last BM:  11/18   Electrolyte and Renal Profile:  Recent Labs Lab 11/06/15 2207 11/07/15 0450 11/08/15 0040  BUN 39* 38* 39*  CREATININE UNABLE TO REPORT DUE TO ICTERUS  5.95* UNABLE TO REPORT DUE TO ICTERUS  NA 130* 132* 137  K 2.8* 2.9* 3.0*  MG 1.3* 1.4*  --    Glucose Profile: No results for input(s): GLUCAP in the last 72 hours. Protein Profile:   Recent Labs Lab 11/06/15 1258 11/07/15 0450 11/08/15 0040  ALBUMIN 1.9* 1.6* 1.5*   Meds: D5-NS at 75 ml/hr, solumedrol, lactulose  Nutrition Focused Physical Exam:  Unable to complete Nutrition-Focused physical exam at this time.    Height:   Ht Readings from Last 1 Encounters:  11/06/15 5\' 2"  (1.575 m)    Weight: pt reports normal wt around 232 pounds but per wt encounters, wt has been relatively stable. Pt  unsure when she last weighed this amount  Wt Readings from Last 1 Encounters:  11/06/15 214 lb (97.07 kg)    Wt Readings from Last 10 Encounters:  11/06/15 214 lb (97.07 kg)  08/14/15 216 lb (97.977 kg)    BMI:  Body mass index is 39.13 kg/(m^2).  Estimated Nutritional Needs:   Kcal:  1500-1750 kcals   Protein:  50-60 g (1.0-1.2 gkg)   Fluid:  1250-1500 mL   MODERATE Care Level  Kerman Passey MS, RD, LDN [phone] Pager

## 2015-11-08 NOTE — Progress Notes (Signed)
[facility]  ROUNDING NOTE   Subjective:  Patient doing better today. Hemoglobin significantly improved Creatinine could not be calculated secondary to hyperbilirubinemia    Objective:  Vital signs in last 24 hours:  Temp:  [98 F (36.7 C)-98.3 F (36.8 C)] 98.3 F (36.8 C) (11/18 1130) Pulse Rate:  [74-82] 75 (11/18 1130) Resp:  [18-21] 20 (11/18 1130) BP: (94-118)/(44-64) 101/57 mmHg (11/18 1130) SpO2:  [94 %-100 %] 98 % (11/18 1130)  Weight change:  Filed Weights   11/06/15 1250  Weight: 97.07 kg (214 lb)    Intake/Output: I/O last 3 completed shifts: In: 29 [Blood:590] Out: [id number] [Urine:1400; Stool:2]   Intake/Output this shift:  Total I/O In: 480 [P.O.:480] Out: -   Physical Exam: General: NAD  Head: Normocephalic, atraumatic. Moist oral mucosal membranes  Eyes: Icterus noted  Neck: Supple, trachea midline  Lungs:  Clear to auscultation normal effort  Heart: Regular rate and rhythm  Abdomen:  Soft, nontender, BS present  Extremities:  no peripheral edema.  Neurologic: Nonfocal, moving all four extremities  Skin: No lesions       Basic Metabolic Panel:  Recent Labs Lab 11/06/15 1258 11/06/15 2207 11/07/15 0450 11/08/15 0040  NA 130* 130* 132* 137  K 3.1* 2.8* 2.9* 3.0*  CL 92* 96* 101 109  CO2 14* 20* 19* 19*  GLUCOSE 117* 109* 88 152*  BUN 39* 39* 38* 39*  CREATININE UNABLE TO REPORT DUE TO ICTERIC INTERFERENCE UNABLE TO REPORT DUE TO ICTERUS  5.95* UNABLE TO REPORT DUE TO ICTERUS  CALCIUM 8.0* 7.3* 7.1* 7.7*  MG  --  1.3* 1.4*  --     Liver Function Tests:  Recent Labs Lab 11/06/15 1258 11/07/15 0450 11/08/15 0040  AST 130* 101* 94*  ALT 34 29 30  ALKPHOS 71 55 51  BILITOT 27.1* 23.2* 25.5*  PROT 9.0* 7.1 7.2  ALBUMIN 1.9* 1.6* 1.5*    Recent Labs Lab 11/06/15 1258  LIPASE 25    Recent Labs Lab 11/06/15 1535 11/07/15 0117 11/07/15 0450  AMMONIA 126* UNABLE TO CALCULATE. 59*    CBC:  Recent  Labs Lab 11/06/15 1258 11/07/15 0450 11/07/15 1116 11/08/15 0040 11/08/15 0535  WBC 13.1* 11.0  --   --  10.1  HGB 8.8* 7.1* 7.1* 9.1* 9.3*  HCT 27.3* 21.6* 22.6* 27.4* 27.7*  MCV 97.3 97.6  --   --  92.9  PLT 193 156  --   --  146*    Cardiac Enzymes:  Recent Labs Lab 11/06/15 1258  TROPONINI 0.04*    BNP: Invalid input(s): POCBNP  CBG: No results for input(s): GLUCAP in the last 168 hours.  Microbiology: Results for orders placed or performed during the hospital encounter of 11/06/15  Culture, blood (routine x 2)     Status: None (Preliminary result)   Collection Time: 11/06/15  9:08 PM  Result Value Ref Range Status   Specimen Description BLOOD RIGHT HAND  Final   Special Requests BOTTLES DRAWN AEROBIC AND ANAEROBIC 5ML  Final   Culture NO GROWTH 2 DAYS  Final   Report Status PENDING  Incomplete  Culture, blood (routine x 2)     Status: None (Preliminary result)   Collection Time: 11/06/15  9:09 PM  Result Value Ref Range Status   Specimen Description BLOOD RIGHT ANTECUBITAL  Final   Special Requests   Final    BOTTLES DRAWN AEROBIC AND ANAEROBIC 10MLANAEROBIC, 14MLAEROBIC   Culture NO GROWTH 2 DAYS  Final  Report Status PENDING  Incomplete  Urine culture     Status: None   Collection Time: 11/07/15  1:30 AM  Result Value Ref Range Status   Specimen Description URINE, CATHETERIZED  Final   Special Requests NONE  Final   Culture INSIGNIFICANT GROWTH  Final   Report Status 11/08/2015 FINAL  Final    Coagulation Studies:  Recent Labs  11/06/15 1954 11/07/15 0921 11/08/15 0535  LABPROT 22.5* 22.2* 21.5*  INR 1.99 1.96 1.88    Urinalysis:  Recent Labs  11/06/15 1911  COLORURINE BROWN*  LABSPEC 1.019  PHURINE 0.0*  GLUCOSEU TEST NOT REPORTED DUE TO COLOR INTERFERENCE OF URINE PIGMENT*  HGBUR TEST NOT REPORTED DUE TO COLOR INTERFERENCE OF URINE PIGMENT*  BILIRUBINUR TEST NOT REPORTED DUE TO COLOR INTERFERENCE OF URINE PIGMENT*  KETONESUR TEST  NOT REPORTED DUE TO COLOR INTERFERENCE OF URINE PIGMENT*  PROTEINUR TEST NOT REPORTED DUE TO COLOR INTERFERENCE OF URINE PIGMENT*  NITRITE TEST NOT REPORTED DUE TO COLOR INTERFERENCE OF URINE PIGMENT*  LEUKOCYTESUR TEST NOT REPORTED DUE TO COLOR INTERFERENCE OF URINE PIGMENT*      Imaging: Ct Abdomen Pelvis Wo Contrast  11/06/2015  CLINICAL DATA:  Acute onset of generalized abdominal pain and right leg pain. Nausea and vomiting. Jaundice. Initial encounter. EXAM: CT ABDOMEN AND PELVIS WITHOUT CONTRAST TECHNIQUE: Multidetector CT imaging of the abdomen and pelvis was performed following the standard protocol without IV contrast. COMPARISON:  Abdominal ultrasound performed 07/25/2014 FINDINGS: Minimal bibasilar opacities demonstrate associated bronchiectasis and may reflect mild scarring or possibly chronic sequelae of infection. Scattered coronary artery calcifications are seen. There is a diffusely nodular contour to the liver, with diffusely decreased attenuation, compatible with hepatic cirrhosis and likely regenerative and degenerative nodules. No definite dominant mass is characterized. The spleen is unremarkable in appearance. Stones are seen dependently within the gallbladder. The gallbladder is mildly distended but otherwise unremarkable. The pancreas and adrenal glands are within normal limits. The kidneys are unremarkable in appearance. There is no evidence of hydronephrosis. No renal or ureteral stones are seen. Mild nonspecific perinephric stranding is noted bilaterally. Mild stranding tracks along both paracolic gutters. No free fluid is identified. The small bowel is unremarkable in appearance. The stomach is within normal limits. No acute vascular abnormalities are seen. Mild scattered calcification is noted along the abdominal aorta and its branches. Mild retroperitoneal stranding is nonspecific. The appendix is grossly unremarkable in appearance. The colon is grossly unremarkable. Mild  stranding about the distal sigmoid colon is nonspecific. The bladder is mildly distended and grossly unremarkable. The uterus is within normal limits. The ovaries are grossly symmetric. No suspicious adnexal masses are seen. No inguinal lymphadenopathy is seen. No acute osseous abnormalities are identified. IMPRESSION: 1. No definite acute abnormality seen to explain the patient's symptoms. 2. Minimal bibasilar airspace opacities demonstrate associated bronchiectasis and may reflect mild scarring or possibly chronic sequelae of infection. 3. Mild soft tissue stranding about the distal sigmoid colon, along the retroperitoneum and along the paracolic gutters is nonspecific but may reflect the patient's cirrhosis. 4. Findings of hepatic cirrhosis, with likely regenerative and degenerative nodules. No definite dominant mass seen. 5. Scattered coronary artery calcifications seen. 6. Mild calcification along the abdominal aorta and its branches. Electronically Signed   By: Garald Balding M.D.   On: 11/06/2015 18:39   Dg Chest 2 View  11/06/2015  CLINICAL DATA:  Chest pain, dyspnea for 3 days. EXAM: CHEST  2 VIEW COMPARISON:  July 22, 2014. FINDINGS: The heart  size and mediastinal contours are within normal limits. Both lungs are clear. No pneumothorax or pleural effusion is noted. The visualized skeletal structures are unremarkable. IMPRESSION: No active cardiopulmonary disease. Electronically Signed   By: Marijo Conception, M.D.   On: 11/06/2015 13:50   US Abdomen Limited Ruq  11/07/2015  CLINICAL DATA:  Hepatic encephalopathy.  Jaundiced. EXAM: US ABDOMEN LIMITED - RIGHT UPPER QUADRANT COMPARISON:  CT scan 11/06/2015 FINDINGS: Gallbladder: Moderate distention of the gallbladder and numerous small gallstones. No wall thickening or pericholecystic fluid. Negative sonographic Murphy sign. Common bile duct: Diameter: 3.2 mm Liver: Severe changes of cirrhosis and fatty infiltration. No focal hepatic lesions are  identified. Reversal of flow in the main portal vein suggesting severe portal hypertension. No obvious ascites. IMPRESSION: 1. Distended gallbladder with numerous small gallstones. No wall thickening, pericholecystic fluid or sonographic Murphy sign to suggest acute cholecystitis. 2. Normal caliber common bile duct. 3. Severe cirrhotic changes and fatty infiltration of the liver. 4. Reversal of flow in the main portal vein consistent with severe portal hypertension. Electronically Signed   By: Marijo Sanes M.D.   On: 11/07/2015 10:32     Medications:     . lactulose  30 g Oral Q6H  . methylPREDNISolone (SOLU-MEDROL) injection  60 mg Intravenous Q24H  . pantoprazole (PROTONIX) IV  40 mg Intravenous Q12H  . piperacillin-tazobactam (ZOSYN)  IV  3.375 g Intravenous Q12H  . potassium chloride  40 mEq Oral BID  . sodium chloride  3 mL Intravenous Q12H   morphine injection, ondansetron **OR** ondansetron (ZOFRAN) IV  Assessment/ Plan:  49 y.o. female with a PMHx of hypertension, seizure disorder,alcohol abuse, who was admitted to [facility] on 11/06/2015 for evaluation of abdominal pain, malaise, and body aches.  1. Acute renal failure. 2. Hyponatremia. 3. Hypokalemia. 4. Liver cirrhosis. 5. Hyperbilirubinemia. 6. Anemia unspecified with melena.  Plan: Good urine output noted at the moment.  However creatinine could not be calculated secondary to interference from hyperbilirubinemia.  We will restart the patient on IV fluid hydration ith a banana bag.  Serum potassium up to 3.0.  No indication for dialysis at present as the patient does have good urine output.  Hemoglobin also significantly improved up to 9.3 post transfusion. We will continue to monitor her status with you.   LOS: 2 Sharnetta Gielow 11/18/201612:43 PM

## 2015-11-09 LAB — URINALYSIS COMPLETE WITH MICROSCOPIC (ARMC ONLY)
GLUCOSE, UA: NEGATIVE mg/dL
KETONES UR: NEGATIVE mg/dL
NITRITE: NEGATIVE
Protein, ur: NEGATIVE mg/dL
SPECIFIC GRAVITY, URINE: 1.017 (ref 1.005–1.030)
pH: 5 (ref 5.0–8.0)

## 2015-11-09 LAB — TYPE AND SCREEN
ABO/RH(D): O NEG
ANTIBODY SCREEN: NEGATIVE
UNIT DIVISION: 0
UNIT DIVISION: 0

## 2015-11-09 LAB — BASIC METABOLIC PANEL
Anion gap: 8 (ref 5–15)
BUN: 39 mg/dL — ABNORMAL HIGH (ref 6–20)
CALCIUM: 9 mg/dL (ref 8.9–10.3)
CHLORIDE: 115 mmol/L — AB (ref 101–111)
CO2: 17 mmol/L — ABNORMAL LOW (ref 22–32)
CREATININE: UNDETERMINED mg/dL (ref 0.44–1.00)
Glucose, Bld: 121 mg/dL — ABNORMAL HIGH (ref 65–99)
POTASSIUM: 4.1 mmol/L (ref 3.5–5.1)
SODIUM: 140 mmol/L (ref 135–145)

## 2015-11-09 LAB — PROTIME-INR
INR: 1.7
PROTHROMBIN TIME: 20 s — AB (ref 11.4–15.0)

## 2015-11-09 NOTE — Consult Note (Signed)
Pt seen by Dr. 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 Urine positive for cocaine.  She denies vomiting and abd pain is much better.  She has urinated on her self and I asked nurse to attend to this.  Chest clear, heart RRR, abd mild obese, non tender.  BUN39  HCO3 17 TB 25 hgb 9.3   plt 146.  Urine yesterday positive for cocaine.  Plan is to repeat urine cocaine and when neg do EGD per Dr. Candace Cruise, maybe Monday.

## 2015-11-09 NOTE — Progress Notes (Signed)
[facility]  ROUNDING NOTE   Subjective:  Pt resting in bed at the moment. Cr still can't be calculated due to interference from hyperbilirubinemia. Serum bicarb a bit lower at 17.  BUN remains stable at 39. Foley catheter taken out.   Objective:  Vital signs in last 24 hours:  Temp:  [97.7 F (36.5 C)-98.1 F (36.7 C)] 97.7 F (36.5 C) (11/19 1111) Pulse Rate:  [65-70] 69 (11/19 1111) Resp:  [18-20] 20 (11/19 1111) BP: (115-137)/(61-76) 135/75 mmHg (11/19 1111) SpO2:  [97 %-100 %] 100 % (11/19 1111)  Weight change:  Filed Weights   11/06/15 1250  Weight: 97.07 kg (214 lb)    Intake/Output: I/O last 3 completed shifts: In: 2303.8 [P.O.:1720; I.V.:243.8; Blood:290; IV Piggyback:50] Out: 650 [Urine:650]   Intake/Output this shift:  Total I/O In: 942.5 [I.V.:942.5] Out: -   Physical Exam: General: NAD  Head: Normocephalic, atraumatic. Moist oral mucosal membranes  Eyes: Icterus noted  Neck: Supple, trachea midline  Lungs:  Clear to auscultation normal effort  Heart: Regular rate and rhythm  Abdomen:  Soft, nontender, BS present  Extremities:  no peripheral edema.  Neurologic: Nonfocal, moving all four extremities  Skin: No lesions       Basic Metabolic Panel:  Recent Labs Lab 11/06/15 1258 11/06/15 2207 11/07/15 0450 11/08/15 0040 11/09/15 0754  NA 130* 130* 132* 137 140  K 3.1* 2.8* 2.9* 3.0* 4.1  CL 92* 96* 101 109 115*  CO2 14* 20* 19* 19* 17*  GLUCOSE 117* 109* 88 152* 121*  BUN 39* 39* 38* 39* 39*  CREATININE UNABLE TO REPORT DUE TO ICTERIC INTERFERENCE UNABLE TO REPORT DUE TO ICTERUS  5.95* UNABLE TO REPORT DUE TO ICTERUS UNABLE TO REPORT DUE TO ICTERUS  CALCIUM 8.0* 7.3* 7.1* 7.7* 9.0  MG  --  1.3* 1.4*  --   --     Liver Function Tests:  Recent Labs Lab 11/06/15 1258 11/07/15 0450 11/08/15 0040  AST 130* 101* 94*  ALT 34 29 30  ALKPHOS 71 55 51  BILITOT 27.1* 23.2* 25.5*  PROT 9.0* 7.1 7.2  ALBUMIN 1.9* 1.6* 1.5*     Recent Labs Lab 11/06/15 1258  LIPASE 25    Recent Labs Lab 11/06/15 1535 11/07/15 0117 11/07/15 0450  AMMONIA 126* UNABLE TO CALCULATE. 59*    CBC:  Recent Labs Lab 11/06/15 1258 11/07/15 0450 11/07/15 1116 11/08/15 0040 11/08/15 0535  WBC 13.1* 11.0  --   --  10.1  HGB 8.8* 7.1* 7.1* 9.1* 9.3*  HCT 27.3* 21.6* 22.6* 27.4* 27.7*  MCV 97.3 97.6  --   --  92.9  PLT 193 156  --   --  146*    Cardiac Enzymes:  Recent Labs Lab 11/06/15 1258  TROPONINI 0.04*    BNP: Invalid input(s): POCBNP  CBG: No results for input(s): GLUCAP in the last 168 hours.  Microbiology: Results for orders placed or performed during the hospital encounter of 11/06/15  Culture, blood (routine x 2)     Status: None (Preliminary result)   Collection Time: 11/06/15  9:08 PM  Result Value Ref Range Status   Specimen Description BLOOD RIGHT HAND  Final   Special Requests BOTTLES DRAWN AEROBIC AND ANAEROBIC 5ML  Final   Culture NO GROWTH 3 DAYS  Final   Report Status PENDING  Incomplete  Culture, blood (routine x 2)     Status: None (Preliminary result)   Collection Time: 11/06/15  9:09 PM  Result Value  Ref Range Status   Specimen Description BLOOD RIGHT ANTECUBITAL  Final   Special Requests   Final    BOTTLES DRAWN AEROBIC AND ANAEROBIC 10MLANAEROBIC, 14MLAEROBIC   Culture NO GROWTH 3 DAYS  Final   Report Status PENDING  Incomplete  Urine culture     Status: None   Collection Time: 11/07/15  1:30 AM  Result Value Ref Range Status   Specimen Description URINE, CATHETERIZED  Final   Special Requests NONE  Final   Culture INSIGNIFICANT GROWTH  Final   Report Status 11/08/2015 FINAL  Final    Coagulation Studies:  Recent Labs  11/06/15 1954 11/07/15 0921 11/08/15 0535  LABPROT 22.5* 22.2* 21.5*  INR 1.99 1.96 1.88    Urinalysis:  Recent Labs  11/06/15 1911 11/08/15 2310  COLORURINE BROWN* AMBER*  LABSPEC 1.019 1.017  PHURINE 0.0* 5.0  GLUCOSEU TEST NOT  REPORTED DUE TO COLOR INTERFERENCE OF URINE PIGMENT* NEGATIVE  HGBUR TEST NOT REPORTED DUE TO COLOR INTERFERENCE OF URINE PIGMENT* 1+*  BILIRUBINUR TEST NOT REPORTED DUE TO COLOR INTERFERENCE OF URINE PIGMENT* 2+*  KETONESUR TEST NOT REPORTED DUE TO COLOR INTERFERENCE OF URINE PIGMENT* NEGATIVE  PROTEINUR TEST NOT REPORTED DUE TO COLOR INTERFERENCE OF URINE PIGMENT* NEGATIVE  NITRITE TEST NOT REPORTED DUE TO COLOR INTERFERENCE OF URINE PIGMENT* NEGATIVE  LEUKOCYTESUR TEST NOT REPORTED DUE TO COLOR INTERFERENCE OF URINE PIGMENT* 1+*      Imaging: No results found.   Medications:   . banana bag IV 1000 mL 75 mL/hr at 11/09/15 0720   . cefTRIAXone (ROCEPHIN)  IV  1 g Intravenous Q24H  . lactulose  30 g Oral Q6H  . methylPREDNISolone (SOLU-MEDROL) injection  60 mg Intravenous Q24H  . pantoprazole (PROTONIX) IV  40 mg Intravenous Q12H  . potassium chloride  40 mEq Oral BID  . sodium chloride  3 mL Intravenous Q12H   morphine injection, ondansetron **OR** ondansetron (ZOFRAN) IV  Assessment/ Plan:  49 y.o. female with a PMHx of hypertension, seizure disorder,alcohol abuse, who was admitted to [facility] on 11/06/2015 for evaluation of abdominal pain, malaise, and body aches.  1. Acute renal failure. 2. Hyponatremia. 3. Hypokalemia. 4. Liver cirrhosis. 5. Hyperbilirubinemia. 6. Anemia unspecified with melena.  Plan: Unable to tell if renal parameters improving today as theres interference between Cr and bilirubin.  However Na has improved to 140.  K is also up to 4.1.  Will d/c oral potassium supplements.  Will continue hydration at this point in time.   Hopefully her liver function will also start to improve.  Continue supportive care at this point in time.    LOS: 3 Abigail Cook 11/19/201611:56 AM

## 2015-11-09 NOTE — Progress Notes (Signed)
[facility] NAME: Abigail Cook    MR#:  [id number]  DATE OF BIRTH:  26-Sep-1966  SUBJECTIVE:  CHIEF COMPLAINT:   Chief Complaint  Patient presents with  . Abdominal Pain  . Chest Pain  . Leg Pain  . Still has some abdominal pain, but other than that denies any complaints    . Review of Systems  Constitutional: Positive for malaise/fatigue. Negative for fever, chills and weight loss.  HENT: Negative for congestion.   Eyes: Negative for blurred vision and double vision.  Respiratory: Negative for cough, sputum production, shortness of breath and wheezing.   Cardiovascular: Negative for chest pain, palpitations, orthopnea, leg swelling and PND.  Gastrointestinal: Positive for melena. Negative for nausea, vomiting, abdominal pain, diarrhea, constipation and blood in stool.  Genitourinary: Negative for dysuria, urgency, frequency and hematuria.  Musculoskeletal: Positive for myalgias and back pain. Negative for falls.  Neurological: Negative for dizziness, tremors, focal weakness and headaches.  Endo/Heme/Allergies: Does not bruise/bleed easily.  Psychiatric/Behavioral: Negative for depression. The patient does not have insomnia.     VITAL SIGNS: Blood pressure 135/75, pulse 69, temperature 97.7 F (36.5 C), temperature source Oral, resp. rate 20, height 5\' 2"  (1.575 m), weight 97.07 kg (214 lb), SpO2 100 %.  PHYSICAL EXAMINATION:   GENERAL:  49 y.o.-year-old patient lying in the bed in mild to moderate distress. Dry oral mucosa EYES: Pupils equal, round, reactive to light and accommodation. Positive scleral icterus. Extraocular muscles intact.  HEENT: Head atraumatic, normocephalic. Oropharynx and nasopharynx clear.  NECK:  Supple, no jugular venous distention. No thyroid enlargement, no tenderness.  LUNGS: Some diminished breath sounds bilaterally, no wheezing, rales,rhonchi or crepitation. No use of accessory muscles  of respiration.  CARDIOVASCULAR: S1, S2 normal. No murmurs, rubs, or gallops.  ABDOMEN: Soft mild tenderness on palpation. , nondistended. Bowel sounds present. No organomegaly or mass.  EXTREMITIES: No pedal edema, cyanosis, or clubbing. Some CVA tenderness on the left on percussion NEUROLOGIC: Cranial nerves II through XII are intact. Muscle strength 5/5 in all extremities. Sensation intact. Gait not checked.  PSYCHIATRIC: The patient is alert and oriented x 3.  SKIN: No obvious rash, lesion, or ulcer.   ORDERS/RESULTS REVIEWED:   CBC  Recent Labs Lab 11/06/15 1258 11/07/15 0450 11/07/15 1116 11/08/15 0040 11/08/15 0535  WBC 13.1* 11.0  --   --  10.1  HGB 8.8* 7.1* 7.1* 9.1* 9.3*  HCT 27.3* 21.6* 22.6* 27.4* 27.7*  PLT 193 156  --   --  146*  MCV 97.3 97.6  --   --  92.9  MCH 31.3 32.2  --   --  31.0  MCHC 32.2 33.0  --   --  33.4  RDW 28.1* 27.8*  --   --  27.5*   ------------------------------------------------------------------------------------------------------------------  Chemistries   Recent Labs Lab 11/06/15 1258 11/06/15 2207 11/07/15 0450 11/08/15 0040 11/09/15 0754  NA 130* 130* 132* 137 140  K 3.1* 2.8* 2.9* 3.0* 4.1  CL 92* 96* 101 109 115*  CO2 14* 20* 19* 19* 17*  GLUCOSE 117* 109* 88 152* 121*  BUN 39* 39* 38* 39* 39*  CREATININE UNABLE TO REPORT DUE TO ICTERIC INTERFERENCE UNABLE TO REPORT DUE TO ICTERUS  5.95* UNABLE TO REPORT DUE TO ICTERUS UNABLE TO REPORT DUE TO ICTERUS  CALCIUM 8.0* 7.3* 7.1* 7.7* 9.0  MG  --  1.3* 1.4*  --   --   AST 130*  --  101* 94*  --   ALT 34  --  29 30  --   ALKPHOS 71  --  55 51  --   BILITOT 27.1*  --  23.2* 25.5*  --    ------------------------------------------------------------------------------------------------------------------ CrCl cannot be calculated (Patient has no serum creatinine result on  file.). ------------------------------------------------------------------------------------------------------------------  Recent Labs  11/07/15 0450  TSH 0.449    Cardiac Enzymes  Recent Labs Lab 11/06/15 1258  TROPONINI 0.04*   ------------------------------------------------------------------------------------------------------------------ Invalid input(s): POCBNP ---------------------------------------------------------------------------------------------------------------  RADIOLOGY: No results found.  EKG:  Orders placed or performed during the hospital encounter of 11/06/15  . EKG 12-Lead  . EKG 12-Lead  . ED EKG within 10 minutes  . ED EKG within 10 minutes    ASSESSMENT AND PLAN:  Active Problems:   Hepatic encephalopathy ([place]) 1. Acute renal failure, continue patient on IV fluids, . Unable to accurately measure patient's renal function due to her bilirubin being high. Urine output stable appreciate nephrology input  2. Right upper quadrant abdominal pain, no cholecystitis, likely due to alcoholic hepatitis versus alcoholic gastritis, continue Protonix twice daily intravenously and steroids for alcoholic hepatitis , EGD Monday 3. Hepatic encephalopathy, continue lactulose 4. Gastrointestinal  bleed, continue Protonix twice daily intravenously. Gastroenterology consultatiois obtained. EGD on Monday as planned  5. Acute posthemorrhagic anemia , transfused 2 units of packed blood cells, improved hemoglobin level, follow  in the morning  6. Hyponatremi, Resolved with  rehydration  7.hypokalemia supplementing intravenously and orally , follow potassium  level tomorrow morning 8. Coagulopathy , due to liver disease , that is post vitamin K \ 9. Liver cirrhosis , ultrasound failed to show any masses , severe portal hypertension was noted, though ,      DRUG ALLERGIES: No Known Allergies  CODE STATUS:     Code Status Orders        Start     Ordered    11/07/15 0025  Full code   Continuous     11/07/15 0024      Time spent 35 minutes  Dustin Flock M.D on 11/09/2015 at 2:38 PM  Between 7am to 6pm - Pager - [phone]  After 6pm go to [email or web address] - password [email or web address] [facility]  [facility]  Office  [phone]  CC: Primary care physician; No PCP Per Patient

## 2015-11-09 NOTE — Progress Notes (Signed)
MD, Hower notified of patient's c/o burning sensation related to foley. Orders received to collect a urine sample and send for UA and to d/c foley.

## 2015-11-10 LAB — BASIC METABOLIC PANEL
ANION GAP: 7 (ref 5–15)
BUN: 43 mg/dL — ABNORMAL HIGH (ref 6–20)
CHLORIDE: 119 mmol/L — AB (ref 101–111)
CO2: 15 mmol/L — AB (ref 22–32)
Calcium: 9.2 mg/dL (ref 8.9–10.3)
Glucose, Bld: 116 mg/dL — ABNORMAL HIGH (ref 65–99)
Potassium: 4.5 mmol/L (ref 3.5–5.1)
Sodium: 141 mmol/L (ref 135–145)

## 2015-11-10 LAB — CBC
HEMATOCRIT: 30.9 % — AB (ref 35.0–47.0)
HEMOGLOBIN: 9.9 g/dL — AB (ref 12.0–16.0)
MCH: 30.6 pg (ref 26.0–34.0)
MCHC: 31.9 g/dL — ABNORMAL LOW (ref 32.0–36.0)
MCV: 95.9 fL (ref 80.0–100.0)
Platelets: 139 10*3/uL — ABNORMAL LOW (ref 150–440)
RBC: 3.23 MIL/uL — AB (ref 3.80–5.20)
RDW: 26.9 % — ABNORMAL HIGH (ref 11.5–14.5)
WBC: 15.3 10*3/uL — ABNORMAL HIGH (ref 3.6–11.0)

## 2015-11-10 MED ORDER — ADULT MULTIVITAMIN W/MINERALS CH
1.0000 | ORAL_TABLET | Freq: Every day | ORAL | Status: DC
  Administered 2015-11-10 – 2015-11-20 (×11): 1 via ORAL
  Filled 2015-11-10 (×11): qty 1

## 2015-11-10 MED ORDER — FOLIC ACID 1 MG PO TABS
1.0000 mg | ORAL_TABLET | Freq: Every day | ORAL | Status: DC
  Administered 2015-11-10 – 2015-11-20 (×11): 1 mg via ORAL
  Filled 2015-11-10 (×11): qty 1

## 2015-11-10 MED ORDER — DEXTROSE-NACL 5-0.9 % IV SOLN
INTRAVENOUS | Status: DC
Start: 2015-11-10 — End: 2015-11-10
  Administered 2015-11-10: 11:00:00 via INTRAVENOUS

## 2015-11-10 MED ORDER — VITAMIN B-1 100 MG PO TABS
100.0000 mg | ORAL_TABLET | Freq: Every day | ORAL | Status: DC
  Administered 2015-11-10 – 2015-11-20 (×11): 100 mg via ORAL
  Filled 2015-11-10 (×11): qty 1

## 2015-11-10 MED ORDER — SODIUM BICARBONATE 8.4 % IV SOLN
INTRAVENOUS | Status: DC
  Administered 2015-11-10 – 2015-11-13 (×5): via INTRAVENOUS
  Filled 2015-11-10 (×7): qty 150

## 2015-11-10 NOTE — Progress Notes (Signed)
[facility] NAME: Abigail Cook    MR#:  [id number]  DATE OF BIRTH:  09/14/1966  SUBJECTIVE:  CHIEF COMPLAINT:   Chief Complaint  Patient presents with  . Abdominal Pain  . Chest Pain  . Leg Pain  . Patient states that abdominal pain is resolved denies any chest pain or shortness of breath    . Review of Systems  Constitutional: Positive for malaise/fatigue. Negative for fever, chills and weight loss.  HENT: Negative for congestion.   Eyes: Negative for blurred vision and double vision.  Respiratory: Negative for cough, sputum production, shortness of breath and wheezing.   Cardiovascular: Negative for chest pain, palpitations, orthopnea, leg swelling and PND.  Gastrointestinal: Positive for melena. Negative for nausea, vomiting, abdominal pain, diarrhea, constipation and blood in stool.  Genitourinary: Negative for dysuria, urgency, frequency and hematuria.  Musculoskeletal: Positive for myalgias and back pain. Negative for falls.  Neurological: Negative for dizziness, tremors, focal weakness and headaches.  Endo/Heme/Allergies: Does not bruise/bleed easily.  Psychiatric/Behavioral: Negative for depression. The patient does not have insomnia.     VITAL SIGNS: Blood pressure 141/72, pulse 72, temperature 97.9 F (36.6 C), temperature source Oral, resp. rate 16, height 5\' 2"  (1.575 m), weight 97.07 kg (214 lb), SpO2 99 %.  PHYSICAL EXAMINATION:   GENERAL:  49 y.o.-year-old patient lying in the bed no distress chronically ill-appearing Dry oral mucosa EYES: Pupils equal, round, reactive to light and accommodation. Positive scleral icterus. Extraocular muscles intact.  HEENT: Head atraumatic, normocephalic. Oropharynx and nasopharynx clear.  NECK:  Supple, no jugular venous distention. No thyroid enlargement, no tenderness.  LUNGS: Some diminished breath sounds bilaterally, no wheezing, rales,rhonchi or crepitation.  No use of accessory muscles of respiration.  CARDIOVASCULAR: S1, S2 normal. No murmurs, rubs, or gallops.  ABDOMEN: Soft mild tenderness on palpation. , nondistended. Bowel sounds present. No organomegaly or mass.  EXTREMITIES: No pedal edema, cyanosis, or clubbing. Some CVA tenderness on the left on percussion NEUROLOGIC: Cranial nerves II through XII are intact. Muscle strength 5/5 in all extremities. Sensation intact. Gait not checked.  PSYCHIATRIC: The patient is alert and oriented x 3.  SKIN: No obvious rash, lesion, or ulcer.   ORDERS/RESULTS REVIEWED:   CBC  Recent Labs Lab 11/06/15 1258 11/07/15 0450 11/07/15 1116 11/08/15 0040 11/08/15 0535 11/10/15 0513  WBC 13.1* 11.0  --   --  10.1 15.3*  HGB 8.8* 7.1* 7.1* 9.1* 9.3* 9.9*  HCT 27.3* 21.6* 22.6* 27.4* 27.7* 30.9*  PLT 193 156  --   --  146* 139*  MCV 97.3 97.6  --   --  92.9 95.9  MCH 31.3 32.2  --   --  31.0 30.6  MCHC 32.2 33.0  --   --  33.4 31.9*  RDW 28.1* 27.8*  --   --  27.5* 26.9*   ------------------------------------------------------------------------------------------------------------------  Chemistries   Recent Labs Lab 11/06/15 1258 11/06/15 2207 11/07/15 0450 11/08/15 0040 11/09/15 0754 11/10/15 0513  NA 130* 130* 132* 137 140 141  K 3.1* 2.8* 2.9* 3.0* 4.1 4.5  CL 92* 96* 101 109 115* 119*  CO2 14* 20* 19* 19* 17* 15*  GLUCOSE 117* 109* 88 152* 121* 116*  BUN 39* 39* 38* 39* 39* 43*  CREATININE UNABLE TO REPORT DUE TO ICTERIC INTERFERENCE UNABLE TO REPORT DUE TO ICTERUS  5.95* UNABLE TO REPORT DUE TO ICTERUS UNABLE TO REPORT DUE TO ICTERUS SEE COMMENTS  CALCIUM 8.0* 7.3*  7.1* 7.7* 9.0 9.2  MG  --  1.3* 1.4*  --   --   --   AST 130*  --  101* 94*  --   --   ALT 34  --  29 30  --   --   ALKPHOS 71  --  55 51  --   --   BILITOT 27.1*  --  23.2* 25.5*  --   --    ------------------------------------------------------------------------------------------------------------------ CrCl cannot  be calculated (Patient has no serum creatinine result on file.). ------------------------------------------------------------------------------------------------------------------ No results for input(s): TSH, T4TOTAL, T3FREE, THYROIDAB in the last 72 hours.  Invalid input(s): FREET3  Cardiac Enzymes  Recent Labs Lab 11/06/15 1258  TROPONINI 0.04*   ------------------------------------------------------------------------------------------------------------------ Invalid input(s): POCBNP ---------------------------------------------------------------------------------------------------------------  RADIOLOGY: No results found.  EKG:  Orders placed or performed during the hospital encounter of 11/06/15  . EKG 12-Lead  . EKG 12-Lead  . ED EKG within 10 minutes  . ED EKG within 10 minutes    ASSESSMENT AND PLAN:  Active Problems:   Hepatic encephalopathy ([place]) 1. Acute renal failure, continue patient on IV fluids, . Unable to accurately measure patient's renal function due to her bilirubin being high. Urine output stable appreciate nephrology input 2. Right upper quadrant abdominal pain, due to alcoholic hepatitis versus alcoholic gastritis, continue Protonix twice daily intravenously and steroids for alcoholic hepatitis , EGD Monday 3. Hepatic encephalopathy, continue lactulose 4. Gastrointestinal  bleed, continue Protonix twice daily intravenously. Gastroenterology consultatiois obtained. EGD tomorrow 5. Acute posthemorrhagic anemia , status post transfusion follow hemoglobin 6. Hyponatremi, Resolved with  rehydration  7.hypokalemia supplementing intravenously and orally , follow potassium  level tomorrow morning 8. Coagulopathy , due to liver disease , that is post vitamin K \ 9. Liver cirrhosis , ultrasound failed to show any masses , severe portal hypertension was noted ,      DRUG ALLERGIES: No Known Allergies  CODE STATUS:     Code Status Orders        Start      Ordered   11/07/15 0025  Full code   Continuous     11/07/15 0024      Time spent 32 minutes  Dustin Flock M.D on 11/10/2015 at 2:04 PM  Between 7am to 6pm - Pager - [phone]  After 6pm go to [email or web address] - password [email or web address] [facility]  [facility]  Office  [phone]  CC: Primary care physician; No PCP Per Patient

## 2015-11-10 NOTE — Consult Note (Signed)
Pt doing better, more animated, she has had several BM's due to lactulose.  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 Will make her NPO for possible EGD tomorrow if her urine test is neg for cocaine in the morning.

## 2015-11-10 NOTE — Progress Notes (Signed)
Pt rested well during shift.  No signs of alcohol withdrawals.  Pt having several bm's during shift due to lactulose. Jes[place] Avers

## 2015-11-10 NOTE — Progress Notes (Signed)
[facility]  ROUNDING NOTE   Subjective:  Pt seen at bedside. Cr can't be calculated due to icterus. Worsening acidosis noted today. BUN was higher.    Objective:  Vital signs in last 24 hours:  Temp:  [97.5 F (36.4 C)-98.5 F (36.9 C)] 97.9 F (36.6 C) (11/20 0804) Pulse Rate:  [72-73] 72 (11/20 0804) Resp:  [16-18] 16 (11/20 0804) BP: (135-141)/(60-97) 141/72 mmHg (11/20 0804) SpO2:  [98 %-99 %] 99 % (11/20 0804)  Weight change:  Filed Weights   11/06/15 1250  Weight: 97.07 kg (214 lb)    Intake/Output: I/O last 3 completed shifts: In: 3336.3 [P.O.:900; I.V.:2436.3] Out: 300 [Urine:300]   Intake/Output this shift:  Total I/O In: 780 [P.O.:780] Out: -   Physical Exam: General: NAD  Head: Normocephalic, atraumatic. Moist oral mucosal membranes  Eyes: Icterus noted  Neck: Supple, trachea midline  Lungs:  Clear to auscultation normal effort  Heart: Regular rate and rhythm  Abdomen:  Soft, nontender, BS present  Extremities:  no peripheral edema.  Neurologic: Nonfocal, moving all four extremities  Skin: No lesions       Basic Metabolic Panel:  Recent Labs Lab 11/06/15 2207 11/07/15 0450 11/08/15 0040 11/09/15 0754 11/10/15 0513  NA 130* 132* 137 140 141  K 2.8* 2.9* 3.0* 4.1 4.5  CL 96* 101 109 115* 119*  CO2 20* 19* 19* 17* 15*  GLUCOSE 109* 88 152* 121* 116*  BUN 39* 38* 39* 39* 43*  CREATININE UNABLE TO REPORT DUE TO ICTERUS  5.95* UNABLE TO REPORT DUE TO ICTERUS UNABLE TO REPORT DUE TO ICTERUS SEE COMMENTS  CALCIUM 7.3* 7.1* 7.7* 9.0 9.2  MG 1.3* 1.4*  --   --   --     Liver Function Tests:  Recent Labs Lab 11/06/15 1258 11/07/15 0450 11/08/15 0040  AST 130* 101* 94*  ALT 34 29 30  ALKPHOS 71 55 51  BILITOT 27.1* 23.2* 25.5*  PROT 9.0* 7.1 7.2  ALBUMIN 1.9* 1.6* 1.5*    Recent Labs Lab 11/06/15 1258  LIPASE 25    Recent Labs Lab 11/06/15 1535 11/07/15 0117 11/07/15 0450  AMMONIA 126* UNABLE TO CALCULATE.  59*    CBC:  Recent Labs Lab 11/06/15 1258 11/07/15 0450 11/07/15 1116 11/08/15 0040 11/08/15 0535 11/10/15 0513  WBC 13.1* 11.0  --   --  10.1 15.3*  HGB 8.8* 7.1* 7.1* 9.1* 9.3* 9.9*  HCT 27.3* 21.6* 22.6* 27.4* 27.7* 30.9*  MCV 97.3 97.6  --   --  92.9 95.9  PLT 193 156  --   --  146* 139*    Cardiac Enzymes:  Recent Labs Lab 11/06/15 1258  TROPONINI 0.04*    BNP: Invalid input(s): POCBNP  CBG: No results for input(s): GLUCAP in the last 168 hours.  Microbiology: Results for orders placed or performed during the hospital encounter of 11/06/15  Culture, blood (routine x 2)     Status: None (Preliminary result)   Collection Time: 11/06/15  9:08 PM  Result Value Ref Range Status   Specimen Description BLOOD RIGHT HAND  Final   Special Requests BOTTLES DRAWN AEROBIC AND ANAEROBIC 5ML  Final   Culture NO GROWTH 4 DAYS  Final   Report Status PENDING  Incomplete  Culture, blood (routine x 2)     Status: None (Preliminary result)   Collection Time: 11/06/15  9:09 PM  Result Value Ref Range Status   Specimen Description BLOOD RIGHT ANTECUBITAL  Final   Special Requests  Final    BOTTLES DRAWN AEROBIC AND ANAEROBIC 10MLANAEROBIC, 14MLAEROBIC   Culture NO GROWTH 4 DAYS  Final   Report Status PENDING  Incomplete  Urine culture     Status: None   Collection Time: 11/07/15  1:30 AM  Result Value Ref Range Status   Specimen Description URINE, CATHETERIZED  Final   Special Requests NONE  Final   Culture INSIGNIFICANT GROWTH  Final   Report Status 11/08/2015 FINAL  Final    Coagulation Studies:  Recent Labs  11/08/15 0535 11/09/15 1555  LABPROT 21.5* 20.0*  INR 1.88 1.70    Urinalysis:  Recent Labs  11/08/15 2310  COLORURINE AMBER*  LABSPEC 1.017  PHURINE 5.0  GLUCOSEU NEGATIVE  HGBUR 1+*  BILIRUBINUR 2+*  KETONESUR NEGATIVE  PROTEINUR NEGATIVE  NITRITE NEGATIVE  LEUKOCYTESUR 1+*      Imaging: No results found.   Medications:   .  dextrose 5 % and 0.9% NaCl 75 mL/hr at 11/10/15 1042   . cefTRIAXone (ROCEPHIN)  IV  1 g Intravenous Q24H  . folic acid  1 mg Oral Daily  . lactulose  30 g Oral Q6H  . methylPREDNISolone (SOLU-MEDROL) injection  60 mg Intravenous Q24H  . multivitamin with minerals  1 tablet Oral Daily  . pantoprazole (PROTONIX) IV  40 mg Intravenous Q12H  . potassium chloride  40 mEq Oral BID  . sodium chloride  3 mL Intravenous Q12H  . thiamine  100 mg Oral Daily   morphine injection, ondansetron **OR** ondansetron (ZOFRAN) IV  Assessment/ Plan:  49 y.o. female with a PMHx of hypertension, seizure disorder,alcohol abuse, who was admitted to [facility] on 11/06/2015 for evaluation of abdominal pain, malaise, and body aches.  1. Acute renal failure. 2. Hyponatremia. 3. Hypokalemia. 4. Liver cirrhosis. 5. Hyperbilirubinemia. 6. Anemia unspecified with melena.  Plan: Cr can't be calculated due to hyperbilirubinemia and icterus.  BUN slightly higher today.  Unable to tell exact urine outpt due to foley being removed.  Acidosis developing likely due to use of saline.  Will switch to bicarb gtt.  Na and K significantly improved.  Pt counselled on ETOH cessation.  Will continue to monitor renal parameters as we are able.   LOS: 4 Torrey Ballinas 11/20/20162:08 PM

## 2015-11-11 LAB — COMPREHENSIVE METABOLIC PANEL
ALBUMIN: 1.6 g/dL — AB (ref 3.5–5.0)
ALK PHOS: 53 U/L (ref 38–126)
ALT: 51 U/L (ref 14–54)
ANION GAP: 7 (ref 5–15)
AST: 92 U/L — ABNORMAL HIGH (ref 15–41)
BUN: 41 mg/dL — ABNORMAL HIGH (ref 6–20)
CALCIUM: 9.2 mg/dL (ref 8.9–10.3)
CO2: 17 mmol/L — AB (ref 22–32)
Chloride: 116 mmol/L — ABNORMAL HIGH (ref 101–111)
GLUCOSE: 109 mg/dL — AB (ref 65–99)
Potassium: 4.9 mmol/L (ref 3.5–5.1)
SODIUM: 140 mmol/L (ref 135–145)
Total Bilirubin: 26.7 mg/dL (ref 0.3–1.2)
Total Protein: 6.9 g/dL (ref 6.5–8.1)

## 2015-11-11 LAB — URINE DRUG SCREEN, QUALITATIVE (ARMC ONLY)
AMPHETAMINES, UR SCREEN: NOT DETECTED
Barbiturates, Ur Screen: NOT DETECTED
Benzodiazepine, Ur Scrn: NOT DETECTED
Cannabinoid 50 Ng, Ur ~~LOC~~: NOT DETECTED
Cocaine Metabolite,Ur ~~LOC~~: POSITIVE — AB
MDMA (ECSTASY) UR SCREEN: NOT DETECTED
METHADONE SCREEN, URINE: NOT DETECTED
Opiate, Ur Screen: POSITIVE — AB
Phencyclidine (PCP) Ur S: NOT DETECTED
TRICYCLIC, UR SCREEN: NOT DETECTED

## 2015-11-11 MED ORDER — PREDNISONE 20 MG PO TABS
40.0000 mg | ORAL_TABLET | Freq: Every day | ORAL | Status: DC
  Administered 2015-11-12: 40 mg via ORAL
  Filled 2015-11-11: qty 2

## 2015-11-11 MED ORDER — MORPHINE SULFATE (PF) 2 MG/ML IV SOLN
2.0000 mg | Freq: Once | INTRAVENOUS | Status: AC
  Administered 2015-11-11: 2 mg via INTRAVENOUS

## 2015-11-11 NOTE — Progress Notes (Signed)
Dr. Posey Pronto paged for critical bili, increased from yesterday; RN also updated Dr regarding urine drug screen which still showed positive results for cocaine and opiates. MD acknowledged, no orders received, will continue to monitor.

## 2015-11-11 NOTE — Consult Note (Addendum)
Palliative Medicine Inpatient Consult Note   Name: Abigail Cook Date: 11/11/2015 MRN: [id number]  DOB: [birth year]  Referring Physician: Dustin Flock, MD  Palliative Care consult requested for this 49 y.o. female for goals of medical therapy in patient with alcoholic hepatitis and renal failure.  Her liver disease is quite severe with a total bilrubin of 26.7.  She has renal failure but creatining cannot be correctly calculated due to severity of liver disease.  She has a history of cocaine abuse, alcoholism, seizure disorder, and HTN and was admitted here on 11/06/15 for evaluation of abdominal pain and body aches.  She has anemia and has had melena.  She was to have an EGD today, but toxi screen was still positive for cocaine so this was cancelled.  No recent melena.   TODAY'S DISCUSSIONS AND DECISIONS: I sat with her and we talked about a lot of different things, and I included a discussion about code status AND I also felt it was important to talk honestly about her very poor prognosis.  She meets Hospice criteria for having a high liklihood of not being alive in 6 months.  She has a 1- 2 yr life expectancy just on the basis of her liver disease. But with the hepato-renal effects fully underway, that life expectancy is much, much shorter.  Unless she turns around medically, which she really doesn't appear to be doing (as yet at least)  she may only survive weeks to months, even with aggressive care.   I did tell her that she had a very poor chance of living for many more years because of the damage done to her liver and now her kidneys also.  I mentioned that there is always hope for better and that we are doing all that we can to try to give her that extra time.  Though I was vague, I thought it important for her to hear in a compassionate tone about her poor life expectancy. She indicated that she did not have any idea at all that  she was this sick.   At this point, she opened up about her  lifestyle somewhat.  See Social History below for that information.   We talked about code status and it seemed like I was starting at the basics on this, as she did not have any idea what it was about at all. She did not even have a reference to what I was talking about from tv shows (something most people have seen on tv).  So we talked a couple of times about it before I asked her what she would want.  She wants to be FULL CODE.    I have spoken with Care Mgmt, and indicated that if pt does not improve --if she remains the same for another week -- that she would be a candidate for [facility].    If she improves --she would be a candidate for Hospice in [facility]) SNF setting (not really a good rehab candidate and there is no payor source for a rehab stay anyway).  She can get therapy while here to help her transfer and become more mobile again, however, if that is possible, given her lack of improvement overall.   If she declines a stay in a SNF (under Medicaid with Hospice) then I would recommend Hospice in the Home setting.  She would probably need caregivers/ helpers -- and I am not sure her family she talks about would be able or willing to take on  more of a role than they have already.  Would want to talk with them.  She says she doesn't live with people who drink or use, but she was vague on this as well. She did not let me know if she goes to a 'party house' or if her house is a 'party house'.  I suspect that her homemay be an occasional 'party house' --though that may not be the case. If so, then having Hospice come in to that environment, would not be a good thing (unless it stopped).   And, if she becomes one who 'beats the odds' and survives this for longer than expected, then we won't stand in the way of a miracle.    I have come to these conclusions and recommendations based on my review of the literature regarding liver disease, calculating a Child's Pugh score, and examining and  talking with the patient.  I am open to further discussion with her other physicians in the event that there is more (or less) going on with her.  Additonally, I hope to focus a bit on symptom management and continuation of supportive discussions in subsequent visitss with the patient.  I have not brought up Hospice with her.  But that will follow in coming days.      IMPRESSION: 1.  Class C Cirrhosis ---with total bilirubin  (27..1 day of admission --then lower, now back up to 26.7) ---on methylprednisolone ---with Hepatic Encephalopathy --improved somewhat with lactulose ---with abdominal pain possibly related to liver disease and/ or alcoholic gastritis  ---with GI bleed (EGD held today due to cocaine in system) ---with coagulopathy (treated with Vit K) ---Ultrasound did not show masses but severe portal HTN is present 2.  Hyponatremia --treated with rehydration 3.  Hypokalemia --treated 4.  Acute on chronic renal failure ----creatinine not calculable  5.  Metabolic Acidosis 6.  Chest Pain 7.  Acute Blood Loss Anemia from GI bleed  ---transfused 8.  Hepatitis C  --- Not a candidate for treatment at this time given her degree of liver and renal failure   REVIEW OF SYSTEMS:  Patient is not able to provide ROS as she was not very talkative at first --later she spoke more, but not about ROS. She mainly just wants to get back into bed because she feels uncomfortable in the bedside chair.  SPIRITUAL SUPPORT SYSTEM: Yes  --Substance abuse meetings and also an aunt, son, and other relatives who try to help her stay off of substances.  SOCIAL HISTORY:  reports that she has quit smoking. She does not have any smokeless tobacco history on file. She reports that she drinks about 7.2 oz of alcohol per week.She tells me she grew up in a '[employer]' and that she started drinking regularly when she was 80 years old.  Her mother died in her early 33's from cirrhosis. Her father died in his early  19's of cirrhosis.  She says she had been off of alcohol for many months and that she goes for long periods of time off of it with the help of her AA meetings she was going to.  She cannot say what made her stop going to the meetings and start back drinking. She is less precise about her cocaine habit, saying that she doesn't use it every day. She indicates that she might use a single rock every few days. I pressed her a little on this, but she insisted she would only use one rock.  She doesn't seem  to think this is her major problem, but admits that alcohol has always been her battle.  Her primary care doctor died (? Dr Altha Harm) so she quit going.  She has been hospitalized here before but can't recall when or why. She has one son and he visits her. She says he does not drink at all.  She has an aunt and other relatives who do not drink and who are supportive of her when she is not drinking and who can help her stay away from it. She says, "I know I can't ever drink again."  LEGAL DOCUMENTS:  None  CODE STATUS: Full code  PAST MEDICAL HISTORY: Past Medical History  Diagnosis Date  . Hypertension   . Seizures ([place])     PAST SURGICAL HISTORY:  Past Surgical History  Procedure Laterality Date  . Rod placed in right leg/foot      ALLERGIES:  has No Known Allergies.  MEDICATIONS:  Current Facility-Administered Medications  Medication Dose Route Frequency Provider Last Rate Last Dose  . cefTRIAXone (ROCEPHIN) 1 g in dextrose 5 % 50 mL IVPB  1 g Intravenous Q24H Theodoro Grist, MD   1 g at 11/11/15 1540  . folic acid (FOLVITE) tablet 1 mg  1 mg Oral Daily Dustin Flock, MD   1 mg at 11/11/15 [id number]  . lactulose (CHRONULAC) 10 GM/15ML solution 30 g  30 g Oral Q6H Aruna Gouru, MD   30 g at 11/11/15 1540  . morphine 2 MG/ML injection 2 mg  2 mg Intravenous Q4H PRN Lytle Butte, MD   2 mg at 11/11/15 0240  . multivitamin with minerals tablet 1 tablet  1 tablet Oral Daily Dustin Flock, MD   1  tablet at 11/11/15 [phone]  . ondansetron (ZOFRAN) tablet 4 mg  4 mg Oral Q6H PRN [name], MD       Or  . ondansetron (ZOFRAN) injection 4 mg  4 mg Intravenous Q6H PRN [name], MD      . pantoprazole (PROTONIX) injection 40 mg  40 mg Intravenous Q12H Theodoro Grist, MD   40 mg at 11/11/15 0928  . [START ON 11/12/2015] predniSONE (DELTASONE) tablet 40 mg  40 mg Oral Q breakfast Dustin Flock, MD      . sodium bicarbonate 150 mEq in dextrose 5 % 1,000 mL infusion   Intravenous Continuous Munsoor Lateef, MD 75 mL/hr at 11/11/15 0826    . sodium chloride 0.9 % injection 3 mL  3 mL Intravenous Q12H [name], MD   3 mL at 11/11/15 1000  . thiamine (VITAMIN B-1) tablet 100 mg  100 mg Oral Daily Dustin Flock, MD   100 mg at 11/11/15 0928    Vital Signs: BP 112/50 mmHg  Pulse 71  Temp(Src) 97.8 F (36.6 C) (Oral)  Resp 18  Ht 5\' 2"  (1.575 m)  Wt 97.07 kg (214 lb)  BMI 39.13 kg/m2  SpO2 100% Filed Weights   11/06/15 1250  Weight: 97.07 kg (214 lb)    Estimated body mass index is 39.13 kg/(m^2) as calculated from the following:   Height as of this encounter: 5\' 2"  (1.575 m).   Weight as of this encounter: 97.07 kg (214 lb).  PERFORMANCE STATUS (ECOG) : 4 - Bedbound -chairbound  PHYSICAL EXAM: Up in bedside chair on medical floor, asking when she can get back in the bed NAD, but she has some slight obvious use of accessory muscles with breathing And she is anxious EOMI Alert and oriented  to self, situation, date, and place Neck w/o JVD or TM Hrt rrr but tachy Lungs cta anteriorly Abd soft and NT but large Ext no cyanosis of nails She has icterus  She was short with her statements but as we talked, she talked more.   LABS: CBC:    Component Value Date/Time   WBC 15.3* 11/10/2015 0513   WBC 8.6 07/26/2014 0432   HGB 9.9* 11/10/2015 0513   HGB 9.1* 07/26/2014 0432   HCT 30.9* 11/10/2015 0513   HCT 28.5* 07/26/2014 0432   PLT 139* 11/10/2015 0513   PLT 134*  07/26/2014 0432   MCV 95.9 11/10/2015 0513   MCV 76* 07/26/2014 0432   NEUTROABS 5.7 07/26/2014 0432   LYMPHSABS 1.6 07/26/2014 0432   MONOABS 1.1* 07/26/2014 0432   EOSABS 0.2 07/26/2014 0432   BASOSABS 0.1 07/26/2014 0432   BASOSABS 1 07/22/2014 1931   Comprehensive Metabolic Panel:    Component Value Date/Time   NA 140 11/11/2015 0906   NA 136 07/25/2014 1337   K 4.9 11/11/2015 0906   K 3.6 07/25/2014 1337   CL 116* 11/11/2015 0906   CL 103 07/25/2014 1337   CO2 17* 11/11/2015 0906   CO2 26 07/25/2014 1337   BUN 41* 11/11/2015 0906   BUN 7 07/25/2014 1337   CREATININE ICTERUS AT THIS LEVEL MAY AFFECT RESULT 11/11/2015 0906   CREATININE 0.91 07/25/2014 1337   GLUCOSE 109* 11/11/2015 0906   GLUCOSE 121* 07/25/2014 1337   CALCIUM 9.2 11/11/2015 0906   CALCIUM 8.7 07/25/2014 1337   AST 92* 11/11/2015 0906   AST 67* 07/22/2014 1931   ALT 51 11/11/2015 0906   ALT 38 07/22/2014 1931   ALKPHOS 53 11/11/2015 0906   ALKPHOS 60 07/22/2014 1931   BILITOT 26.7* 11/11/2015 0906   BILITOT 0.5 07/22/2014 1931   PROT 6.9 11/11/2015 0906   PROT 8.7* 07/22/2014 1931   ALBUMIN 1.6* 11/11/2015 0906   ALBUMIN 3.0* 07/22/2014 1931     More than 50% of the visit was spent in counseling/coordination of care: Yes  Time Spent:  110 minutes

## 2015-11-11 NOTE — Progress Notes (Signed)
[facility]  ROUNDING NOTE   Subjective:  Pt seen at bedside. Cr can't be calculated due to icterus. BUN remains high at 41 Total bilirubin 26.7 today   Objective:  Vital signs in last 24 hours:  Temp:  [97.8 F (36.6 C)-98.7 F (37.1 C)] 97.8 F (36.6 C) (11/21 1120) Pulse Rate:  [71-78] 71 (11/21 1120) Resp:  [18-20] 18 (11/21 1120) BP: (112-156)/(50-125) 112/50 mmHg (11/21 1120) SpO2:  [98 %-100 %] 100 % (11/21 1120)  Weight change:  Filed Weights   11/06/15 1250  Weight: 97.07 kg (214 lb)    Intake/Output: I/O last 3 completed shifts: In: [id number] [P.O.:2340; I.V.:2000] Out: 1000 [Urine:1000]   Intake/Output this shift:  Total I/O In: 760 [P.O.:760] Out: 150 [Urine:150]  Physical Exam: General: NAD  Head: Normocephalic, atraumatic. Moist oral mucosal membranes  Eyes: Icterus noted  Neck: Supple, trachea midline  Lungs:  Clear to auscultation normal effort  Heart: Regular rate and rhythm  Abdomen:  Soft, nontender, BS present  Extremities:  no peripheral edema.  Neurologic: Nonfocal, moving all four extremities  Skin: No lesions       Basic Metabolic Panel:  Recent Labs Lab 11/06/15 2207 11/07/15 0450 11/08/15 0040 11/09/15 0754 11/10/15 0513 11/11/15 0906  NA 130* 132* 137 140 141 140  K 2.8* 2.9* 3.0* 4.1 4.5 4.9  CL 96* 101 109 115* 119* 116*  CO2 20* 19* 19* 17* 15* 17*  GLUCOSE 109* 88 152* 121* 116* 109*  BUN 39* 38* 39* 39* 43* 41*  CREATININE UNABLE TO REPORT DUE TO ICTERUS  5.95* UNABLE TO REPORT DUE TO ICTERUS UNABLE TO REPORT DUE TO ICTERUS SEE COMMENTS ICTERUS AT THIS LEVEL MAY AFFECT RESULT  CALCIUM 7.3* 7.1* 7.7* 9.0 9.2 9.2  MG 1.3* 1.4*  --   --   --   --     Liver Function Tests:  Recent Labs Lab 11/06/15 1258 11/07/15 0450 11/08/15 0040 11/11/15 0906  AST 130* 101* 94* 92*  ALT 34 29 30 51  ALKPHOS 71 55 51 53  BILITOT 27.1* 23.2* 25.5* 26.7*  PROT 9.0* 7.1 7.2 6.9  ALBUMIN 1.9* 1.6* 1.5* 1.6*     Recent Labs Lab 11/06/15 1258  LIPASE 25    Recent Labs Lab 11/06/15 1535 11/07/15 0117 11/07/15 0450  AMMONIA 126* UNABLE TO CALCULATE. 59*    CBC:  Recent Labs Lab 11/06/15 1258 11/07/15 0450 11/07/15 1116 11/08/15 0040 11/08/15 0535 11/10/15 0513  WBC 13.1* 11.0  --   --  10.1 15.3*  HGB 8.8* 7.1* 7.1* 9.1* 9.3* 9.9*  HCT 27.3* 21.6* 22.6* 27.4* 27.7* 30.9*  MCV 97.3 97.6  --   --  92.9 95.9  PLT 193 156  --   --  146* 139*    Cardiac Enzymes:  Recent Labs Lab 11/06/15 1258  TROPONINI 0.04*    BNP: Invalid input(s): POCBNP  CBG: No results for input(s): GLUCAP in the last 168 hours.  Microbiology: Results for orders placed or performed during the hospital encounter of 11/06/15  Culture, blood (routine x 2)     Status: None (Preliminary result)   Collection Time: 11/06/15  9:08 PM  Result Value Ref Range Status   Specimen Description BLOOD RIGHT HAND  Final   Special Requests BOTTLES DRAWN AEROBIC AND ANAEROBIC 5ML  Final   Culture NO GROWTH 4 DAYS  Final   Report Status PENDING  Incomplete  Culture, blood (routine x 2)     Status: None (Preliminary  result)   Collection Time: 11/06/15  9:09 PM  Result Value Ref Range Status   Specimen Description BLOOD RIGHT ANTECUBITAL  Final   Special Requests   Final    BOTTLES DRAWN AEROBIC AND ANAEROBIC 10MLANAEROBIC, 14MLAEROBIC   Culture NO GROWTH 4 DAYS  Final   Report Status PENDING  Incomplete  Urine culture     Status: None   Collection Time: 11/07/15  1:30 AM  Result Value Ref Range Status   Specimen Description URINE, CATHETERIZED  Final   Special Requests NONE  Final   Culture INSIGNIFICANT GROWTH  Final   Report Status 11/08/2015 FINAL  Final    Coagulation Studies:  Recent Labs  11/09/15 1555  LABPROT 20.0*  INR 1.70    Urinalysis:  Recent Labs  11/08/15 2310  COLORURINE AMBER*  LABSPEC 1.017  PHURINE 5.0  GLUCOSEU NEGATIVE  HGBUR 1+*  BILIRUBINUR 2+*  KETONESUR  NEGATIVE  PROTEINUR NEGATIVE  NITRITE NEGATIVE  LEUKOCYTESUR 1+*      Imaging: No results found.   Medications:   .  sodium bicarbonate  infusion 1000 mL 75 mL/hr at 11/11/15 0826   . cefTRIAXone (ROCEPHIN)  IV  1 g Intravenous Q24H  . folic acid  1 mg Oral Daily  . lactulose  30 g Oral Q6H  . multivitamin with minerals  1 tablet Oral Daily  . pantoprazole (PROTONIX) IV  40 mg Intravenous Q12H  . [START ON 11/12/2015] predniSONE  40 mg Oral Q breakfast  . sodium chloride  3 mL Intravenous Q12H  . thiamine  100 mg Oral Daily   morphine injection, ondansetron **OR** ondansetron (ZOFRAN) IV  Assessment/ Plan:  49 y.o. female with a PMHx of hypertension, seizure disorder,alcohol abuse, substance abuse (cocaine) who was admitted to [facility] on 11/06/2015 for evaluation of abdominal pain, malaise, and body aches.  1. Acute renal failure. 2. Hyponatremia. 3. Hypokalemia. - improved 4. Liver cirrhosis. 5. Hyperbilirubinemia. 6. Anemia unspecified with melena.  Plan: Creatinine can't be calculated due to hyperbilirubinemia and icterus.  BUN slightly higher today.  Unable to tell exact urine outpt due to foley being removed.   Pt counselled on ETOH cessation.  Will continue to monitor renal parameters as we are able.   LOS: 5 Zyion Doxtater 11/21/20163:10 PM

## 2015-11-11 NOTE — Progress Notes (Signed)
[facility] [place] NAME: Abigail Cook    MR#:  [id number]  DATE OF BIRTH:  29-Oct-1966  SUBJECTIVE:  CHIEF COMPLAINT:   Chief Complaint  Patient presents with  . Abdominal Pain  . Chest Pain  . Leg Pain  . Patient was supposed to have a EGD today but her toxic urine drug screen still shows cocaine for therefore was canceled. Denies any further melena    . Review of Systems  Constitutional: Positive for malaise/fatigue. Negative for fever, chills and weight loss.  HENT: Negative for congestion.   Eyes: Negative for blurred vision and double vision.  Respiratory: Negative for cough, sputum production, shortness of breath and wheezing.   Cardiovascular: Negative for chest pain, palpitations, orthopnea, leg swelling and PND.  Gastrointestinal: Denies melena. Negative for nausea, vomiting, abdominal pain, diarrhea, constipation and blood in stool.  Genitourinary: Negative for dysuria, urgency, frequency and hematuria.  Musculoskeletal: Positive for myalgias and back pain. Negative for falls.  Neurological: Negative for dizziness, tremors, focal weakness and headaches.  Endo/Heme/Allergies: Does not bruise/bleed easily.  Psychiatric/Behavioral: Negative for depression. The patient does not have insomnia.     VITAL SIGNS: Blood pressure 112/50, pulse 71, temperature 97.8 F (36.6 C), temperature source Oral, resp. rate 18, height 5\' 2"  (1.575 m), weight 97.07 kg (214 lb), SpO2 100 %.  PHYSICAL EXAMINATION:   GENERAL:  49 y.o.-year-old patient lying in the bed no distress chronically ill-appearing Dry oral mucosa EYES: Pupils equal, round, reactive to light and accommodation. Positive scleral icterus. Extraocular muscles intact.  HEENT: Head atraumatic, normocephalic. Oropharynx and nasopharynx clear.  NECK:  Supple, no jugular venous distention. No thyroid enlargement, no tenderness.  LUNGS: Some diminished breath sounds  bilaterally, no wheezing, rales,rhonchi or crepitation. No use of accessory muscles of respiration.  CARDIOVASCULAR: S1, S2 normal. No murmurs, rubs, or gallops.  ABDOMEN: Soft mild tenderness on palpation. , nondistended. Bowel sounds present. No organomegaly or mass.  EXTREMITIES: No pedal edema, cyanosis, or clubbing. Some CVA tenderness on the left on percussion NEUROLOGIC: Cranial nerves II through XII are intact. Muscle strength 5/5 in all extremities. Sensation intact. Gait not checked.  PSYCHIATRIC: The patient is alert and oriented x 3.  SKIN: No obvious rash, lesion, or ulcer.   ORDERS/RESULTS REVIEWED:   CBC  Recent Labs Lab 11/06/15 1258 11/07/15 0450 11/07/15 1116 11/08/15 0040 11/08/15 0535 11/10/15 0513  WBC 13.1* 11.0  --   --  10.1 15.3*  HGB 8.8* 7.1* 7.1* 9.1* 9.3* 9.9*  HCT 27.3* 21.6* 22.6* 27.4* 27.7* 30.9*  PLT 193 156  --   --  146* 139*  MCV 97.3 97.6  --   --  92.9 95.9  MCH 31.3 32.2  --   --  31.0 30.6  MCHC 32.2 33.0  --   --  33.4 31.9*  RDW 28.1* 27.8*  --   --  27.5* 26.9*   ------------------------------------------------------------------------------------------------------------------  Chemistries   Recent Labs Lab 11/06/15 1258 11/06/15 2207 11/07/15 0450 11/08/15 0040 11/09/15 0754 11/10/15 0513 11/11/15 0906  NA 130* 130* 132* 137 140 141 140  K 3.1* 2.8* 2.9* 3.0* 4.1 4.5 4.9  CL 92* 96* 101 109 115* 119* 116*  CO2 14* 20* 19* 19* 17* 15* 17*  GLUCOSE 117* 109* 88 152* 121* 116* 109*  BUN 39* 39* 38* 39* 39* 43* 41*  CREATININE UNABLE TO REPORT DUE TO ICTERIC INTERFERENCE UNABLE TO REPORT DUE TO ICTERUS  5.95* UNABLE  TO REPORT DUE TO ICTERUS UNABLE TO REPORT DUE TO ICTERUS SEE COMMENTS ICTERUS AT THIS LEVEL MAY AFFECT RESULT  CALCIUM 8.0* 7.3* 7.1* 7.7* 9.0 9.2 9.2  MG  --  1.3* 1.4*  --   --   --   --   AST 130*  --  101* 94*  --   --  92*  ALT 34  --  29 30  --   --  51  ALKPHOS 71  --  55 51  --   --  53  BILITOT 27.1*   --  23.2* 25.5*  --   --  26.7*   ------------------------------------------------------------------------------------------------------------------ CrCl cannot be calculated (Patient has no serum creatinine result on file.). ------------------------------------------------------------------------------------------------------------------ No results for input(s): TSH, T4TOTAL, T3FREE, THYROIDAB in the last 72 hours.  Invalid input(s): FREET3  Cardiac Enzymes  Recent Labs Lab 11/06/15 1258  TROPONINI 0.04*   ------------------------------------------------------------------------------------------------------------------ Invalid input(s): POCBNP ---------------------------------------------------------------------------------------------------------------  RADIOLOGY: No results found.  EKG:  Orders placed or performed during the hospital encounter of 11/06/15  . EKG 12-Lead  . EKG 12-Lead  . ED EKG within 10 minutes  . ED EKG within 10 minutes    ASSESSMENT AND PLAN:  Active Problems:   Hepatic encephalopathy ([place]) 1. Acute renal failure, continue patient on IV fluids, . Unable to accurately measure patient's renal function due to her bilirubin being high. Continue to monitor  2. Right upper quadrant abdominal pain, due to alcoholic hepatitis versus alcoholic gastritis, continue Protonix twice daily intravenously and change to oral steroids alcoholic hepatitis ,  3. Hepatic encephalopathy, continue lactulose 4. Gastrointestinal  bleed, continue Protonix twice daily intravenously. EGD on hold  5. Acute posthemorrhagic anemia , status post transfusion follow hemoglobin  6. Hyponatremia, Resolved with  rehydration  7.hypokalemia resolved discontinue potassium 8. Coagulopathy , due to liver disease , that is post vitamin K  9. Liver cirrhosis , ultrasound failed to show any masses , severe portal hypertension was noted ,      DRUG ALLERGIES: No Known Allergies  CODE  STATUS:     Code Status Orders        Start     Ordered   11/07/15 0025  Full code   Continuous     11/07/15 0024      Time spent 32 minutes  Dustin Flock M.D on 11/11/2015 at 2:12 PM  Between 7am to 6pm - Pager - [phone]  After 6pm go to [email or web address] - password [email or web address] [facility]  [facility]  Office  [phone]  CC: Primary care physician; No PCP Per Patient

## 2015-11-11 NOTE — Care Management (Signed)
Spoke with Dr Megan Salon- palliative care.  Says patient's condition is terminal.    Still unable to measure creatinine due to critical bilirubin level.  unable to proceed with EGD because urine is still positive for cocaine.  Discussed during progression and staff do not think patient is receiving any drugs from visitors.  Physical therapy has recommended acute inpatient rehab.   At present patient's ability to tolerate 3 hours of therapy may be unlikely.    Patient will not qualify for home health physical therapy due to her medicaid.   Discharge dispositions discussed were- home with hospice, long term care in a facility under hospice care, home with home health nursing aide and social work.

## 2015-11-11 NOTE — Progress Notes (Signed)
At [phone] pt complained of sudden onset left chest pain 10/10. Pt started on 2L oxygen per [place] and given 2mg  morphine per MD order. MD notified. Will continue to monitor. Rachael Fee, RN

## 2015-11-11 NOTE — Evaluation (Signed)
Physical Therapy Evaluation Patient Details Name: Abigail Cook MRN: [id number] DOB: 01/26/1966 Today's Date: 11/11/2015   History of Present Illness  Patient presents to [facility] with R UQ pain as well as generalized body aches. Patient noted to be in ARF, hyperbilirubinemia. During this admission she has also complained of chest pain.   Clinical Impression  Patient is a 49 y/o female that until this admission had been ambulating with a RW for household distances. During this evaluation she requires mod A x1 to pull herself into sitting, after which she demonstrates good sitting balance. She demonstrates poor hand placement with RW for sit to stand transfer, but no loss of balance noted. During ambulation she demonstrates significantly decreased step lengths and at least one mild loss of balance while ambulating with RW (she was able to self correct). She fatigued quite quickly with ambulation and by the time she reached the door for her room, she requested to return to her chair (noted to be breathing heavily though O2 sats remained > 98%). She states this is "totally different" than her normal ambulation tolerance. Patient would benefit from additional PT services to address her bed mobility and ambulation deficits.     Follow Up Recommendations CIR    Equipment Recommendations       Recommendations for Other Services       Precautions / Restrictions Precautions Precautions: Fall Restrictions Weight Bearing Restrictions: No      Mobility  Bed Mobility Overal bed mobility: Needs Assistance Bed Mobility: Supine to Sit     Supine to sit: Mod assist     General bed mobility comments: Pt requires assistance via pulling on PTs arm to bring her trunk upright for sitting, no assistance required for LEs.   Transfers Overall transfer level: Needs assistance Equipment used: Rolling walker (2 wheeled) Transfers: Sit to/from Stand Sit to Stand: Supervision         General transfer  comment: Patient demonstrates poor hand position, but no balance deficits in transfer.   Ambulation/Gait Ambulation/Gait assistance: Min guard Ambulation Distance (Feet): 25 Feet Assistive device: Rolling walker (2 wheeled) Gait Pattern/deviations: Trunk flexed;Decreased step length - right;Decreased step length - left   Gait velocity interpretation: Below normal speed for age/gender General Gait Details: Patient demonstrates significantly decreased step lengths, labored breathing, and poor balance during ambulation on this evaluation. She asks to cease ambulation after making it to the door and was noted to be breathing heavily.   Stairs            Wheelchair Mobility    Modified Rankin (Stroke Patients Only)       Balance Overall balance assessment: Needs assistance Sitting-balance support: No upper extremity supported Sitting balance-Leahy Scale: Good Sitting balance - Comments: No balance deficits noted in sitting.      Standing balance-Leahy Scale: Fair Standing balance comment: No loss of balance noted during ambulation, though poor insight and judegement noted as patient lifts walker in ambulation and stumbles on one occasion,                              Pertinent Vitals/Pain Pain Assessment: No/denies pain    Home Living Family/patient expects to be discharged to:: Private residence Living Arrangements: Children Available Help at Discharge: Family (Son) Type of Home: Apartment Home Access: Stairs to enter   Technical brewer of Steps: [place]: Environmental consultant - 2 wheels      Prior  Function Level of Independence: Independent with assistive device(s)         Comments: Patient denies any falls recently with a RW     Hand Dominance        Extremity/Trunk Assessment   Upper Extremity Assessment: Overall WFL for tasks assessed           Lower Extremity Assessment: Overall WFL for tasks assessed         Communication    Communication: No difficulties (She does not elaborate beyond simple responses)  Cognition Arousal/Alertness: Awake/alert Behavior During Therapy: Anxious;WFL for tasks assessed/performed Overall Cognitive Status: Within Functional Limits for tasks assessed                      General Comments General comments (skin integrity, edema, etc.): Jaundice noted in eyes.    Exercises Other Exercises Other Exercises: Patient assisted to toilet in bathroom.       Assessment/Plan    PT Assessment Patient needs continued PT services  PT Diagnosis Difficulty walking;Generalized weakness   PT Problem List Decreased strength;Decreased mobility;Decreased safety awareness;Decreased activity tolerance;Decreased balance;Cardiopulmonary status limiting activity;Decreased knowledge of use of DME  PT Treatment Interventions DME instruction;Therapeutic activities;Therapeutic exercise;Gait training;Balance training;Stair training;Neuromuscular re-education   PT Goals (Current goals can be found in the Care Plan section) Acute Rehab PT Goals Patient Stated Goal: To return home  PT Goal Formulation: With patient Time For Goal Achievement: 11/25/15 Potential to Achieve Goals: Fair    Frequency Min 2X/week   Barriers to discharge Inaccessible home environment Patient has 17 steps to enter her house.     Co-evaluation               End of Session Equipment Utilized During Treatment: Gait belt Activity Tolerance: Patient limited by fatigue Patient left: in chair;with chair alarm set;with call bell/phone within reach Nurse Communication: Mobility status         Time: [id number] PT Time Calculation (min) (ACUTE ONLY): 15 min   Charges:   PT Evaluation $Initial PT Evaluation Tier I: 1 Procedure     PT G Codes:       Kerman Passey, PT, DPT    11/11/2015, 3:46 PM

## 2015-11-11 NOTE — Progress Notes (Signed)
Pt asking to return to bed and lie down; has only been sitting in chair for 15-20 minutes. Patient educated on importance of mobility and encouraged to sit in chair at least 30-45 minutes longer. Patient is agreeable to this and reported understanding.

## 2015-11-11 NOTE — Consult Note (Signed)
GI Inpatient Follow-up Note  Patient Identification: Abigail Cook is a 49 y.o. female with liver and renal failure.  Subjective: Events of the weekend noted. No longer has any abdominal pain now. No evidence of bleeding. Hgb stable. T. Bili remains high. Unfortunately, cocaine still in her system, probably related to her renal failure. Small amount of urine output. On Abx. Methyprednisolone started over the weekend for alcoholic hepatitis. Scheduled Inpatient Medications:  . cefTRIAXone (ROCEPHIN)  IV  1 g Intravenous Q24H  . folic acid  1 mg Oral Daily  . lactulose  30 g Oral Q6H  . methylPREDNISolone (SOLU-MEDROL) injection  60 mg Intravenous Q24H  . multivitamin with minerals  1 tablet Oral Daily  . pantoprazole (PROTONIX) IV  40 mg Intravenous Q12H  . sodium chloride  3 mL Intravenous Q12H  . thiamine  100 mg Oral Daily    Continuous Inpatient Infusions:   .  sodium bicarbonate  infusion 1000 mL 75 mL/hr at 11/11/15 0826    PRN Inpatient Medications:  morphine injection, ondansetron **OR** ondansetron (ZOFRAN) IV  Review of Systems: Constitutional: Weight is stable.  Eyes: No changes in vision. ENT: No oral lesions, sore throat.  GI: see HPI.  Heme/Lymph: No easy bruising.  CV: No chest pain.  GU: No hematuria.  Integumentary: No rashes.  Neuro: No headaches.  Psych: No depression/anxiety.  Endocrine: No heat/cold intolerance.  Allergic/Immunologic: No urticaria.  Resp: No cough, SOB.  Musculoskeletal: No joint swelling.    Physical Examination: BP 112/50 mmHg  Pulse 71  Temp(Src) 97.8 F (36.6 C) (Oral)  Resp 18  Ht 5\' 2"  (1.575 m)  Wt 97.07 kg (214 lb)  BMI 39.13 kg/m2  SpO2 100% Gen: NAD, alert and oriented x 4 HEENT: PEERLA, EOMI, Neck: supple, no JVD or thyromegaly Chest: CTA bilaterally, no wheezes, crackles, or other adventitious sounds CV: RRR, no m/g/c/r Abd: soft, nontender, ND, +BS in all four quadrants; no HSM, guarding, ridigity, or  rebound tenderness Ext: no edema, well perfused with 2+ pulses, Skin: no rash or lesions noted Lymph: no LAD  Data: Lab Results  Component Value Date   WBC 15.3* 11/10/2015   HGB 9.9* 11/10/2015   HCT 30.9* 11/10/2015   MCV 95.9 11/10/2015   PLT 139* 11/10/2015    Recent Labs Lab 11/08/15 0040 11/08/15 0535 11/10/15 0513  HGB 9.1* 9.3* 9.9*   Lab Results  Component Value Date   NA 140 11/11/2015   K 4.9 11/11/2015   CL 116* 11/11/2015   CO2 17* 11/11/2015   BUN 41* 11/11/2015   CREATININE ICTERUS AT THIS LEVEL MAY AFFECT RESULT 11/11/2015   Lab Results  Component Value Date   ALT 51 11/11/2015   AST 92* 11/11/2015   ALKPHOS 53 11/11/2015   BILITOT 26.7* 11/11/2015    Recent Labs Lab 11/07/15 0921  11/09/15 1555  APTT 44*  --   --   INR 1.96  < > 1.70  < > = values in this interval not displayed. Assessment/Plan: Abigail Cook is a 49 y.o. female with liver and renal failure. Hepatorenal syndrome?  Recommendations: Since pain is gone and no evidence of bleeding, probably ok to hold off on EGD until later. Could even be done as outpatient, since it is unclear when cocaine will be out of her system. Continue supportive care. I will be out tomorrow and Wed. Ronney Asters, PA will round on her tomorrow with Dr. Rayann Heman. thanks Please call with questions or concerns.  Meoshia Billing  Darreld Mclean, MD

## 2015-11-12 DIAGNOSIS — K922 Gastrointestinal hemorrhage, unspecified: Secondary | ICD-10-CM

## 2015-11-12 DIAGNOSIS — N19 Unspecified kidney failure: Secondary | ICD-10-CM

## 2015-11-12 DIAGNOSIS — D689 Coagulation defect, unspecified: Secondary | ICD-10-CM

## 2015-11-12 DIAGNOSIS — F1410 Cocaine abuse, uncomplicated: Secondary | ICD-10-CM

## 2015-11-12 DIAGNOSIS — K7290 Hepatic failure, unspecified without coma: Secondary | ICD-10-CM

## 2015-11-12 DIAGNOSIS — E871 Hypo-osmolality and hyponatremia: Secondary | ICD-10-CM

## 2015-11-12 DIAGNOSIS — E872 Acidosis: Secondary | ICD-10-CM

## 2015-11-12 DIAGNOSIS — K7031 Alcoholic cirrhosis of liver with ascites: Secondary | ICD-10-CM | POA: Insufficient documentation

## 2015-11-12 DIAGNOSIS — D500 Iron deficiency anemia secondary to blood loss (chronic): Secondary | ICD-10-CM

## 2015-11-12 DIAGNOSIS — R079 Chest pain, unspecified: Secondary | ICD-10-CM

## 2015-11-12 DIAGNOSIS — I10 Essential (primary) hypertension: Secondary | ICD-10-CM

## 2015-11-12 DIAGNOSIS — R109 Unspecified abdominal pain: Secondary | ICD-10-CM

## 2015-11-12 DIAGNOSIS — G40909 Epilepsy, unspecified, not intractable, without status epilepticus: Secondary | ICD-10-CM

## 2015-11-12 DIAGNOSIS — F1010 Alcohol abuse, uncomplicated: Secondary | ICD-10-CM

## 2015-11-12 DIAGNOSIS — K921 Melena: Secondary | ICD-10-CM

## 2015-11-12 DIAGNOSIS — Z515 Encounter for palliative care: Secondary | ICD-10-CM

## 2015-11-12 DIAGNOSIS — Z79899 Other long term (current) drug therapy: Secondary | ICD-10-CM

## 2015-11-12 DIAGNOSIS — K7010 Alcoholic hepatitis without ascites: Secondary | ICD-10-CM

## 2015-11-12 DIAGNOSIS — E876 Hypokalemia: Secondary | ICD-10-CM

## 2015-11-12 DIAGNOSIS — N179 Acute kidney failure, unspecified: Secondary | ICD-10-CM

## 2015-11-12 LAB — CBC
HEMATOCRIT: 29 % — AB (ref 35.0–47.0)
HEMOGLOBIN: 9.3 g/dL — AB (ref 12.0–16.0)
MCH: 31.1 pg (ref 26.0–34.0)
MCHC: 32.1 g/dL (ref 32.0–36.0)
MCV: 96.9 fL (ref 80.0–100.0)
Platelets: 89 10*3/uL — ABNORMAL LOW (ref 150–440)
RBC: 2.99 MIL/uL — ABNORMAL LOW (ref 3.80–5.20)
RDW: 27.2 % — AB (ref 11.5–14.5)
WBC: 16.2 10*3/uL — ABNORMAL HIGH (ref 3.6–11.0)

## 2015-11-12 LAB — BASIC METABOLIC PANEL
ANION GAP: 6 (ref 5–15)
BUN: 39 mg/dL — AB (ref 6–20)
CHLORIDE: 112 mmol/L — AB (ref 101–111)
CO2: 22 mmol/L (ref 22–32)
Calcium: 9.1 mg/dL (ref 8.9–10.3)
Creatinine, Ser: UNDETERMINED mg/dL (ref 0.44–1.00)
GLUCOSE: 87 mg/dL (ref 65–99)
POTASSIUM: 4.2 mmol/L (ref 3.5–5.1)
Sodium: 140 mmol/L (ref 135–145)

## 2015-11-12 LAB — C DIFFICILE QUICK SCREEN W PCR REFLEX
C DIFFICILE (CDIFF) INTERP: NEGATIVE
C DIFFICILE (CDIFF) TOXIN: NEGATIVE
C Diff antigen: NEGATIVE

## 2015-11-12 MED ORDER — DEXTROSE 5 % IV SOLN
1.0000 g | INTRAVENOUS | Status: DC
  Filled 2015-11-12: qty 10

## 2015-11-12 MED ORDER — LACTULOSE 10 GM/15ML PO SOLN
30.0000 g | Freq: Two times a day (BID) | ORAL | Status: DC
  Administered 2015-11-12 – 2015-11-13 (×2): 30 g via ORAL
  Filled 2015-11-12 (×2): qty 60

## 2015-11-12 MED ORDER — LACTULOSE 10 GM/15ML PO SOLN
30.0000 g | Freq: Four times a day (QID) | ORAL | Status: DC
  Administered 2015-11-12 (×2): 30 g via ORAL
  Filled 2015-11-12 (×5): qty 45
  Filled 2015-11-12: qty 60
  Filled 2015-11-12: qty 45

## 2015-11-12 MED ORDER — PREDNISOLONE 15 MG/5ML PO SOLN
40.0000 mg | Freq: Every day | ORAL | Status: DC
  Administered 2015-11-13 – 2015-11-20 (×8): 40 mg via ORAL
  Filled 2015-11-12 (×9): qty 15

## 2015-11-12 NOTE — Progress Notes (Addendum)
[facility] [place] NAME: Abigail Cook    MR#:  [id number]  DATE OF BIRTH:  1966-05-06  SUBJECTIVE:  CHIEF COMPLAINT:   Chief Complaint  Patient presents with  . Abdominal Pain  . Chest Pain  . Leg Pain   Denies any abdominal pain, denies nausea vomiting or diarrhea    . Review of Systems  Constitutional: Positive for malaise/fatigue. Negative for fever, chills and weight loss.  HENT: Negative for congestion.   Eyes: Negative for blurred vision and double vision.  Respiratory: Negative for cough, sputum production, shortness of breath and wheezing.   Cardiovascular: Negative for chest pain, palpitations, orthopnea, leg swelling and PND.  Gastrointestinal: Denies melena. Negative for nausea, vomiting, abdominal pain, diarrhea, constipation and blood in stool.  Genitourinary: Negative for dysuria, urgency, frequency and hematuria.  Musculoskeletal: Positive for myalgias and back pain. Negative for falls.  Neurological: Negative for dizziness, tremors, focal weakness and headaches.  Endo/Heme/Allergies: Does not bruise/bleed easily.  Psychiatric/Behavioral: Negative for depression. The patient does not have insomnia.     VITAL SIGNS: Blood pressure 100/60, pulse 77, temperature 98.5 F (36.9 C), temperature source Oral, resp. rate 18, height 5\' 2"  (1.575 m), weight 97.07 kg (214 lb), SpO2 98 %.  PHYSICAL EXAMINATION:   GENERAL:  49 y.o.-year-old patient lying in the bed no distress chronically ill-appearing Dry oral mucosa EYES: Pupils equal, round, reactive to light and accommodation. Positive scleral icterus. Extraocular muscles intact.  HEENT: Head atraumatic, normocephalic. Oropharynx and nasopharynx clear.  NECK:  Supple, no jugular venous distention. No thyroid enlargement, no tenderness.  LUNGS: Some diminished breath sounds bilaterally, no wheezing, rales,rhonchi or crepitation. No use of accessory muscles of  respiration.  CARDIOVASCULAR: S1, S2 normal. No murmurs, rubs, or gallops.  ABDOMEN: Soft mild tenderness on palpation. , nondistended. Bowel sounds present. No organomegaly or mass.  EXTREMITIES: No pedal edema, cyanosis, or clubbing. Some CVA tenderness on the left on percussion NEUROLOGIC: Cranial nerves II through XII are intact. Muscle strength 5/5 in all extremities. Sensation intact. Gait not checked.  PSYCHIATRIC: The patient is alert and oriented x 3.  SKIN: No obvious rash, lesion, or ulcer.   ORDERS/RESULTS REVIEWED:   CBC  Recent Labs Lab 11/06/15 1258 11/07/15 0450 11/07/15 1116 11/08/15 0040 11/08/15 0535 11/10/15 0513 11/12/15 0512  WBC 13.1* 11.0  --   --  10.1 15.3* 16.2*  HGB 8.8* 7.1* 7.1* 9.1* 9.3* 9.9* 9.3*  HCT 27.3* 21.6* 22.6* 27.4* 27.7* 30.9* 29.0*  PLT 193 156  --   --  146* 139* 89*  MCV 97.3 97.6  --   --  92.9 95.9 96.9  MCH 31.3 32.2  --   --  31.0 30.6 31.1  MCHC 32.2 33.0  --   --  33.4 31.9* 32.1  RDW 28.1* 27.8*  --   --  27.5* 26.9* 27.2*   ------------------------------------------------------------------------------------------------------------------  Chemistries   Recent Labs Lab 11/06/15 1258 11/06/15 2207 11/07/15 0450 11/08/15 0040 11/09/15 0754 11/10/15 0513 11/11/15 0906 11/12/15 0512  NA 130* 130* 132* 137 140 141 140 140  K 3.1* 2.8* 2.9* 3.0* 4.1 4.5 4.9 4.2  CL 92* 96* 101 109 115* 119* 116* 112*  CO2 14* 20* 19* 19* 17* 15* 17* 22  GLUCOSE 117* 109* 88 152* 121* 116* 109* 87  BUN 39* 39* 38* 39* 39* 43* 41* 39*  CREATININE UNABLE TO REPORT DUE TO ICTERIC INTERFERENCE UNABLE TO REPORT DUE TO ICTERUS  5.95* UNABLE TO REPORT DUE TO ICTERUS UNABLE TO REPORT DUE TO ICTERUS SEE COMMENTS ICTERUS AT THIS LEVEL MAY AFFECT RESULT UNABLE TO REPORT DUE TO SEVERE ICTERUS  CALCIUM 8.0* 7.3* 7.1* 7.7* 9.0 9.2 9.2 9.1  MG  --  1.3* 1.4*  --   --   --   --   --   AST 130*  --  101* 94*  --   --  92*  --   ALT 34  --  29 30  --    --  51  --   ALKPHOS 71  --  55 51  --   --  53  --   BILITOT 27.1*  --  23.2* 25.5*  --   --  26.7*  --    ------------------------------------------------------------------------------------------------------------------ CrCl cannot be calculated (Patient has no serum creatinine result on file.). ------------------------------------------------------------------------------------------------------------------ No results for input(s): TSH, T4TOTAL, T3FREE, THYROIDAB in the last 72 hours.  Invalid input(s): FREET3  Cardiac Enzymes  Recent Labs Lab 11/06/15 1258  TROPONINI 0.04*   ------------------------------------------------------------------------------------------------------------------ Invalid input(s): POCBNP ---------------------------------------------------------------------------------------------------------------  RADIOLOGY: No results found.  EKG:  Orders placed or performed during the hospital encounter of 11/06/15  . EKG 12-Lead  . EKG 12-Lead  . ED EKG within 10 minutes  . ED EKG within 10 minutes    ASSESSMENT AND PLAN:  1. Acute renal failure,  . Unable to accurately measure patient's renal function due to her bilirubin being high. Continue to monitor IV fluids stopped, urine output adequate 2. Right upper quadrant abdominal pain, due to alcoholic hepatitis versus alcoholic gastritis, continue Protonix twice daily intravenously and change to oral steroids alcoholic hepatitis ,  3. Hepatic encephalopathy, continue lactulose 4. Gastrointestinal  bleed, due to cocaine being positive in urine drug screen 5. Acute posthemorrhagic anemia , status post transfusion hgb stable 6. Hyponatremia, Resolved with  rehydration  7.hypokalemia resolved  8. Coagulopathy , due to liver disease , that is post vitamin K  9. Liver cirrhosis , ultrasound failed to show any masses , severe portal hypertension was noted ,      DRUG ALLERGIES: No Known Allergies  CODE  STATUS:     Code Status Orders        Start     Ordered   11/07/15 0025  Full code   Continuous     11/07/15 0024      Time spent 25 minutes  Dustin Flock M.D on 11/12/2015 at 1:43 PM  Between 7am to 6pm - Pager - [phone]  After 6pm go to [email or web address] - password [email or web address] [facility]  [facility]  Office  [phone]  CC: Primary care physician; No PCP Per Patient

## 2015-11-12 NOTE — Progress Notes (Signed)
PT Cancellation Note  Patient Details Name: Abigail Cook MRN: [id number] DOB: 1966/10/29   Cancelled Treatment:    Reason Eval/Treat Not Completed: Patient declined, no reason specified. Patient refused any mobility session, no reason given aside from not feeling up to it. PT will continue to make attempts as tolerated to increase her current mobility level.   Kerman Passey, PT, DPT    11/12/2015, 4:02 PM

## 2015-11-12 NOTE — Progress Notes (Signed)
GI Inpatient Follow-up Note  Patient Identification: Abigail Cook is a 49 y.o. female admitted for cirrhosis.   Subjective: Abdominal pain some better than yesterday, ate 75% of regular breakfast tray. Does complain of 5+ liquid stools/day. No nausea/vomiting.   Scheduled Inpatient Medications:  . cefTRIAXone (ROCEPHIN)  IV  1 g Intravenous Q24H  . folic acid  1 mg Oral Daily  . lactulose  30 g Oral Q6H  . multivitamin with minerals  1 tablet Oral Daily  . pantoprazole (PROTONIX) IV  40 mg Intravenous Q12H  . predniSONE  40 mg Oral Q breakfast  . sodium chloride  3 mL Intravenous Q12H  . thiamine  100 mg Oral Daily    Continuous Inpatient Infusions:   .  sodium bicarbonate  infusion 1000 mL 75 mL/hr at 11/11/15 2328    PRN Inpatient Medications:  morphine injection, ondansetron **OR** ondansetron (ZOFRAN) IV  Review of Systems: Constitutional: Weight is stable.  Eyes: No changes in vision. ENT: No oral lesions, sore throat.  GI: see HPI.  Heme/Lymph: No easy bruising.  CV: No chest pain.  GU: No hematuria.  Integumentary: No rashes.  Neuro: No headaches.  Psych: No depression/anxiety.  Endocrine: No heat/cold intolerance.  Allergic/Immunologic: No urticaria.  Resp: No cough, SOB.  Musculoskeletal: No joint swelling.    Physical Examination: BP 103/49 mmHg  Pulse 83  Temp(Src) 98.3 F (36.8 C) (Oral)  Resp 20  Ht 5\' 2"  (1.575 m)  Wt 214 lb (97.07 kg)  BMI 39.13 kg/m2  SpO2 99% Gen: NAD, alert and oriented x 4 HEENT: PEERLA, EOMI, + scleral icterus  Neck: supple, no JVD or thyromegaly Chest: CTA bilaterally, no wheezes, crackles, or other adventitious sounds CV: RRR, no m/g/c/r Abd: TTP in RUQ, + ascites (unchanged from yesterdays exam),  Ext: no edema, well perfused with 2+ pulses. No asterixis.  Skin: no rash or lesions noted Lymph: no LAD  Data: Lab Results  Component Value Date   WBC 16.2* 11/12/2015   HGB 9.3* 11/12/2015   HCT 29.0*  11/12/2015   MCV 96.9 11/12/2015   PLT 89* 11/12/2015    Recent Labs Lab 11/08/15 0535 11/10/15 0513 11/12/15 0512  HGB 9.3* 9.9* 9.3*   Lab Results  Component Value Date   NA 140 11/12/2015   K 4.2 11/12/2015   CL 112* 11/12/2015   CO2 22 11/12/2015   BUN 39* 11/12/2015   CREATININE UNABLE TO REPORT DUE TO SEVERE ICTERUS 11/12/2015   Lab Results  Component Value Date   ALT 51 11/11/2015   AST 92* 11/11/2015   ALKPHOS 53 11/11/2015   BILITOT 26.7* 11/11/2015    Recent Labs Lab 11/07/15 0921  11/09/15 1555  APTT 44*  --   --   INR 1.96  < > 1.70  < > = values in this interval not displayed. Assessment/Plan: Ms. Schoenwald is a 49 y.o. female with PMHX of cirrhosis admitted for HE, weakness, N/V.   Recommendations:   1. Cirrhosis/alcoholic hepatitis - 2/2 alcohol and hepatitis C. On steroids for DF 33, change to prednisolone 40 mg instead of prednisone 40 mg to bypass liver metabolism. Will stop Rocephin which was started for SBP prophylaxis although no ascites on imaging. Unable to calc MELD given can't calculate Cr but high risk for short term mortality as discussed by hospice yesterday.   2. Anemia - Hgb has remained stable since admission, unable to do EGD given + cocaine on repeat UDS, unable to clear 2/2 hepatorenal. Consider  EGD/colonoscopy as outpatient dependent on overall clinical course.   3. Leukocytosis - most likely related to steroids vs. C. Diff, see below.   4. HE - mental status improved w/ lactulose 30gm QID. No obvious signs of HE. Diarrhea 5+ stools with QID dosing of lactulose, decrease to BID with goal of 2-3 stools/day.   5. Diarrhea - most likely due to high lactulose dose but with leukocytosis and stranding on CT of distal sigmoid will check stool for C. Diff.    Case discussed w/ Dr. Rayann Heman. Please call with questions or concerns.    Ronney Asters, PA-C [place]

## 2015-11-12 NOTE — Clinical Social Work Note (Signed)
Clinical Social Work Assessment  Patient Details  Name: Abigail Cook MRN: [id number] Date of Birth: Aug 26, 1966  Date of referral:  11/12/15               Reason for consult:  Facility Placement                Permission sought to share information with:    Permission granted to share information::     Name::        Agency::     Relationship::     Contact Information:     Housing/Transportation Living arrangements for the past 2 months:  Single Family Home Source of Information:  Patient Patient Interpreter Needed:  None Criminal Activity/Legal Involvement Pertinent to Current Situation/Hospitalization:  No - Comment as needed Significant Relationships:  None Lives with:  Adult Children Do you feel safe going back to the place where you live?  Yes Need for family participation in patient care:  Yes (Comment)  Care giving concerns:  Patient lives at home and states her adult son is in the home with her.   Social Worker assessment / plan:  CSW informed during progression rounds that patient is from home and that PT had recommended CIR. CIR has declined patient. CSW reviewed Palliative Care documentation and spoke with patient this morning. Patient is not interested in going to a nursing home. Patient clearly stated she will be returning home. When asked if she has anyone in the home that could assist her, she stated that her adult son lives in the home and stated that he can help her if she needs help. Difficult to determine if she wishes to return home so that she can resume her cocaine use (which she has told staff she will continue to abuse cocaine) or she simply desires to be in her home environment or both. CSW will continue to follow.  Employment status:  Unemployed Forensic scientist:  Medicaid [place] PT Recommendations:  Inpatient Rehab Consult Information / Referral to community resources:     Patient/Family's Response to care:  Patient pleasant and  cooperative.  Patient/Family's Understanding of and Emotional Response to Diagnosis, Current Treatment, and Prognosis:  Patient wishes to return home.  Emotional Assessment Appearance:  Appears older than stated age Attitude/Demeanor/Rapport:   (quiet but cooperative) Affect (typically observed):  Calm, Appropriate Orientation:  Oriented to Self, Oriented to Place, Oriented to Situation Alcohol / Substance use:  Illicit Drugs Psych involvement (Current and /or in the community):  No (Comment)  Discharge Needs  Concerns to be addressed:  Care Coordination Readmission within the last 30 days:  No Current discharge risk:  None Barriers to Discharge:  No Barriers Identified   Shela Leff, LCSW 11/12/2015, 11:52 AM

## 2015-11-12 NOTE — Progress Notes (Signed)
Pt being transferred to unit [place] room 206. Transported by orderly, Ronalee Belts. Report called to [facility] RN. Patient to call family to notify of room change. Rachael Fee, RN

## 2015-11-12 NOTE — Progress Notes (Signed)
Rehab Admissions Coordinator Note:  Patient was screened by Cleatrice Burke for appropriateness for an Inpatient Acute Rehab Consult per PT recommendation.  At this time, we are recommending [place] with Hospice , Hospice Home or Home with Hospice and family support at this time. Noted palliative consult and recommendations. I will update RN CM. Please call me with any questions.  Cleatrice Burke 11/12/2015, 7:34 AM  I can be reached at [phone].

## 2015-11-12 NOTE — Care Management (Signed)
Assessment for acute inpatient rehab has been performed and deemed not to meet criteria.  Reported off to [facility] unit caremanager

## 2015-11-12 NOTE — Progress Notes (Signed)
Nutrition Follow-up     INTERVENTION:  Meals and snacks: Cater to pt preferences Medical Nutrition Supplement Therapy: continues carnation instant breakfast BID between meals for added nutrition   NUTRITION DIAGNOSIS:   Inadequate oral intake related to acute illness as evidenced by per patient/family report.    GOAL:   Patient will meet greater than or equal to 90% of their needs    MONITOR:    (Energy Intake, Anthropometrics, Digestive System, Electrolyte/Renal Profile)  REASON FOR ASSESSMENT:   Diagnosis    ASSESSMENT:      Current Nutrition: eating 100% of meals and drinking supplement per pt     Scheduled Medications:  . cefTRIAXone (ROCEPHIN)  IV  1 g Intravenous Q24H  . folic acid  1 mg Oral Daily  . lactulose  30 g Oral Q6H  . multivitamin with minerals  1 tablet Oral Daily  . pantoprazole (PROTONIX) IV  40 mg Intravenous Q12H  . predniSONE  40 mg Oral Q breakfast  . sodium chloride  3 mL Intravenous Q12H  . thiamine  100 mg Oral Daily    Continuous Medications:  .  sodium bicarbonate  infusion 1000 mL 75 mL/hr at 11/11/15 2328     Electrolyte/Renal Profile and Glucose Profile:   Recent Labs Lab 11/06/15 2207 11/07/15 0450  11/10/15 0513 11/11/15 0906 11/12/15 0512  NA 130* 132*  < > 141 140 140  K 2.8* 2.9*  < > 4.5 4.9 4.2  CL 96* 101  < > 119* 116* 112*  CO2 20* 19*  < > 15* 17* 22  BUN 39* 38*  < > 43* 41* 39*  CREATININE UNABLE TO REPORT DUE TO ICTERUS  5.95*  < > SEE COMMENTS ICTERUS AT THIS LEVEL MAY AFFECT RESULT UNABLE TO REPORT DUE TO SEVERE ICTERUS  CALCIUM 7.3* 7.1*  < > 9.2 9.2 9.1  MG 1.3* 1.4*  --   --   --   --   GLUCOSE 109* 88  < > 116* 109* 87  < > = values in this interval not displayed. Protein Profile:   Recent Labs Lab 11/07/15 0450 11/08/15 0040 11/11/15 0906  ALBUMIN 1.6* 1.5* 1.6*    Gastrointestinal Profile: Last BM:11/22    Diet Order:  Diet 2 gram sodium Room service appropriate?: Yes; Fluid  consistency:: Thin  Skin:   (jaundice, no wounds)   Height:   Ht Readings from Last 1 Encounters:  11/06/15 5\' 2"  (1.575 m)    Weight:   Wt Readings from Last 1 Encounters:  11/06/15 214 lb (97.07 kg)    Ideal Body Weight:     BMI:  Body mass index is 39.13 kg/(m^2).  Estimated Nutritional Needs:   Kcal:  1500-1750 kcals   Protein:  50-60 g (1.0-1.2 gkg)   Fluid:  1250-1500 mL   EDUCATION NEEDS:   No education needs identified at this time  LOW Care Level  Ras Kollman B. Zenia Resides, [place], [place] (pager)

## 2015-11-12 NOTE — Progress Notes (Signed)
[facility]  ROUNDING NOTE   Subjective:  Pt seen at bedside. Cr can't be calculated due to icterus. BUN remains high at 39 Total bilirubin 26.7 at last check   Objective:  Vital signs in last 24 hours:  Temp:  [97.2 F (36.2 C)-98.3 F (36.8 C)] 98.3 F (36.8 C) (11/22 0433) Pulse Rate:  [71-83] 83 (11/22 0433) Resp:  [18-20] 20 (11/22 0433) BP: (95-117)/(49-60) 103/49 mmHg (11/22 0433) SpO2:  [99 %-100 %] 99 % (11/22 0433)  Weight change:  Filed Weights   11/06/15 1250  Weight: 97.07 kg (214 lb)    Intake/Output: I/O last 3 completed shifts: In: 2697.3 [P.O.:1300; I.V.:1347.3; IV Piggyback:50] Out: 450 [Urine:450]   Intake/Output this shift:  Total I/O In: 340 [I.V.:340] Out: -   Physical Exam: General: NAD  Head: Normocephalic, atraumatic. Moist oral mucosal membranes  Eyes: Icterus noted  Neck: Supple, trachea midline  Lungs:  Clear to auscultation normal effort  Heart: Regular rate and rhythm  Abdomen:  Soft, nontender, BS present  Extremities:  no peripheral edema.  Neurologic: Nonfocal, moving all four extremities  Skin: No lesions       Basic Metabolic Panel:  Recent Labs Lab 11/06/15 2207 11/07/15 0450 11/08/15 0040 11/09/15 0754 11/10/15 0513 11/11/15 0906 11/12/15 0512  NA 130* 132* 137 140 141 140 140  K 2.8* 2.9* 3.0* 4.1 4.5 4.9 4.2  CL 96* 101 109 115* 119* 116* 112*  CO2 20* 19* 19* 17* 15* 17* 22  GLUCOSE 109* 88 152* 121* 116* 109* 87  BUN 39* 38* 39* 39* 43* 41* 39*  CREATININE UNABLE TO REPORT DUE TO ICTERUS  5.95* UNABLE TO REPORT DUE TO ICTERUS UNABLE TO REPORT DUE TO ICTERUS SEE COMMENTS ICTERUS AT THIS LEVEL MAY AFFECT RESULT UNABLE TO REPORT DUE TO SEVERE ICTERUS  CALCIUM 7.3* 7.1* 7.7* 9.0 9.2 9.2 9.1  MG 1.3* 1.4*  --   --   --   --   --     Liver Function Tests:  Recent Labs Lab 11/06/15 1258 11/07/15 0450 11/08/15 0040 11/11/15 0906  AST 130* 101* 94* 92*  ALT 34 29 30 51  ALKPHOS 71 55 51 53   BILITOT 27.1* 23.2* 25.5* 26.7*  PROT 9.0* 7.1 7.2 6.9  ALBUMIN 1.9* 1.6* 1.5* 1.6*    Recent Labs Lab 11/06/15 1258  LIPASE 25    Recent Labs Lab 11/06/15 1535 11/07/15 0117 11/07/15 0450  AMMONIA 126* UNABLE TO CALCULATE. 59*    CBC:  Recent Labs Lab 11/06/15 1258 11/07/15 0450 11/07/15 1116 11/08/15 0040 11/08/15 0535 11/10/15 0513 11/12/15 0512  WBC 13.1* 11.0  --   --  10.1 15.3* 16.2*  HGB 8.8* 7.1* 7.1* 9.1* 9.3* 9.9* 9.3*  HCT 27.3* 21.6* 22.6* 27.4* 27.7* 30.9* 29.0*  MCV 97.3 97.6  --   --  92.9 95.9 96.9  PLT 193 156  --   --  146* 139* 89*    Cardiac Enzymes:  Recent Labs Lab 11/06/15 1258  TROPONINI 0.04*    BNP: Invalid input(s): POCBNP  CBG: No results for input(s): GLUCAP in the last 168 hours.  Microbiology: Results for orders placed or performed during the hospital encounter of 11/06/15  Culture, blood (routine x 2)     Status: None (Preliminary result)   Collection Time: 11/06/15  9:08 PM  Result Value Ref Range Status   Specimen Description BLOOD RIGHT HAND  Final   Special Requests BOTTLES DRAWN AEROBIC AND ANAEROBIC 5ML  Final   Culture NO GROWTH 4 DAYS  Final   Report Status PENDING  Incomplete  Culture, blood (routine x 2)     Status: None (Preliminary result)   Collection Time: 11/06/15  9:09 PM  Result Value Ref Range Status   Specimen Description BLOOD RIGHT ANTECUBITAL  Final   Special Requests   Final    BOTTLES DRAWN AEROBIC AND ANAEROBIC 10MLANAEROBIC, 14MLAEROBIC   Culture NO GROWTH 4 DAYS  Final   Report Status PENDING  Incomplete  Urine culture     Status: None   Collection Time: 11/07/15  1:30 AM  Result Value Ref Range Status   Specimen Description URINE, CATHETERIZED  Final   Special Requests NONE  Final   Culture INSIGNIFICANT GROWTH  Final   Report Status 11/08/2015 FINAL  Final    Coagulation Studies:  Recent Labs  11/09/15 1555  LABPROT 20.0*  INR 1.70    Urinalysis: No results for  input(s): COLORURINE, LABSPEC, PHURINE, GLUCOSEU, HGBUR, BILIRUBINUR, KETONESUR, PROTEINUR, UROBILINOGEN, NITRITE, LEUKOCYTESUR in the last 72 hours.  Invalid input(s): APPERANCEUR    Imaging: No results found.   Medications:   .  sodium bicarbonate  infusion 1000 mL 75 mL/hr at 11/11/15 2328   . cefTRIAXone (ROCEPHIN)  IV  1 g Intravenous Q24H  . folic acid  1 mg Oral Daily  . lactulose  30 g Oral Q6H  . multivitamin with minerals  1 tablet Oral Daily  . pantoprazole (PROTONIX) IV  40 mg Intravenous Q12H  . predniSONE  40 mg Oral Q breakfast  . sodium chloride  3 mL Intravenous Q12H  . thiamine  100 mg Oral Daily   morphine injection, ondansetron **OR** ondansetron (ZOFRAN) IV  Assessment/ Plan:  49 y.o. female with a PMHx of hypertension, seizure disorder,alcohol abuse, substance abuse (cocaine) who was admitted to [facility] on 11/06/2015 for evaluation of abdominal pain, malaise, and body aches.  1. Acute renal failure. 2. Hyponatremia. 3. Hypokalemia. - improved 4. Liver cirrhosis. 5. Hyperbilirubinemia. 6. Anemia unspecified with melena.  Plan: Creatinine can't be calculated due to hyperbilirubinemia and icterus.  BUN appears to be in similar range last 3-4 days.   UOP not accurately measured Pt counselled on ETOH cessation this admission Will continue to monitor renal parameters as we are able.   LOS: 6 Saman Giddens 11/22/201610:52 AM

## 2015-11-13 LAB — COMPREHENSIVE METABOLIC PANEL
ALT: 51 U/L (ref 14–54)
AST: 90 U/L — AB (ref 15–41)
Albumin: 1.5 g/dL — ABNORMAL LOW (ref 3.5–5.0)
Alkaline Phosphatase: 53 U/L (ref 38–126)
Anion gap: 8 (ref 5–15)
BUN: 38 mg/dL — AB (ref 6–20)
CHLORIDE: 102 mmol/L (ref 101–111)
CO2: 26 mmol/L (ref 22–32)
Calcium: 8.5 mg/dL — ABNORMAL LOW (ref 8.9–10.3)
Creatinine, Ser: UNDETERMINED mg/dL (ref 0.44–1.00)
Glucose, Bld: 89 mg/dL (ref 65–99)
POTASSIUM: 3.4 mmol/L — AB (ref 3.5–5.1)
Sodium: 136 mmol/L (ref 135–145)
Total Bilirubin: 25.6 mg/dL (ref 0.3–1.2)
Total Protein: 6.1 g/dL — ABNORMAL LOW (ref 6.5–8.1)

## 2015-11-13 LAB — BASIC METABOLIC PANEL
Anion gap: 9 (ref 5–15)
BUN: 36 mg/dL — AB (ref 6–20)
CALCIUM: 8.7 mg/dL — AB (ref 8.9–10.3)
CHLORIDE: 104 mmol/L (ref 101–111)
CO2: 25 mmol/L (ref 22–32)
CREATININE: UNDETERMINED mg/dL (ref 0.44–1.00)
Glucose, Bld: 88 mg/dL (ref 65–99)
Potassium: 3.5 mmol/L (ref 3.5–5.1)
Sodium: 138 mmol/L (ref 135–145)

## 2015-11-13 LAB — CBC
HCT: 27.6 % — ABNORMAL LOW (ref 35.0–47.0)
Hemoglobin: 9.1 g/dL — ABNORMAL LOW (ref 12.0–16.0)
MCH: 31.3 pg (ref 26.0–34.0)
MCHC: 32.9 g/dL (ref 32.0–36.0)
MCV: 95 fL (ref 80.0–100.0)
PLATELETS: 92 10*3/uL — AB (ref 150–440)
RBC: 2.91 MIL/uL — AB (ref 3.80–5.20)
RDW: 28.1 % — AB (ref 11.5–14.5)
WBC: 18.4 10*3/uL — AB (ref 3.6–11.0)

## 2015-11-13 LAB — CULTURE, BLOOD (ROUTINE X 2)
CULTURE: NO GROWTH
Culture: NO GROWTH

## 2015-11-13 MED ORDER — LACTULOSE 10 GM/15ML PO SOLN
10.0000 g | Freq: Two times a day (BID) | ORAL | Status: DC
  Administered 2015-11-13 – 2015-11-20 (×14): 10 g via ORAL
  Filled 2015-11-13 (×14): qty 30

## 2015-11-13 NOTE — Care Management (Addendum)
Discussed case with Raquel Sarna, Education officer, museum at [employer] [place] [facility] [place].  Will speak with patient after palliative reviews case. Anticipate that patient will need either Home Health or Home with hospice.

## 2015-11-13 NOTE — Progress Notes (Addendum)
[place] [facility] [place] NAME: Abigail Cook    MR#:  [id number]  DATE OF BIRTH:  [birth year]  SUBJECTIVE: Patient says she feels okay today no abdominal pain, no nausea.  CHIEF COMPLAINT:   Chief Complaint  Patient presents with  . Abdominal Pain  . Chest Pain  . Leg Pain   Denies any abdominal pain, denies nausea vomiting or diarrhea    . Review of Systems  Constitutional: Positive for malaise/fatigue. Negative for fever, chills and weight loss.  HENT: Negative for congestion.   Eyes: Negative for blurred vision and double vision.  Respiratory: Negative for cough, sputum production, shortness of breath and wheezing.   Cardiovascular: Negative for chest pain, palpitations, orthopnea, leg swelling and PND.  Gastrointestinal: Denies melena. Negative for nausea, vomiting, abdominal pain, diarrhea, constipation and blood in stool.  Genitourinary: Negative for dysuria, urgency, frequency and hematuria.  Musculoskeletal: Positive for myalgias and back pain. Negative for falls.  Neurological: Negative for dizziness, tremors, focal weakness and headaches.  Endo/Heme/Allergies: Does not bruise/bleed easily.  Psychiatric/Behavioral: Negative for depression. The patient does not have insomnia.     VITAL SIGNS: Blood pressure 115/64, pulse 81, temperature 98 F (36.7 C), temperature source Oral, resp. rate 20, height 5\' 2"  (1.575 m), weight 97.07 kg (214 lb), SpO2 99 %.  PHYSICAL EXAMINATION:   GENERAL:  49 y.o.-year-old patient lying in the bed no distress chronically ill-appearing Dry oral mucosa EYES: Pupils equal, round, reactive to light and accommodation. Positive scleral icterus. Extraocular muscles intact.  HEENT: Head atraumatic, normocephalic. Oropharynx and nasopharynx clear.  NECK:  Supple, no jugular venous distention. No thyroid enlargement, no tenderness.  LUNGS: Some diminished breath sounds bilaterally, no wheezing,  rales,rhonchi or crepitation. No use of accessory muscles of respiration.  CARDIOVASCULAR: S1, S2 normal. No murmurs, rubs, or gallops.  ABDOMEN: Soft , , nondistended. Bowel sounds present. No organomegaly or mass.  EXTREMITIES: No pedal edema, cyanosis, or clubbing. Some CVA tenderness on the left on percussion NEUROLOGIC: Cranial nerves II through XII are intact. Muscle strength 5/5 in all extremities. Sensation intact. Gait not checked.  PSYCHIATRIC: The patient is alert and oriented x 3.  SKIN: No obvious rash, lesion, or ulcer.   ORDERS/RESULTS REVIEWED:   CBC  Recent Labs Lab 11/07/15 0450  11/08/15 0040 11/08/15 0535 11/10/15 0513 11/12/15 0512 11/13/15 0616  WBC 11.0  --   --  10.1 15.3* 16.2* 18.4*  HGB 7.1*  < > 9.1* 9.3* 9.9* 9.3* 9.1*  HCT 21.6*  < > 27.4* 27.7* 30.9* 29.0* 27.6*  PLT 156  --   --  146* 139* 89* 92*  MCV 97.6  --   --  92.9 95.9 96.9 95.0  MCH 32.2  --   --  31.0 30.6 31.1 31.3  MCHC 33.0  --   --  33.4 31.9* 32.1 32.9  RDW 27.8*  --   --  27.5* 26.9* 27.2* 28.1*  < > = values in this interval not displayed. ------------------------------------------------------------------------------------------------------------------  Chemistries   Recent Labs Lab 11/06/15 1258 11/06/15 2207 11/07/15 0450 11/08/15 0040 11/09/15 0754 11/10/15 0513 11/11/15 0906 11/12/15 0512 11/13/15 0616  NA 130* 130* 132* 137 140 141 140 140 138  K 3.1* 2.8* 2.9* 3.0* 4.1 4.5 4.9 4.2 3.5  CL 92* 96* 101 109 115* 119* 116* 112* 104  CO2 14* 20* 19* 19* 17* 15* 17* 22 25  GLUCOSE 117* 109* 88 152* 121* 116* 109* 87 88  BUN 39* 39* 38* 39* 39* 43* 41* 39* 36*  CREATININE UNABLE TO REPORT DUE TO ICTERIC INTERFERENCE UNABLE TO REPORT DUE TO ICTERUS  5.95* UNABLE TO REPORT DUE TO ICTERUS UNABLE TO REPORT DUE TO ICTERUS SEE COMMENTS ICTERUS AT THIS LEVEL MAY AFFECT RESULT UNABLE TO REPORT DUE TO SEVERE ICTERUS UNABLE TO REPORT DUE TO ICTERUS INTERFERENCE  CALCIUM 8.0*  7.3* 7.1* 7.7* 9.0 9.2 9.2 9.1 8.7*  MG  --  1.3* 1.4*  --   --   --   --   --   --   AST 130*  --  101* 94*  --   --  92*  --   --   ALT 34  --  29 30  --   --  51  --   --   ALKPHOS 71  --  55 51  --   --  53  --   --   BILITOT 27.1*  --  23.2* 25.5*  --   --  26.7*  --   --    ------------------------------------------------------------------------------------------------------------------ CrCl cannot be calculated (Patient has no serum creatinine result on file.). ------------------------------------------------------------------------------------------------------------------ No results for input(s): TSH, T4TOTAL, T3FREE, THYROIDAB in the last 72 hours.  Invalid input(s): FREET3  Cardiac Enzymes  Recent Labs Lab 11/06/15 1258  TROPONINI 0.04*   ------------------------------------------------------------------------------------------------------------------ Invalid input(s): POCBNP ---------------------------------------------------------------------------------------------------------------  RADIOLOGY: No results found.  EKG:  Orders placed or performed during the hospital encounter of 11/06/15  . EKG 12-Lead  . EKG 12-Lead  . ED EKG within 10 minutes  . ED EKG within 10 minutes    ASSESSMENT AND PLAN:  1. Acute renal failure,  . Unable to accurately measure patient's renal function due to her bilirubin being high. Continue to monitor.Discontinued   IV bicarbonate drip secondary to a improved metabolic acidosis,  2. Right upper quadrant abdominal pain, due to alcoholic hepatitis versus alcoholic gastritis, continue Protonix twice daily    3. Hepatic encephalopathy; improved, he she is alert and oriented. Check the stool for C. difficile secondary to diarrhea. Decrease the lactulose dose. 4. Gastrointestinal  bleed, 'unable to do EGD due to cocaine being positive in urine drug screen' patient EGD colonoscopies will be scheduled as an outpatient. As per GI. 5. Acute  posthemorrhagic anemia , status post transfusion hgb stable 6. Hyponatremia, Resolved with  rehydration  7.hypokalemia resolved  8. Coagulopathy , due to liver disease , that is post vitamin K  9. Liver cirrhosis , ultrasound failed to show any masses , severe portal hypertension was noted , patient has alcoholic hepatitis, hepatitis C causing liver cirrhosis: Discrimination factor is 33, started on prednisolone 40 mg of prednisone. Discontinued the Rocephin for SBP prophylaxis. Very high 30 day mortality secondary to advanced liver cirrhosis ongoing the alcoholic abuse     DRUG ALLERGIES: No Known Allergies  CODE STATUS:     Code Status Orders        Start     Ordered   11/07/15 0025  Full code   Continuous     11/07/15 0024      Time spent 26 minutes  Jheri Mitter M.D on 11/13/2015 at 10:23 AM  Between 7am to 6pm - Pager - [phone]  After 6pm go to [email or web address] - password [email or web address] [facility]  [facility]  Office  [phone]  CC: Primary care physician; No PCP Per Patient

## 2015-11-13 NOTE — Progress Notes (Signed)
[facility]  ROUNDING NOTE   Subjective:  Pt seen at bedside. Cr can't be calculated due to icterus. BUN remains high at 36 although improved from yesterday (39) Total bilirubin 26.7 at last check Patient reports cramping in the abdomen today  Objective:  Vital signs in last 24 hours:  Temp:  [98 F (36.7 C)-98.5 F (36.9 C)] 98 F (36.7 C) (11/23 0418) Pulse Rate:  [74-81] 81 (11/23 0418) Resp:  [18-20] 20 (11/23 0418) BP: (87-115)/(36-64) 115/64 mmHg (11/23 0418) SpO2:  [97 %-99 %] 99 % (11/23 0418)  Weight change:  Filed Weights   11/06/15 1250  Weight: 97.07 kg (214 lb)    Intake/Output: I/O last 3 completed shifts: In: 2500 [P.O.:540; I.V.:1960] Out: 19 [Urine:50]   Intake/Output this shift:     Physical Exam: General: NAD  Head: Normocephalic, atraumatic. Moist oral mucosal membranes  Eyes: Icterus noted  Neck: Supple, trachea midline  Lungs:  Clear to auscultation normal effort  Heart: Regular rate and rhythm  Abdomen:  Soft, nontender, BS present  Extremities:  no peripheral edema.  Neurologic: Nonfocal, moving all four extremities  Skin: No lesions       Basic Metabolic Panel:  Recent Labs Lab 11/06/15 2207 11/07/15 0450  11/10/15 0513 11/11/15 0906 11/12/15 0512 11/13/15 0616 11/13/15 1039  NA 130* 132*  < > 141 140 140 138 136  K 2.8* 2.9*  < > 4.5 4.9 4.2 3.5 3.4*  CL 96* 101  < > 119* 116* 112* 104 102  CO2 20* 19*  < > 15* 17* 22 25 26   GLUCOSE 109* 88  < > 116* 109* 87 88 89  BUN 39* 38*  < > 43* 41* 39* 36* 38*  CREATININE UNABLE TO REPORT DUE TO ICTERUS  5.95*  < > SEE COMMENTS ICTERUS AT THIS LEVEL MAY AFFECT RESULT UNABLE TO REPORT DUE TO SEVERE ICTERUS UNABLE TO REPORT DUE TO ICTERUS INTERFERENCE UNABLE TO REPORT DUE TO ICTERUS  CALCIUM 7.3* 7.1*  < > 9.2 9.2 9.1 8.7* 8.5*  MG 1.3* 1.4*  --   --   --   --   --   --   < > = values in this interval not displayed.  Liver Function Tests:  Recent Labs Lab  11/06/15 1258 11/07/15 0450 11/08/15 0040 11/11/15 0906 11/13/15 1039  AST 130* 101* 94* 92* 90*  ALT 34 29 30 51 51  ALKPHOS 71 55 51 53 53  BILITOT 27.1* 23.2* 25.5* 26.7* 25.6*  PROT 9.0* 7.1 7.2 6.9 6.1*  ALBUMIN 1.9* 1.6* 1.5* 1.6* 1.5*    Recent Labs Lab 11/06/15 1258  LIPASE 25    Recent Labs Lab 11/06/15 1535 11/07/15 0117 11/07/15 0450  AMMONIA 126* UNABLE TO CALCULATE. 59*    CBC:  Recent Labs Lab 11/07/15 0450  11/08/15 0040 11/08/15 0535 11/10/15 0513 11/12/15 0512 11/13/15 0616  WBC 11.0  --   --  10.1 15.3* 16.2* 18.4*  HGB 7.1*  < > 9.1* 9.3* 9.9* 9.3* 9.1*  HCT 21.6*  < > 27.4* 27.7* 30.9* 29.0* 27.6*  MCV 97.6  --   --  92.9 95.9 96.9 95.0  PLT 156  --   --  146* 139* 89* 92*  < > = values in this interval not displayed.  Cardiac Enzymes:  Recent Labs Lab 11/06/15 1258  TROPONINI 0.04*    BNP: Invalid input(s): POCBNP  CBG: No results for input(s): GLUCAP in the last 168 hours.  Microbiology: Results  for orders placed or performed during the hospital encounter of 11/06/15  Culture, blood (routine x 2)     Status: None   Collection Time: 11/06/15  9:08 PM  Result Value Ref Range Status   Specimen Description BLOOD RIGHT HAND  Final   Special Requests BOTTLES DRAWN AEROBIC AND ANAEROBIC 5ML  Final   Culture NO GROWTH 7 DAYS  Final   Report Status 11/13/2015 FINAL  Final  Culture, blood (routine x 2)     Status: None   Collection Time: 11/06/15  9:09 PM  Result Value Ref Range Status   Specimen Description BLOOD RIGHT ANTECUBITAL  Final   Special Requests   Final    BOTTLES DRAWN AEROBIC AND ANAEROBIC 10MLANAEROBIC, 14MLAEROBIC   Culture NO GROWTH 7 DAYS  Final   Report Status 11/13/2015 FINAL  Final  Urine culture     Status: None   Collection Time: 11/07/15  1:30 AM  Result Value Ref Range Status   Specimen Description URINE, CATHETERIZED  Final   Special Requests NONE  Final   Culture INSIGNIFICANT GROWTH  Final    Report Status 11/08/2015 FINAL  Final  C difficile quick scan w PCR reflex     Status: None   Collection Time: 11/12/15  6:00 PM  Result Value Ref Range Status   C Diff antigen NEGATIVE NEGATIVE Final   C Diff toxin NEGATIVE NEGATIVE Final   C Diff interpretation Negative for C. difficile  Final    Coagulation Studies: No results for input(s): LABPROT, INR in the last 72 hours.  Urinalysis: No results for input(s): COLORURINE, LABSPEC, PHURINE, GLUCOSEU, HGBUR, BILIRUBINUR, KETONESUR, PROTEINUR, UROBILINOGEN, NITRITE, LEUKOCYTESUR in the last 72 hours.  Invalid input(s): APPERANCEUR    Imaging: No results found.   Medications:     . folic acid  1 mg Oral Daily  . lactulose  10 g Oral BID  . multivitamin with minerals  1 tablet Oral Daily  . pantoprazole (PROTONIX) IV  40 mg Intravenous Q12H  . prednisoLONE  40 mg Oral QAC breakfast  . sodium chloride  3 mL Intravenous Q12H  . thiamine  100 mg Oral Daily   ondansetron **OR** ondansetron (ZOFRAN) IV  Assessment/ Plan:  49 y.o. female with a PMHx of hypertension, seizure disorder,alcohol abuse, substance abuse (cocaine) who was admitted to [facility] on 11/06/2015 for evaluation of abdominal pain, malaise, and body aches.  1. Acute renal failure. 2. Hyponatremia. 3. Hypokalemia. - improved 4. Liver cirrhosis. 5. Hyperbilirubinemia. 6. Anemia unspecified with melena.  Plan: Creatinine can't be calculated due to hyperbilirubinemia and icterus.  BUN appears to be improving slowly   UOP not accurately measured Pt counselled on ETOH cessation this admission Will continue to monitor renal parameters as we are able.   LOS: 7 Srija Southard 11/23/201612:29 PM

## 2015-11-13 NOTE — Progress Notes (Signed)
GI Inpatient Follow-up Note  Patient Identification: Abigail Cook is a 49 y.o. female  Subjective: Abdominal pain continues to improve daily, states its better than yesterday. No nausea/vomiting. Tolerating regular diet. She did have an episode of rectal bleeding early this AM followed by normal brown BM that I observed during exam. Stool frequency seems to have improved w/ decreasing lactulose.  Scheduled Inpatient Medications:  . folic acid  1 mg Oral Daily  . lactulose  10 g Oral BID  . multivitamin with minerals  1 tablet Oral Daily  . pantoprazole (PROTONIX) IV  40 mg Intravenous Q12H  . prednisoLONE  40 mg Oral QAC breakfast  . sodium chloride  3 mL Intravenous Q12H  . thiamine  100 mg Oral Daily    Continuous Inpatient Infusions:     PRN Inpatient Medications:  ondansetron **OR** ondansetron (ZOFRAN) IV  Review of Systems: Constitutional: Weight is stable.  Eyes: No changes in vision. ENT: No oral lesions, sore throat.  GI: see HPI.  Heme/Lymph: No easy bruising.  CV: No chest pain.  GU: No hematuria.  Integumentary: No rashes.  Neuro: No headaches.  Psych: No depression/anxiety.  Endocrine: No heat/cold intolerance.  Allergic/Immunologic: No urticaria.  Resp: + cough  Musculoskeletal: No joint swelling.    Physical Examination: BP 115/64 mmHg  Pulse 81  Temp(Src) 98 F (36.7 C) (Oral)  Resp 20  Ht 5\' 2"  (1.575 m)  Wt 214 lb (97.07 kg)  BMI 39.13 kg/m2  SpO2 99% Gen: NAD, alert and oriented x 4 HEENT: PEERLA, EOMI, + scleral icterus  Neck: supple, no JVD or thyromegaly Chest: CTA bilaterally, no wheezes, crackles, or other adventitious sounds CV: RRR, no m/g/c/r Abd: soft, TTP in RUQ and epigastric area. Normal BS. No rebound/guarding  Ext: no edema, well perfused with 2+ pulses, Skin: no rash or lesions noted Lymph: no LAD  Data: Lab Results  Component Value Date   WBC 18.4* 11/13/2015   HGB 9.1* 11/13/2015   HCT 27.6* 11/13/2015   MCV  95.0 11/13/2015   PLT 92* 11/13/2015    Recent Labs Lab 11/10/15 0513 11/12/15 0512 11/13/15 0616  HGB 9.9* 9.3* 9.1*   Lab Results  Component Value Date   NA 138 11/13/2015   K 3.5 11/13/2015   CL 104 11/13/2015   CO2 25 11/13/2015   BUN 36* 11/13/2015   CREATININE UNABLE TO REPORT DUE TO ICTERUS INTERFERENCE 11/13/2015   Lab Results  Component Value Date   ALT 51 11/11/2015   AST 92* 11/11/2015   ALKPHOS 53 11/11/2015   BILITOT 26.7* 11/11/2015    Recent Labs Lab 11/07/15 0921  11/09/15 1555  APTT 44*  --   --   INR 1.96  < > 1.70  < > = values in this interval not displayed. Assessment/Plan: Ms. Zastoupil is a 49 y.o. female   Recommendations:   1. Cirrhosis/alcoholic hepatitis - 2/2 alcohol and hepatitis C. On steroids for DF 33, switched to prednisolone 40mg  yesterday day - will need 28 days then taper. Unable to calc MELD given can't calculate Cr but high risk for short term mortality as discussed by hospice earlier this week. Repeat LFTS and INR in AM.   2. Anemia - Hgb has remained stable since admission, unable to do EGD given + cocaine on repeat UDS. Consider EGD/colonoscopy as outpatient based on overall clinical course.   3. Leukocytosis - most likely related to steroids, negative C. Diff yesterday.  4. HE - lactulose decreased from  QID to BID yesterday, diarrhea seems improved. Mental status improved.   5. Rectal bleeding - one episode followed by normal brown stool. Most likely anal outlet aggravated by diarrhea. Morning Hgb stable.   Case discussed w/ Dr. Tiffany Kocher. Please call with questions or concerns.  Ronney Asters, PA-C [place]

## 2015-11-14 ENCOUNTER — Inpatient Hospital Stay: Payer: Medicaid Other

## 2015-11-14 LAB — HEPATIC FUNCTION PANEL
ALK PHOS: 56 U/L (ref 38–126)
ALT: 58 U/L — AB (ref 14–54)
AST: 107 U/L — ABNORMAL HIGH (ref 15–41)
Albumin: 1.7 g/dL — ABNORMAL LOW (ref 3.5–5.0)
BILIRUBIN DIRECT: 15.8 mg/dL — AB (ref 0.1–0.5)
BILIRUBIN INDIRECT: 10.9 mg/dL — AB (ref 0.3–0.9)
BILIRUBIN TOTAL: 26.7 mg/dL — AB (ref 0.3–1.2)
TOTAL PROTEIN: 6.5 g/dL (ref 6.5–8.1)

## 2015-11-14 LAB — CBC
HCT: 28 % — ABNORMAL LOW (ref 35.0–47.0)
HEMATOCRIT: 29.9 % — AB (ref 35.0–47.0)
HEMOGLOBIN: 9.9 g/dL — AB (ref 12.0–16.0)
Hemoglobin: 9.2 g/dL — ABNORMAL LOW (ref 12.0–16.0)
MCH: 31.2 pg (ref 26.0–34.0)
MCH: 31.6 pg (ref 26.0–34.0)
MCHC: 33 g/dL (ref 32.0–36.0)
MCHC: 32.8 g/dL (ref 32.0–36.0)
MCV: 94.6 fL (ref 80.0–100.0)
MCV: 96.4 fL (ref 80.0–100.0)
PLATELETS: 105 10*3/uL — AB (ref 150–440)
Platelets: 111 10*3/uL — ABNORMAL LOW (ref 150–440)
RBC: 3.16 MIL/uL — AB (ref 3.80–5.20)
RBC: 2.9 MIL/uL — AB (ref 3.80–5.20)
RDW: 28 % — ABNORMAL HIGH (ref 11.5–14.5)
RDW: 28.7 % — ABNORMAL HIGH (ref 11.5–14.5)
WBC: 19 10*3/uL — ABNORMAL HIGH (ref 3.6–11.0)
WBC: 16.2 10*3/uL — ABNORMAL HIGH (ref 3.6–11.0)

## 2015-11-14 LAB — PROTIME-INR
INR: 1.51
Prothrombin Time: 18.3 seconds — ABNORMAL HIGH (ref 11.4–15.0)

## 2015-11-14 MED ORDER — MORPHINE SULFATE (PF) 2 MG/ML IV SOLN
1.0000 mg | Freq: Once | INTRAVENOUS | Status: AC
  Administered 2015-11-14: 1 mg via INTRAVENOUS
  Filled 2015-11-14: qty 1

## 2015-11-14 MED ORDER — SODIUM CHLORIDE 0.9 % IV SOLN
25.0000 ug/h | INTRAVENOUS | Status: DC
  Administered 2015-11-14 – 2015-11-17 (×6): 50 ug/h via INTRAVENOUS
  Administered 2015-11-17: 25 ug/h via INTRAVENOUS
  Administered 2015-11-18 (×2): 50 ug/h via INTRAVENOUS
  Administered 2015-11-18: 25 ug/h via INTRAVENOUS
  Filled 2015-11-14 (×20): qty 1

## 2015-11-14 MED ORDER — NADOLOL 20 MG PO TABS
20.0000 mg | ORAL_TABLET | Freq: Every day | ORAL | Status: DC
  Administered 2015-11-15 – 2015-11-20 (×6): 20 mg via ORAL
  Filled 2015-11-14 (×7): qty 1

## 2015-11-14 MED ORDER — MORPHINE SULFATE (PF) 2 MG/ML IV SOLN
1.0000 mg | Freq: Four times a day (QID) | INTRAVENOUS | Status: DC | PRN
  Administered 2015-11-14 – 2015-11-15 (×3): 1 mg via INTRAVENOUS
  Filled 2015-11-14 (×3): qty 1

## 2015-11-14 NOTE — Consult Note (Signed)
Subjective: Patient seen for abnormal liver enzymes and abdominal pain. Patient denies any nausea or vomiting. She is tolerating regular diet. She denies any abdominal pain currently states that this is much better. She has had some loose brown colored stools today no evidence of ongoing GI bleed.  Objective: Vital signs in last 24 hours: Temp:  [98.5 F (36.9 C)-98.6 F (37 C)] 98.6 F (37 C) (11/24 1351) Pulse Rate:  [66-80] 80 (11/24 1351) Resp:  [17-20] 18 (11/24 1351) BP: (97-130)/(40-82) 130/82 mmHg (11/24 1351) SpO2:  [95 %-100 %] 95 % (11/24 1351) Blood pressure 130/82, pulse 80, temperature 98.6 F (37 C), temperature source Oral, resp. rate 18, height 5\' 2"  (1.575 m), weight 97.07 kg (214 lb), SpO2 95 %.   Intake/Output from previous day: 11/23 0701 - 11/24 0700 In: 240 [P.O.:240] Out: 0   Intake/Output this shift:     General appearance:  A 49 year old female no acute distress Resp:  Clear to auscultation Cardio:  Regular rate and rhythm GI:  Soft nontender nondistended bowel sounds positive normoactive, obese. Extremities:  No clubbing cyanosis or edema   Lab Results: Results for orders placed or performed during the hospital encounter of 11/06/15 (from the past 24 hour(s))  CBC     Status: Abnormal   Collection Time: 11/14/15  4:54 AM  Result Value Ref Range   WBC 19.0 (H) 3.6 - 11.0 K/uL   RBC 3.16 (L) 3.80 - 5.20 MIL/uL   Hemoglobin 9.9 (L) 12.0 - 16.0 g/dL   HCT 29.9 (L) 35.0 - 47.0 %   MCV 94.6 80.0 - 100.0 fL   MCH 31.2 26.0 - 34.0 pg   MCHC 33.0 32.0 - 36.0 g/dL   RDW 28.0 (H) 11.5 - 14.5 %   Platelets 111 (L) 150 - 440 K/uL  Hepatic function panel     Status: Abnormal   Collection Time: 11/14/15  4:54 AM  Result Value Ref Range   Total Protein 6.5 6.5 - 8.1 g/dL   Albumin 1.7 (L) 3.5 - 5.0 g/dL   AST 107 (H) 15 - 41 U/L   ALT 58 (H) 14 - 54 U/L   Alkaline Phosphatase 56 38 - 126 U/L   Total Bilirubin 26.7 (HH) 0.3 - 1.2 mg/dL   Bilirubin,  Direct 15.8 (H) 0.1 - 0.5 mg/dL   Indirect Bilirubin 10.9 (H) 0.3 - 0.9 mg/dL  Protime-INR     Status: Abnormal   Collection Time: 11/14/15  4:54 AM  Result Value Ref Range   Prothrombin Time 18.3 (H) 11.4 - 15.0 seconds   INR 1.51   CBC     Status: Abnormal   Collection Time: 11/14/15 12:35 PM  Result Value Ref Range   WBC 16.2 (H) 3.6 - 11.0 K/uL   RBC 2.90 (L) 3.80 - 5.20 MIL/uL   Hemoglobin 9.2 (L) 12.0 - 16.0 g/dL   HCT 28.0 (L) 35.0 - 47.0 %   MCV 96.4 80.0 - 100.0 fL   MCH 31.6 26.0 - 34.0 pg   MCHC 32.8 32.0 - 36.0 g/dL   RDW 28.7 (H) 11.5 - 14.5 %   Platelets 105 (L) 150 - 440 K/uL      Recent Labs  11/13/15 0616 11/14/15 0454 11/14/15 1235  WBC 18.4* 19.0* 16.2*  HGB 9.1* 9.9* 9.2*  HCT 27.6* 29.9* 28.0*  PLT 92* 111* 105*   BMET  Recent Labs  11/12/15 0512 11/13/15 0616 11/13/15 1039  NA 140 138 136  K 4.2 3.5 3.4*  CL 112* 104 102  CO2 22 25 26   GLUCOSE 87 88 89  BUN 39* 36* 38*  CREATININE UNABLE TO REPORT DUE TO SEVERE ICTERUS UNABLE TO REPORT DUE TO ICTERUS INTERFERENCE UNABLE TO REPORT DUE TO ICTERUS  CALCIUM 9.1 8.7* 8.5*   LFT  Recent Labs  11/14/15 0454  PROT 6.5  ALBUMIN 1.7*  AST 107*  ALT 58*  ALKPHOS 56  BILITOT 26.7*  BILIDIR 15.8*  IBILI 10.9*   PT/INR  Recent Labs  11/14/15 0454  LABPROT 18.3*  INR 1.51   Hepatitis Panel No results for input(s): HEPBSAG, HCVAB, HEPAIGM, HEPBIGM in the last 72 hours. C-Diff  Recent Labs  11/12/15 1800  CDIFFTOX NEGATIVE   No results for input(s): CDIFFPCR in the last 72 hours.   Studies/Results: Dg Chest Port 1 View  11/14/2015  CLINICAL DATA:  Cough and chest pain EXAM: PORTABLE CHEST - 1 VIEW COMPARISON:  11/06/2015 FINDINGS: Cardiac shadow is within normal limits. The lungs are well aerated bilaterally. Mild peribronchial cuffing is noted which may represent some bronchitis. No focal infiltrate or sizable effusion is seen. Postsurgical changes in the left humerus are  noted. IMPRESSION: Changes consistent with mild bronchitis. No focal infiltrate is seen. Electronically Signed   By: Inez Catalina M.D.   On: 11/14/2015 10:33    Scheduled Inpatient Medications:   . folic acid  1 mg Oral Daily  . lactulose  10 g Oral BID  . multivitamin with minerals  1 tablet Oral Daily  . nadolol  20 mg Oral Daily  . pantoprazole (PROTONIX) IV  40 mg Intravenous Q12H  . prednisoLONE  40 mg Oral QAC breakfast  . sodium chloride  3 mL Intravenous Q12H  . thiamine  100 mg Oral Daily    Continuous Inpatient Infusions:   . octreotide  (SANDOSTATIN)    IV infusion 50 mcg/hr (11/14/15 1158)    PRN Inpatient Medications:  morphine injection, ondansetron **OR** ondansetron (ZOFRAN) IV  Miscellaneous:   Assessment:  1. Alcohol and cocaine associated hepatopathy. Currently on 40 mg of prednisolone daily 2. Possible upper GI bleed in the setting of NSAID use. Patient currently continuing on octreotide and IV PPI. 3. Severe portal hypertension noted on abdominal ultrasound with severe cirrhotic changes and fatty infiltration of the liver. There is no asterixis currently. patient continuing on lactulose, folic acid, nadolol, octreotide. It is of note that her prothrombin time is slowly improving. Clearance of her bilirubin take longer. 4. History of substance abuse and alcohol use  Plan:  1. Continue current. Would discontinue the octreotide after 5 days from the initial start. 2. EGD when clinically feasible os bleeding early in the week if she is negative for cocaine and has not been discharged. Otherwise will need follow-up with GI.  Lollie Sails MD 11/14/2015, 5:24 PM

## 2015-11-14 NOTE — Progress Notes (Signed)
[facility]  ROUNDING NOTE   Subjective:  Creatinine still can be calculated. BUN still remains elevated at 38. Serum bicarbonate 26 at present. Patient reports abdominal pain when eating.  Objective:  Vital signs in last 24 hours:  Temp:  [98.2 F (36.8 C)-98.6 F (37 C)] 98.6 F (37 C) (11/24 0541) Pulse Rate:  [66-71] 67 (11/24 0541) Resp:  [17-20] 17 (11/24 0541) BP: (117-121)/(64-69) 121/68 mmHg (11/24 0541) SpO2:  [98 %-100 %] 100 % (11/24 0541)  Weight change:  Filed Weights   11/06/15 1250  Weight: 97.07 kg (214 lb)    Intake/Output: I/O last 3 completed shifts: In: 1669 [P.O.:240; I.V.:1429] Out: 0    Intake/Output this shift:     Physical Exam: General: NAD  Head: Normocephalic, atraumatic. Moist oral mucosal membranes  Eyes: Icterus noted  Neck: Supple, trachea midline  Lungs:  Clear to auscultation normal effort  Heart: Regular rate and rhythm  Abdomen:  Soft, nontender, BS present  Extremities:  no peripheral edema.  Neurologic: Nonfocal, moving all four extremities  Skin: No lesions       Basic Metabolic Panel:  Recent Labs Lab 11/10/15 0513 11/11/15 0906 11/12/15 0512 11/13/15 0616 11/13/15 1039  NA 141 140 140 138 136  K 4.5 4.9 4.2 3.5 3.4*  CL 119* 116* 112* 104 102  CO2 15* 17* 22 25 26   GLUCOSE 116* 109* 87 88 89  BUN 43* 41* 39* 36* 38*  CREATININE SEE COMMENTS ICTERUS AT THIS LEVEL MAY AFFECT RESULT UNABLE TO REPORT DUE TO SEVERE ICTERUS UNABLE TO REPORT DUE TO ICTERUS INTERFERENCE UNABLE TO REPORT DUE TO ICTERUS  CALCIUM 9.2 9.2 9.1 8.7* 8.5*    Liver Function Tests:  Recent Labs Lab 11/08/15 0040 11/11/15 0906 11/13/15 1039 11/14/15 0454  AST 94* 92* 90* 107*  ALT 30 51 51 58*  ALKPHOS 51 53 53 56  BILITOT 25.5* 26.7* 25.6* 26.7*  PROT 7.2 6.9 6.1* 6.5  ALBUMIN 1.5* 1.6* 1.5* 1.7*   No results for input(s): LIPASE, AMYLASE in the last 168 hours. No results for input(s): AMMONIA in the last 168  hours.  CBC:  Recent Labs Lab 11/08/15 0535 11/10/15 0513 11/12/15 0512 11/13/15 0616 11/14/15 0454  WBC 10.1 15.3* 16.2* 18.4* 19.0*  HGB 9.3* 9.9* 9.3* 9.1* 9.9*  HCT 27.7* 30.9* 29.0* 27.6* 29.9*  MCV 92.9 95.9 96.9 95.0 94.6  PLT 146* 139* 89* 92* 111*    Cardiac Enzymes: No results for input(s): CKTOTAL, CKMB, CKMBINDEX, TROPONINI in the last 168 hours.  BNP: Invalid input(s): POCBNP  CBG: No results for input(s): GLUCAP in the last 168 hours.  Microbiology: Results for orders placed or performed during the hospital encounter of 11/06/15  Culture, blood (routine x 2)     Status: None   Collection Time: 11/06/15  9:08 PM  Result Value Ref Range Status   Specimen Description BLOOD RIGHT HAND  Final   Special Requests BOTTLES DRAWN AEROBIC AND ANAEROBIC 5ML  Final   Culture NO GROWTH 7 DAYS  Final   Report Status 11/13/2015 FINAL  Final  Culture, blood (routine x 2)     Status: None   Collection Time: 11/06/15  9:09 PM  Result Value Ref Range Status   Specimen Description BLOOD RIGHT ANTECUBITAL  Final   Special Requests   Final    BOTTLES DRAWN AEROBIC AND ANAEROBIC 10MLANAEROBIC, 14MLAEROBIC   Culture NO GROWTH 7 DAYS  Final   Report Status 11/13/2015 FINAL  Final  Urine culture     Status: None   Collection Time: 11/07/15  1:30 AM  Result Value Ref Range Status   Specimen Description URINE, CATHETERIZED  Final   Special Requests NONE  Final   Culture INSIGNIFICANT GROWTH  Final   Report Status 11/08/2015 FINAL  Final  C difficile quick scan w PCR reflex     Status: None   Collection Time: 11/12/15  6:00 PM  Result Value Ref Range Status   C Diff antigen NEGATIVE NEGATIVE Final   C Diff toxin NEGATIVE NEGATIVE Final   C Diff interpretation Negative for C. difficile  Final    Coagulation Studies:  Recent Labs  11/14/15 0454  LABPROT 18.3*  INR 1.51    Urinalysis: No results for input(s): COLORURINE, LABSPEC, PHURINE, GLUCOSEU, HGBUR,  BILIRUBINUR, KETONESUR, PROTEINUR, UROBILINOGEN, NITRITE, LEUKOCYTESUR in the last 72 hours.  Invalid input(s): APPERANCEUR    Imaging: No results found.   Medications:     . folic acid  1 mg Oral Daily  . lactulose  10 g Oral BID  . multivitamin with minerals  1 tablet Oral Daily  . pantoprazole (PROTONIX) IV  40 mg Intravenous Q12H  . prednisoLONE  40 mg Oral QAC breakfast  . sodium chloride  3 mL Intravenous Q12H  . thiamine  100 mg Oral Daily   ondansetron **OR** ondansetron (ZOFRAN) IV  Assessment/ Plan:  49 y.o. female with a PMHx of hypertension, seizure disorder,alcohol abuse, substance abuse (cocaine) who was admitted to [facility] on 11/06/2015 for evaluation of abdominal pain, malaise, and body aches.  1. Acute renal failure. 2. Hyponatremia. 3. Hypokalemia. - improved 4. Liver cirrhosis. 5. Hyperbilirubinemia. 6. Anemia unspecified with melena.  Plan: It appears that renal function has improved from admission. This is based upon decreasing BUN. Creatinine has been unable to be calculated secondary to hyperbilirubinemia and icterus. Many of her metabolic parameters have improved. Serum sodium is up to 136. Potassium slightly low yesterday at 3.4. The underlying etiology of her current condition is liver cirrhosis secondary to alcohol abuse and hepatitis C. It appears we are awaiting palliative care input.   LOS: 8 Abigail Cook 11/24/20169:26 AM

## 2015-11-14 NOTE — Progress Notes (Signed)
Palliative Medicine Inpatient Consult Follow Up Note   Name: Abigail Cook Date: 11/14/2015 MRN: [id number]  DOB: Mar 16, 1966  Referring Physician: Epifanio Lesches, MD  Palliative Care consult requested for this 49 y.o. female for goals of medical therapy in patient with alcoholic cirrhosis.   TODAY'S DISCUSSIONS AND DECISIONS:  This evening I visited patient and found that she had a visitor in the room.  Pt granted me permission to speak freely with this visitor present.  The visitor is not a relative, but knew pt in the past and has been 'looking out for her and her son ever since they moved in across the street'.  The visitor runs [employer] [facility], she says.    I was able to tell the patient that she may need Hospice.  Pt would like to stay here and get as much treatment as possible for as long as we feel it is likely to help her.    Pt told me (and the visitor/friend) that she won't go to any kind of facility.  BUT, her friend reminided pt that  she is NOT GOING TO BE ABLE TO GO UP THE 17 STEPS TO GET INTO HER APARTMENT.   Pt is not realistic on this matter.   We learned more about her son who lives with pt.  Pt says he is Paranoid Schizophrenic, on disability.  Pt says he drives and he can cook. She says he is very strong. She says he takes his medications for his mental problems.  She says he does not drink.  We did not get into the subject of cocaine tonight with her friend present.    All in all, the pt was sad that I mentioned Hospice. She wants [place] to do what we can for her here, and she wants to go home --a very unrealistic wish.  However, she did not seem to be negative about getting help from hospice --just sad about it being indicated.     Note that the patient's friend does not seem to be aware of any other relatives of the pt that are frequent visitors or involved in pts life. She may just not be aware of them. Pt had told me there was an aunt and some other relatives  who can help her.  However, these folks haven't been here as far as I know-- and it may be that pt is very alone --except for her mentally ill son and this well-wishing neighbor/ friend.       IMPRESSION: 1. End Stage Alcoholic Liver Cirrhosis ---with total bilirubin (27..1 day of admission --then lower ---back up to 26.7) ---on methylprednisolone ---with Hepatic Encephalopathy --improved however now with lactulose ---with abdominal pain possibly related to liver disease and/ or alcoholic gastritis  ---with GI bleed (EGD held today due to cocaine in system) ---with coagulopathy (treated with Vit K) ---Ultrasound did not show masses but severe portal HTN is present 2. Hyponatremia --treated with rehydration 3. Hypokalemia --treated 4. Acute on chronic renal failure ----creatinine not calculable  5. Metabolic Acidosis 6. Chest Pain 7. Acute Blood Loss Anemia from GI bleed  ---transfused 8. Hepatitis C  --- Not a candidate for treatment at this time given her degree of liver and renal failure   --------------------------------------------------------------------------------------------------------------------------  CODE STATUS: Full Code (per patient's wishes --it has been addressed with her)   PAST MEDICAL HISTORY: Past Medical History  Diagnosis Date  . Hypertension   . Seizures ([place])     PAST SURGICAL  HISTORY:  Past Surgical History  Procedure Laterality Date  . Rod placed in right leg/foot      Vital Signs: BP 130/82 mmHg  Pulse 80  Temp(Src) 98.6 F (37 C) (Oral)  Resp 18  Ht 5\' 2"  (1.575 m)  Wt 97.07 kg (214 lb)  BMI 39.13 kg/m2  SpO2 95% Filed Weights   11/06/15 1250  Weight: 97.07 kg (214 lb)    Estimated body mass index is 39.13 kg/(m^2) as calculated from the following:   Height as of this encounter: 5\' 2"  (1.575 m).   Weight as of this encounter: 97.07 kg (214 lb).  PHYSICAL EXAM:   LABS: CBC:    Component Value Date/Time   WBC  16.2* 11/14/2015 1235   WBC 8.6 07/26/2014 0432   HGB 9.2* 11/14/2015 1235   HGB 9.1* 07/26/2014 0432   HCT 28.0* 11/14/2015 1235   HCT 28.5* 07/26/2014 0432   PLT 105* 11/14/2015 1235   PLT 134* 07/26/2014 0432   MCV 96.4 11/14/2015 1235   MCV 76* 07/26/2014 0432   NEUTROABS 5.7 07/26/2014 0432   LYMPHSABS 1.6 07/26/2014 0432   MONOABS 1.1* 07/26/2014 0432   EOSABS 0.2 07/26/2014 0432   BASOSABS 0.1 07/26/2014 0432   BASOSABS 1 07/22/2014 1931   Comprehensive Metabolic Panel:    Component Value Date/Time   NA 136 11/13/2015 1039   NA 136 07/25/2014 1337   K 3.4* 11/13/2015 1039   K 3.6 07/25/2014 1337   CL 102 11/13/2015 1039   CL 103 07/25/2014 1337   CO2 26 11/13/2015 1039   CO2 26 07/25/2014 1337   BUN 38* 11/13/2015 1039   BUN 7 07/25/2014 1337   CREATININE UNABLE TO REPORT DUE TO ICTERUS 11/13/2015 1039   CREATININE 0.91 07/25/2014 1337   GLUCOSE 89 11/13/2015 1039   GLUCOSE 121* 07/25/2014 1337   CALCIUM 8.5* 11/13/2015 1039   CALCIUM 8.7 07/25/2014 1337   AST 107* 11/14/2015 0454   AST 67* 07/22/2014 1931   ALT 58* 11/14/2015 0454   ALT 38 07/22/2014 1931   ALKPHOS 56 11/14/2015 0454   ALKPHOS 60 07/22/2014 1931   BILITOT 26.7* 11/14/2015 0454   BILITOT 0.5 07/22/2014 1931   PROT 6.5 11/14/2015 0454   PROT 8.7* 07/22/2014 1931   ALBUMIN 1.7* 11/14/2015 0454   ALBUMIN 3.0* 07/22/2014 1931    Time Spent:

## 2015-11-14 NOTE — Progress Notes (Signed)
[facility] [place] NAME: Abigail Cook    MR#:  [id number]  DATE OF BIRTH:  02/07/1966  SUBJECTIVe; lot of  abdominal cramps today. Also had coughed up some blood. Serum morphine for abdominal pain.  CHIEF COMPLAINT:   Chief Complaint  Patient presents with  . Abdominal Pain  . Chest Pain  . Leg Pain      . Review of Systems  Constitutional: Positive for malaise/fatigue. Negative for fever, chills and weight loss.  HENT: Negative for congestion.   Eyes: Negative for blurred vision and double vision.  Respiratory: Negative for cough, sputum production, shortness of breath and wheezing.   Cardiovascular: Negative for chest pain, palpitations, orthopnea, leg swelling and PND.  Gastrointestinal: Abdominal cramps generalized.  had nausea. And the coughing up some blood. constipation and blood in stool.  Genitourinary: Negative for dysuria, urgency, frequency and hematuria.  Musculoskeletal: Positive for myalgias and back pain. Negative for falls.  Neurological: Negative for dizziness, tremors, focal weakness and headaches.  Endo/Heme/Allergies: Does not bruise/bleed easily.  Psychiatric/Behavioral: Negative for depression. The patient does not have insomnia.     VITAL SIGNS: Blood pressure 111/60, pulse 80, temperature 98.5 F (36.9 C), temperature source Oral, resp. rate 20, height 5\' 2"  (1.575 m), weight 97.07 kg (214 lb), SpO2 100 %.  PHYSICAL EXAMINATION:   GENERAL:  49 y.o.-year-old patient lying in the bed no distress chronically ill-appearing Dry oral mucosa EYES: Pupils equal, round, reactive to light and accommodation. Positive scleral icterus. Extraocular muscles intact.  HEENT: Head atraumatic, normocephalic. Oropharynx and nasopharynx clear.  NECK:  Supple, no jugular venous distention. No thyroid enlargement, no tenderness.  LUNGS: Some diminished breath sounds bilaterally, no wheezing, rales,rhonchi or  crepitation. No use of accessory muscles of respiration.  CARDIOVASCULAR: S1, S2 normal. No murmurs, rubs, or gallops.  ABDOMEN: Soft , , nondistended. Bowel sounds present. No organomegaly or mass.  EXTREMITIES: No pedal edema, cyanosis, or clubbing. Some CVA tenderness on the left on percussion NEUROLOGIC: Cranial nerves II through XII are intact. Muscle strength 5/5 in all extremities. Sensation intact. Gait not checked.  PSYCHIATRIC: The patient is alert and oriented x 3.  SKIN: No obvious rash, lesion, or ulcer.   ORDERS/RESULTS REVIEWED:   CBC  Recent Labs Lab 11/08/15 0535 11/10/15 0513 11/12/15 0512 11/13/15 0616 11/14/15 0454  WBC 10.1 15.3* 16.2* 18.4* 19.0*  HGB 9.3* 9.9* 9.3* 9.1* 9.9*  HCT 27.7* 30.9* 29.0* 27.6* 29.9*  PLT 146* 139* 89* 92* 111*  MCV 92.9 95.9 96.9 95.0 94.6  MCH 31.0 30.6 31.1 31.3 31.2  MCHC 33.4 31.9* 32.1 32.9 33.0  RDW 27.5* 26.9* 27.2* 28.1* 28.0*   ------------------------------------------------------------------------------------------------------------------  Chemistries   Recent Labs Lab 11/08/15 0040  11/10/15 0513 11/11/15 0906 11/12/15 0512 11/13/15 0616 11/13/15 1039 11/14/15 0454  NA 137  < > 141 140 140 138 136  --   K 3.0*  < > 4.5 4.9 4.2 3.5 3.4*  --   CL 109  < > 119* 116* 112* 104 102  --   CO2 19*  < > 15* 17* 22 25 26   --   GLUCOSE 152*  < > 116* 109* 87 88 89  --   BUN 39*  < > 43* 41* 39* 36* 38*  --   CREATININE UNABLE TO REPORT DUE TO ICTERUS  < > SEE COMMENTS ICTERUS AT THIS LEVEL MAY AFFECT RESULT UNABLE TO REPORT DUE TO SEVERE ICTERUS UNABLE TO  REPORT DUE TO ICTERUS INTERFERENCE UNABLE TO REPORT DUE TO ICTERUS  --   CALCIUM 7.7*  < > 9.2 9.2 9.1 8.7* 8.5*  --   AST 94*  --   --  92*  --   --  90* 107*  ALT 30  --   --  51  --   --  51 58*  ALKPHOS 51  --   --  53  --   --  53 56  BILITOT 25.5*  --   --  26.7*  --   --  25.6* 26.7*  < > = values in this interval not  displayed. ------------------------------------------------------------------------------------------------------------------ CrCl cannot be calculated (Patient has no serum creatinine result on file.). ------------------------------------------------------------------------------------------------------------------ No results for input(s): TSH, T4TOTAL, T3FREE, THYROIDAB in the last 72 hours.  Invalid input(s): FREET3  Cardiac Enzymes No results for input(s): CKMB, TROPONINI, MYOGLOBIN in the last 168 hours.  Invalid input(s): CK ------------------------------------------------------------------------------------------------------------------ Invalid input(s): POCBNP ---------------------------------------------------------------------------------------------------------------  RADIOLOGY: Dg Chest Port 1 View  11/14/2015  CLINICAL DATA:  Cough and chest pain EXAM: PORTABLE CHEST - 1 VIEW COMPARISON:  11/06/2015 FINDINGS: Cardiac shadow is within normal limits. The lungs are well aerated bilaterally. Mild peribronchial cuffing is noted which may represent some bronchitis. No focal infiltrate or sizable effusion is seen. Postsurgical changes in the left humerus are noted. IMPRESSION: Changes consistent with mild bronchitis. No focal infiltrate is seen. Electronically Signed   By: Inez Catalina M.D.   On: 11/14/2015 10:33    EKG:  Orders placed or performed during the hospital encounter of 11/06/15  . EKG 12-Lead  . EKG 12-Lead  . ED EKG within 10 minutes  . ED EKG within 10 minutes    ASSESSMENT AND PLAN:  1. Acute renal failure,  . Unable to accurately measure patient's renal function due to her bilirubin being high. Continue to monitor.Discontinued   IV bicarbonate drip secondary to a improved metabolic acidosis,  2. Right upper quadrant abdominal pain, due to alcoholic hepatitis versus alcoholic gastritis, continue Protonix twice daily  Coughing up blood;gi bleed/; started on  octreotide drip. She GI consult. Check stat hemoglobin. Check a stat x-ray of the chest. If patient continues to be hemodynamically unstable begun on movement to ICU. High risk for cardiac arrest started nadolol for portal hypertension.  3. Hepatic encephalopathy; improved, he she is alert and oriented. Check the stool for C. difficile secondary to diarrhea. Decrease the lactulose dose. 4. Gastrointestinal  bleed, 'unable to do EGD due to cocaine being positive in urine drug screen' patient EGD colonoscopies will be scheduled as an outpatient. As per GI. 5. Acute posthemorrhagic anemia , status post transfusion hgb stable 6. Hyponatremia, Resolved with  rehydration  7.hypokalemia resolved  8. Coagulopathy , due to liver disease , that is post vitamin K  9. Liver cirrhosis , ultrasound failed to show any masses , severe portal hypertension was noted , patient has alcoholic hepatitis, hepatitis C causing liver cirrhosis: Discrimination factor is 33, started on prednisolone 40 mg of prednisone. Discontinued the Rocephin for SBP prophylaxis. Very high 30 day mortality  secondary to advanced liver cirrhosis ongoing the alcoholic abuse     DRUG ALLERGIES: No Known Allergies  CODE STATUS:     Code Status Orders        Start     Ordered   11/07/15 0025  Full code   Continuous     11/07/15 0024      Time spent 25 minutes  Jurgen Groeneveld  M.D on 11/14/2015 at 10:37 AM  Between 7am to 6pm - Pager - [phone]  After 6pm go to [email or web address] - password [email or web address]  [facility]  Office  [phone]  CC: Primary care physician; No PCP Per Patient

## 2015-11-14 NOTE — Progress Notes (Signed)
Patient complaining of severe abdominal pain and coughing up small amount of bright red blood. Dr. Vianne Bulls notified verbally in person. 1 mg Morphine IV once and sandostatin gtt ordered. Patient anxious and BP 102/40, 97/41. Patient experienced relief from pain medication and became calm. BP improved to 111/60, sandostatin infusing, and patient resting comfortably while eating lunch. Will continue to monitor.  Almedia Balls, RN

## 2015-11-15 LAB — BASIC METABOLIC PANEL
ANION GAP: 9 (ref 5–15)
BUN: 38 mg/dL — ABNORMAL HIGH (ref 6–20)
CALCIUM: 8.2 mg/dL — AB (ref 8.9–10.3)
CO2: 24 mmol/L (ref 22–32)
Chloride: 101 mmol/L (ref 101–111)
Creatinine, Ser: UNDETERMINED mg/dL (ref 0.44–1.00)
Glucose, Bld: 129 mg/dL — ABNORMAL HIGH (ref 65–99)
POTASSIUM: 3.6 mmol/L (ref 3.5–5.1)
Sodium: 134 mmol/L — ABNORMAL LOW (ref 135–145)

## 2015-11-15 MED ORDER — MORPHINE SULFATE (PF) 2 MG/ML IV SOLN
1.0000 mg | Freq: Four times a day (QID) | INTRAVENOUS | Status: DC | PRN
  Administered 2015-11-15 – 2015-11-18 (×3): 1 mg via INTRAVENOUS
  Filled 2015-11-15 (×3): qty 1

## 2015-11-15 NOTE — Progress Notes (Signed)
Physical Therapy Treatment Patient Details Name: Abigail Cook MRN: [id number] DOB: Sep 18, 1966 Today's Date: 11/15/2015    History of Present Illness Patient presents to [facility] with R UQ pain as well as generalized body aches. Patient noted to be in ARF, hyperbilirubinemia. During this admission she has also complained of chest pain.     PT Comments    Pt is making good progress towards goals with increased independence noted this date. Pt able to tolerate increased there-ex and ambulation in hallway with sats at 99% on RA. Cues required for safety with OOB mobility. Pt motivated to participate with therapy.  Follow Up Recommendations  Home health PT;Supervision - Intermittent     Equipment Recommendations       Recommendations for Other Services       Precautions / Restrictions Precautions Precautions: Fall Restrictions Weight Bearing Restrictions: No    Mobility  Bed Mobility Overal bed mobility: Needs Assistance Bed Mobility: Supine to Sit     Supine to sit: Supervision     General bed mobility comments: bed mobility performed with safe technique using bed rail to assist. Once sitting at EOB, pt able to sit with independence  Transfers Overall transfer level: Needs assistance Equipment used: Rolling walker (2 wheeled) Transfers: Sit to/from Stand Sit to Stand: Supervision         General transfer comment: Pt requires cues to push from seated surface prior to standing. Pt slightly impulsive and needs cues to wait for therapist prior to standing  Ambulation/Gait Ambulation/Gait assistance: Min guard Ambulation Distance (Feet): 130 Feet Assistive device: Rolling walker (2 wheeled) Gait Pattern/deviations: Step-through pattern     General Gait Details: ambulated using cga with safe technique. Cues given for reciprocal gait pattern and to stay closer to rw. Pt fatigues with increased ambulation distance and needs cues to turn back towards room.   Stairs             Wheelchair Mobility    Modified Rankin (Stroke Patients Only)       Balance                                    Cognition Arousal/Alertness: Awake/alert Behavior During Therapy: Anxious;WFL for tasks assessed/performed Overall Cognitive Status: Within Functional Limits for tasks assessed                      Exercises Other Exercises Other Exercises: Supine ther-ex performed including ankle pumps, SAQ, hip add squeezes, SLRs, and glut squeezes. All ther-ex performed x 12 reps with cga and cues for correct technique.    General Comments        Pertinent Vitals/Pain Pain Assessment: No/denies pain    Home Living                      Prior Function            PT Goals (current goals can now be found in the care plan section) Acute Rehab PT Goals Patient Stated Goal: To return home  PT Goal Formulation: With patient Time For Goal Achievement: 11/25/15 Potential to Achieve Goals: Good Progress towards PT goals: Progressing toward goals    Frequency  Min 2X/week    PT Plan Discharge plan needs to be updated    Co-evaluation             End of Session Equipment Utilized During  Treatment: Gait belt Activity Tolerance: Patient limited by fatigue Patient left: in bed;with bed alarm set     Time: [id number] PT Time Calculation (min) (ACUTE ONLY): 23 min  Charges:  $Gait Training: 8-22 mins $Therapeutic Exercise: 8-22 mins                    G Codes:      Teagan Heidrick 2015/12/13, 9:37 AM  Greggory Stallion, PT, DPT [phone]

## 2015-11-15 NOTE — Progress Notes (Signed)
[facility] NAME: Abigail Cook    MR#:  [id number]  DATE OF BIRTH:  04/12/1966  SUBJECTIVe; less abdominal pain. Less cough. No more coughing of blood..  CHIEF COMPLAINT:   Chief Complaint  Patient presents with  . Abdominal Pain  . Chest Pain  . Leg Pain      . Review of Systems  Constitutional: Positive for malaise/fatigue. Negative for fever, chills and weight loss.  HENT: Negative for congestion.   Eyes: Negative for blurred vision and double vision.  Respiratory: Negative for cough, sputum production, shortness of breath and wheezing.   Cardiovascular: Negative for chest pain, palpitations, orthopnea, leg swelling and PND.  Gastrointestinal: Abdominal cramps generalized.  had nausea. And the coughing up some blood. constipation and blood in stool.  Genitourinary: Negative for dysuria, urgency, frequency and hematuria.  Musculoskeletal: Positive for myalgias and back pain. Negative for falls.  Neurological: Negative for dizziness, tremors, focal weakness and headaches.  Endo/Heme/Allergies: Does not bruise/bleed easily.  Psychiatric/Behavioral: Negative for depression. The patient does not have insomnia.     VITAL SIGNS: Blood pressure 131/81, pulse 73, temperature 98.3 F (36.8 C), temperature source Oral, resp. rate 20, height 5\' 2"  (1.575 m), weight 97.07 kg (214 lb), SpO2 100 %.  PHYSICAL EXAMINATION:   GENERAL:  49 y.o.-year-old patient lying in the bed no distress chronically ill-appearing Dry oral mucosa EYES: Pupils equal, round, reactive to light and accommodation. Positive scleral icterus. Extraocular muscles intact.  HEENT: Head atraumatic, normocephalic. Oropharynx and nasopharynx clear.  NECK:  Supple, no jugular venous distention. No thyroid enlargement, no tenderness.  LUNGS: Some diminished breath sounds bilaterally, no wheezing, rales,rhonchi or crepitation. No use of accessory muscles of  respiration.  CARDIOVASCULAR: S1, S2 normal. No murmurs, rubs, or gallops.  ABDOMEN: Soft , , nondistended. Bowel sounds present. No organomegaly or mass.  EXTREMITIES: No pedal edema, cyanosis, or clubbing. Some CVA tenderness on the left on percussion NEUROLOGIC: Cranial nerves II through XII are intact. Muscle strength 5/5 in all extremities. Sensation intact. Gait not checked.  PSYCHIATRIC: The patient is alert and oriented x 3.  SKIN: No obvious rash, lesion, or ulcer.   ORDERS/RESULTS REVIEWED:   CBC  Recent Labs Lab 11/10/15 0513 11/12/15 0512 11/13/15 0616 11/14/15 0454 11/14/15 1235  WBC 15.3* 16.2* 18.4* 19.0* 16.2*  HGB 9.9* 9.3* 9.1* 9.9* 9.2*  HCT 30.9* 29.0* 27.6* 29.9* 28.0*  PLT 139* 89* 92* 111* 105*  MCV 95.9 96.9 95.0 94.6 96.4  MCH 30.6 31.1 31.3 31.2 31.6  MCHC 31.9* 32.1 32.9 33.0 32.8  RDW 26.9* 27.2* 28.1* 28.0* 28.7*   ------------------------------------------------------------------------------------------------------------------  Chemistries   Recent Labs Lab 11/11/15 0906 11/12/15 0512 11/13/15 0616 11/13/15 1039 11/14/15 0454 11/15/15 0826  NA 140 140 138 136  --  134*  K 4.9 4.2 3.5 3.4*  --  3.6  CL 116* 112* 104 102  --  101  CO2 17* 22 25 26   --  24  GLUCOSE 109* 87 88 89  --  129*  BUN 41* 39* 36* 38*  --  38*  CREATININE ICTERUS AT THIS LEVEL MAY AFFECT RESULT UNABLE TO REPORT DUE TO SEVERE ICTERUS UNABLE TO REPORT DUE TO ICTERUS INTERFERENCE UNABLE TO REPORT DUE TO ICTERUS  --  UNABLE TO REPORT DUE TO ICTERUS  CALCIUM 9.2 9.1 8.7* 8.5*  --  8.2*  AST 92*  --   --  90* 107*  --  ALT 51  --   --  51 58*  --   ALKPHOS 53  --   --  53 56  --   BILITOT 26.7*  --   --  25.6* 26.7*  --    ------------------------------------------------------------------------------------------------------------------ CrCl cannot be calculated (Patient has no serum creatinine result on  file.). ------------------------------------------------------------------------------------------------------------------ No results for input(s): TSH, T4TOTAL, T3FREE, THYROIDAB in the last 72 hours.  Invalid input(s): FREET3  Cardiac Enzymes No results for input(s): CKMB, TROPONINI, MYOGLOBIN in the last 168 hours.  Invalid input(s): CK ------------------------------------------------------------------------------------------------------------------ Invalid input(s): POCBNP ---------------------------------------------------------------------------------------------------------------  RADIOLOGY: Dg Chest Port 1 View  11/14/2015  CLINICAL DATA:  Cough and chest pain EXAM: PORTABLE CHEST - 1 VIEW COMPARISON:  11/06/2015 FINDINGS: Cardiac shadow is within normal limits. The lungs are well aerated bilaterally. Mild peribronchial cuffing is noted which may represent some bronchitis. No focal infiltrate or sizable effusion is seen. Postsurgical changes in the left humerus are noted. IMPRESSION: Changes consistent with mild bronchitis. No focal infiltrate is seen. Electronically Signed   By: Inez Catalina M.D.   On: 11/14/2015 10:33    EKG:  Orders placed or performed during the hospital encounter of 11/06/15  . EKG 12-Lead  . EKG 12-Lead  . ED EKG within 10 minutes  . ED EKG within 10 minutes    ASSESSMENT AND PLAN:  1. Acute renal failure,  . Unable to accurately measure patient's renal function due to her bilirubin being high. Continue to monitor.Discontinued   IV bicarbonate drip secondary to a improved metabolic acidosis,  2. Right upper quadrant abdominal pain, due to alcoholic hepatitis versus alcoholic gastritis, continue Protonix twice daily  Coughing up blood;gi bleed/; started on octreotide drip continue for 5 days as per GI.Marland Kitchen  Hemoglobin is stable.   3. Hepatic encephalopathy; improved, he she is alert and oriented. Check the stool for C. difficile secondary to diarrhea.  Decrease the lactulose dose  . 4. Gastrointestinal  bleed, 'unable to do EGD due to cocaine being positive in urine drug screen' patient EGD colonoscopies will be scheduled as an outpatient. As per GI.   5. Acute posthemorrhagic anemia , status post transfusion hgb stable 6. Hyponatremia, Resolved with  rehydration  7.hypokalemia resolved  8. Coagulopathy , due to liver disease , that is post vitamin K  9. Liver cirrhosis , ultrasound failed to show any masses , severe portal hypertension was noted , patient has alcoholic hepatitis, hepatitis C causing liver cirrhosis: Discrimination factor is 33, started on prednisolone 40 mg of prednisone. Discontinued the Rocephin for SBP prophylaxis. Very high 30 day mortality   secondary to advanced liver cirrhosis ongoing the alcoholic abuse   [id number]. Plan continue octreotide drip for 3 more days after that if she is stable discharge home with home health.  DRUG ALLERGIES: No Known Allergies  CODE STATUS:     Code Status Orders        Start     Ordered   11/07/15 0025  Full code   Continuous     11/07/15 0024      Time spent 56 minutes  Sanaii Caporaso M.D on 11/15/2015 at 12:14 PM  Between 7am to 6pm - Pager - [phone]  After 6pm go to [email or web address] - password [email or web address] [facility]  [facility]  Office  [phone]  CC: Primary care physician; No PCP Per Patient

## 2015-11-15 NOTE — Progress Notes (Signed)
[facility]  ROUNDING NOTE   Subjective:  Blood was drawn this AM to follow up on renal function. Pt resting in bed comfortably.  Had breakfast this AM.   Objective:  Vital signs in last 24 hours:  Temp:  [98.3 F (36.8 C)-98.6 F (37 C)] 98.3 F (36.8 C) (11/25 0450) Pulse Rate:  [66-80] 73 (11/25 0450) Resp:  [16-20] 20 (11/25 0450) BP: (97-131)/(40-82) 131/81 mmHg (11/25 0450) SpO2:  [95 %-100 %] 100 % (11/25 0450)  Weight change:  Filed Weights   11/06/15 1250  Weight: 97.07 kg (214 lb)    Intake/Output: I/O last 3 completed shifts: In: 316.2 [I.V.:316.2] Out: -    Intake/Output this shift:  Total I/O In: 147.3 [I.V.:147.3] Out: -   Physical Exam: General: NAD  Head: Normocephalic, atraumatic. Moist oral mucosal membranes  Eyes: Icterus noted  Neck: Supple, trachea midline  Lungs:  Clear to auscultation normal effort  Heart: Regular rate and rhythm  Abdomen:  Soft, nontender, BS present  Extremities:  no peripheral edema.  Neurologic: Nonfocal, moving all four extremities  Skin: No lesions       Basic Metabolic Panel:  Recent Labs Lab 11/10/15 0513 11/11/15 0906 11/12/15 0512 11/13/15 0616 11/13/15 1039  NA 141 140 140 138 136  K 4.5 4.9 4.2 3.5 3.4*  CL 119* 116* 112* 104 102  CO2 15* 17* 22 25 26   GLUCOSE 116* 109* 87 88 89  BUN 43* 41* 39* 36* 38*  CREATININE SEE COMMENTS ICTERUS AT THIS LEVEL MAY AFFECT RESULT UNABLE TO REPORT DUE TO SEVERE ICTERUS UNABLE TO REPORT DUE TO ICTERUS INTERFERENCE UNABLE TO REPORT DUE TO ICTERUS  CALCIUM 9.2 9.2 9.1 8.7* 8.5*    Liver Function Tests:  Recent Labs Lab 11/11/15 0906 11/13/15 1039 11/14/15 0454  AST 92* 90* 107*  ALT 51 51 58*  ALKPHOS 53 53 56  BILITOT 26.7* 25.6* 26.7*  PROT 6.9 6.1* 6.5  ALBUMIN 1.6* 1.5* 1.7*   No results for input(s): LIPASE, AMYLASE in the last 168 hours. No results for input(s): AMMONIA in the last 168 hours.  CBC:  Recent Labs Lab  11/10/15 0513 11/12/15 0512 11/13/15 0616 11/14/15 0454 11/14/15 1235  WBC 15.3* 16.2* 18.4* 19.0* 16.2*  HGB 9.9* 9.3* 9.1* 9.9* 9.2*  HCT 30.9* 29.0* 27.6* 29.9* 28.0*  MCV 95.9 96.9 95.0 94.6 96.4  PLT 139* 89* 92* 111* 105*    Cardiac Enzymes: No results for input(s): CKTOTAL, CKMB, CKMBINDEX, TROPONINI in the last 168 hours.  BNP: Invalid input(s): POCBNP  CBG: No results for input(s): GLUCAP in the last 168 hours.  Microbiology: Results for orders placed or performed during the hospital encounter of 11/06/15  Culture, blood (routine x 2)     Status: None   Collection Time: 11/06/15  9:08 PM  Result Value Ref Range Status   Specimen Description BLOOD RIGHT HAND  Final   Special Requests BOTTLES DRAWN AEROBIC AND ANAEROBIC 5ML  Final   Culture NO GROWTH 7 DAYS  Final   Report Status 11/13/2015 FINAL  Final  Culture, blood (routine x 2)     Status: None   Collection Time: 11/06/15  9:09 PM  Result Value Ref Range Status   Specimen Description BLOOD RIGHT ANTECUBITAL  Final   Special Requests   Final    BOTTLES DRAWN AEROBIC AND ANAEROBIC 10MLANAEROBIC, 14MLAEROBIC   Culture NO GROWTH 7 DAYS  Final   Report Status 11/13/2015 FINAL  Final  Urine culture  Status: None   Collection Time: 11/07/15  1:30 AM  Result Value Ref Range Status   Specimen Description URINE, CATHETERIZED  Final   Special Requests NONE  Final   Culture INSIGNIFICANT GROWTH  Final   Report Status 11/08/2015 FINAL  Final  C difficile quick scan w PCR reflex     Status: None   Collection Time: 11/12/15  6:00 PM  Result Value Ref Range Status   C Diff antigen NEGATIVE NEGATIVE Final   C Diff toxin NEGATIVE NEGATIVE Final   C Diff interpretation Negative for C. difficile  Final    Coagulation Studies:  Recent Labs  11/14/15 0454  LABPROT 18.3*  INR 1.51    Urinalysis: No results for input(s): COLORURINE, LABSPEC, PHURINE, GLUCOSEU, HGBUR, BILIRUBINUR, KETONESUR, PROTEINUR,  UROBILINOGEN, NITRITE, LEUKOCYTESUR in the last 72 hours.  Invalid input(s): APPERANCEUR    Imaging: Dg Chest Port 1 View  11/14/2015  CLINICAL DATA:  Cough and chest pain EXAM: PORTABLE CHEST - 1 VIEW COMPARISON:  11/06/2015 FINDINGS: Cardiac shadow is within normal limits. The lungs are well aerated bilaterally. Mild peribronchial cuffing is noted which may represent some bronchitis. No focal infiltrate or sizable effusion is seen. Postsurgical changes in the left humerus are noted. IMPRESSION: Changes consistent with mild bronchitis. No focal infiltrate is seen. Electronically Signed   By: Inez Catalina M.D.   On: 11/14/2015 10:33     Medications:   . octreotide  (SANDOSTATIN)    IV infusion 50 mcg/hr (11/14/15 2203)   . folic acid  1 mg Oral Daily  . lactulose  10 g Oral BID  . multivitamin with minerals  1 tablet Oral Daily  . nadolol  20 mg Oral Daily  . pantoprazole (PROTONIX) IV  40 mg Intravenous Q12H  . prednisoLONE  40 mg Oral QAC breakfast  . sodium chloride  3 mL Intravenous Q12H  . thiamine  100 mg Oral Daily   morphine injection, ondansetron **OR** ondansetron (ZOFRAN) IV  Assessment/ Plan:  49 y.o. female with a PMHx of hypertension, seizure disorder,alcohol abuse, substance abuse (cocaine) who was admitted to [facility] on 11/06/2015 for evaluation of abdominal pain, malaise, and body aches.  1. Acute renal failure. 2. Hyponatremia. 3. Hypokalemia. - improved 4. Liver cirrhosis. 5. Hyperbilirubinemia. 6. Anemia unspecified with melena.  Plan: Patient had blood drawn this a.m. Hopefully her hyperbilirubinemia has improved enough to be able to calculate creatinine. Her BUN however remained slightly high. Her hyponatremia has improved.  Last potassium was still slightly low at 3.4. As before her underlying issue appears to be very advanced liver cirrhosis and she appears to have a poor prognosis from this. We will continue to monitor her status.   LOS: 9 Arham Symmonds,  Almond Fitzgibbon 11/25/20169:22 AM

## 2015-11-15 NOTE — Progress Notes (Signed)
Palliative Care Update  Pt was not disturbed this evening when I noted she was sleeping.  Please refer to my last note which details information about her home setting.    Pt has 17 steps to get into her apartment.  Her adult son who lives with her is disabled with mental illness ('bipolar schizophrenic' per pt).  She has not been forthcoming about whether her home is where the drugs are sold/ consumed or whether she goes to another 'party house'.  She says she 'has to go to meetings' to stay away from alcohol. She states that her son does not use substances and that he can drive and can cook. But he is not usually a care-giver to his mother. And patient has had very few visitors (see my last note).  I note PT recommendations for Home Health --yet pt still remains highly likely to be terminal within 6 months due to liver disease alone.  Her kidney function is slightly improving, however, and she may have some more time to live, but she might be better served by having [facility] involved so that she could transition to Hospice when she declines furhter or when she relapses and then declines suddetly.   Any plans for discharge home should include an assessment of how realistic it is for her to navigate those 17 steps and also for her to continue to function/ survive while being somewhat of a caregiver to her adult son.    Will talk with her attending and social work.     Albertha Ghee, MD

## 2015-11-15 NOTE — Progress Notes (Signed)
GI Inpatient Follow-up Note  Patient Identification: Abigail Cook is a 49 y.o. female with ETOH / cocaine liver disease, hematemesis.   Subjective:  No complaints today. Denies abd pain, n/v, f/c, blood in stools, black stools.   Scheduled Inpatient Medications:  . folic acid  1 mg Oral Daily  . lactulose  10 g Oral BID  . multivitamin with minerals  1 tablet Oral Daily  . nadolol  20 mg Oral Daily  . pantoprazole (PROTONIX) IV  40 mg Intravenous Q12H  . prednisoLONE  40 mg Oral QAC breakfast  . sodium chloride  3 mL Intravenous Q12H  . thiamine  100 mg Oral Daily    Continuous Inpatient Infusions:   . octreotide  (SANDOSTATIN)    IV infusion 50 mcg/hr (11/15/15 2012)    PRN Inpatient Medications:  morphine injection, ondansetron **OR** ondansetron (ZOFRAN) IV    Physical Examination: BP 99/50 mmHg  Pulse 57  Temp(Src) 98.1 F (36.7 C) (Oral)  Resp 16  Ht 5\' 2"  (1.575 m)  Wt 97.07 kg (214 lb)  BMI 39.13 kg/m2  SpO2 97% Gen: NAD, alert and oriented x 2 Neck: supple, no JVD or thyromegaly Chest: CTA bilaterally, no wheezes, crackles, or other adventitious sounds CV: RRR, no m/g/c/r Abd: soft, NT, ND, +BS in all four quadrants; no HSM, guarding, ridigity, or rebound tenderness Ext: no edema, well perfused with 2+ pulses, Skin: no rash or lesions noted Lymph: no LAD Neuro: No asterixis.   Data: Lab Results  Component Value Date   WBC 16.2* 11/14/2015   HGB 9.2* 11/14/2015   HCT 28.0* 11/14/2015   MCV 96.4 11/14/2015   PLT 105* 11/14/2015    Recent Labs Lab 11/13/15 0616 11/14/15 0454 11/14/15 1235  HGB 9.1* 9.9* 9.2*   Lab Results  Component Value Date   NA 134* 11/15/2015   K 3.6 11/15/2015   CL 101 11/15/2015   CO2 24 11/15/2015   BUN 38* 11/15/2015   CREATININE UNABLE TO REPORT DUE TO ICTERUS 11/15/2015   Lab Results  Component Value Date   ALT 58* 11/14/2015   AST 107* 11/14/2015   ALKPHOS 56 11/14/2015   BILITOT 26.7* 11/14/2015     Recent Labs Lab 11/14/15 0454  INR 1.51   Assessment/Plan: Abigail Cook is a 49 y.o. female with  1.) ETOH and cocaine induced Hepatitis 2.) H.E 3.) Hx Hematemesis 4.) Renal Failure  Very high 30 day mortality based on discrimant function.   Recommendations: - cont prednisolone for ETOH Hepatitis - cont protonix 40 q12 for now - stop octreotide after 5 days.  - titrate lactulose to 2 - 3 stools daily - cont nadolol, titrate to HR 60 - ETOH and cocaine cessation. - EGD once above stabilized for hx of hematemesis on presentation.   Please call with questions or concerns.  Wakeelah Solan, Grace Blight, MD

## 2015-11-15 NOTE — Clinical Social Work Note (Signed)
Patient still desires to return home at discharge. CSW watched patient as she ambulated with a walker today out to the nurse's station. [place], [place] [phone]

## 2015-11-16 LAB — COMPREHENSIVE METABOLIC PANEL
ALK PHOS: 64 U/L (ref 38–126)
ALT: 77 U/L — AB (ref 14–54)
AST: 146 U/L — ABNORMAL HIGH (ref 15–41)
Albumin: 1.6 g/dL — ABNORMAL LOW (ref 3.5–5.0)
Anion gap: 8 (ref 5–15)
BUN: 30 mg/dL — ABNORMAL HIGH (ref 6–20)
CALCIUM: 7.9 mg/dL — AB (ref 8.9–10.3)
CO2: 24 mmol/L (ref 22–32)
Chloride: 99 mmol/L — ABNORMAL LOW (ref 101–111)
Glucose, Bld: 122 mg/dL — ABNORMAL HIGH (ref 65–99)
Potassium: 3.2 mmol/L — ABNORMAL LOW (ref 3.5–5.1)
Sodium: 131 mmol/L — ABNORMAL LOW (ref 135–145)
TOTAL PROTEIN: 6.2 g/dL — AB (ref 6.5–8.1)
Total Bilirubin: 25.9 mg/dL (ref 0.3–1.2)

## 2015-11-16 MED ORDER — ALBUMIN HUMAN 25 % IV SOLN
25.0000 g | Freq: Three times a day (TID) | INTRAVENOUS | Status: DC
  Administered 2015-11-16 – 2015-11-20 (×10): 25 g via INTRAVENOUS
  Filled 2015-11-16 (×17): qty 100

## 2015-11-16 NOTE — Progress Notes (Signed)
Notified Dr. Vianne Bulls of critical lab of bilirubin 25.9, though trending down from last value. MD acknowledged. No new orders at this time.

## 2015-11-16 NOTE — Progress Notes (Signed)
[facility]  ROUNDING NOTE   Subjective:  Pt seen at bedside. Cr assay continues to have interference with bilirubin. Pt placed on albumin this AM.  Total bilirbuin remains high at 26.   Objective:  Vital signs in last 24 hours:  Temp:  [97.2 F (36.2 C)-98.2 F (36.8 C)] 98.2 F (36.8 C) (11/26 0404) Pulse Rate:  [57-65] 64 (11/26 0404) Resp:  [16-18] 18 (11/26 0404) BP: (99-127)/(50-75) 127/75 mmHg (11/26 0404) SpO2:  [97 %-100 %] 100 % (11/26 0404)  Weight change:  Filed Weights   11/06/15 1250  Weight: 97.07 kg (214 lb)    Intake/Output: I/O last 3 completed shifts: In: 1525.3 [P.O.:480; I.V.:1045.3] Out: -    Intake/Output this shift:  Total I/O In: 240 [P.O.:240] Out: 200 [Urine:200]  Physical Exam: General: NAD  Head: Normocephalic, atraumatic. Moist oral mucosal membranes  Eyes: Icterus noted  Neck: Supple, trachea midline  Lungs:  Clear to auscultation normal effort  Heart: Regular rate and rhythm  Abdomen:  Soft, nontender, BS present  Extremities:  no peripheral edema.  Neurologic: Nonfocal, moving all four extremities  Skin: No lesions       Basic Metabolic Panel:  Recent Labs Lab 11/11/15 0906 11/12/15 0512 11/13/15 0616 11/13/15 1039 11/15/15 0826  NA 140 140 138 136 134*  K 4.9 4.2 3.5 3.4* 3.6  CL 116* 112* 104 102 101  CO2 17* 22 25 26 24   GLUCOSE 109* 87 88 89 129*  BUN 41* 39* 36* 38* 38*  CREATININE ICTERUS AT THIS LEVEL MAY AFFECT RESULT UNABLE TO REPORT DUE TO SEVERE ICTERUS UNABLE TO REPORT DUE TO ICTERUS INTERFERENCE UNABLE TO REPORT DUE TO ICTERUS UNABLE TO REPORT DUE TO ICTERUS  CALCIUM 9.2 9.1 8.7* 8.5* 8.2*    Liver Function Tests:  Recent Labs Lab 11/11/15 0906 11/13/15 1039 11/14/15 0454  AST 92* 90* 107*  ALT 51 51 58*  ALKPHOS 53 53 56  BILITOT 26.7* 25.6* 26.7*  PROT 6.9 6.1* 6.5  ALBUMIN 1.6* 1.5* 1.7*   No results for input(s): LIPASE, AMYLASE in the last 168 hours. No results for  input(s): AMMONIA in the last 168 hours.  CBC:  Recent Labs Lab 11/10/15 0513 11/12/15 0512 11/13/15 0616 11/14/15 0454 11/14/15 1235  WBC 15.3* 16.2* 18.4* 19.0* 16.2*  HGB 9.9* 9.3* 9.1* 9.9* 9.2*  HCT 30.9* 29.0* 27.6* 29.9* 28.0*  MCV 95.9 96.9 95.0 94.6 96.4  PLT 139* 89* 92* 111* 105*    Cardiac Enzymes: No results for input(s): CKTOTAL, CKMB, CKMBINDEX, TROPONINI in the last 168 hours.  BNP: Invalid input(s): POCBNP  CBG: No results for input(s): GLUCAP in the last 168 hours.  Microbiology: Results for orders placed or performed during the hospital encounter of 11/06/15  Culture, blood (routine x 2)     Status: None   Collection Time: 11/06/15  9:08 PM  Result Value Ref Range Status   Specimen Description BLOOD RIGHT HAND  Final   Special Requests BOTTLES DRAWN AEROBIC AND ANAEROBIC 5ML  Final   Culture NO GROWTH 7 DAYS  Final   Report Status 11/13/2015 FINAL  Final  Culture, blood (routine x 2)     Status: None   Collection Time: 11/06/15  9:09 PM  Result Value Ref Range Status   Specimen Description BLOOD RIGHT ANTECUBITAL  Final   Special Requests   Final    BOTTLES DRAWN AEROBIC AND ANAEROBIC 10MLANAEROBIC, 14MLAEROBIC   Culture NO GROWTH 7 DAYS  Final   Report  Status 11/13/2015 FINAL  Final  Urine culture     Status: None   Collection Time: 11/07/15  1:30 AM  Result Value Ref Range Status   Specimen Description URINE, CATHETERIZED  Final   Special Requests NONE  Final   Culture INSIGNIFICANT GROWTH  Final   Report Status 11/08/2015 FINAL  Final  C difficile quick scan w PCR reflex     Status: None   Collection Time: 11/12/15  6:00 PM  Result Value Ref Range Status   C Diff antigen NEGATIVE NEGATIVE Final   C Diff toxin NEGATIVE NEGATIVE Final   C Diff interpretation Negative for C. difficile  Final    Coagulation Studies:  Recent Labs  11/14/15 0454  LABPROT 18.3*  INR 1.51    Urinalysis: No results for input(s): COLORURINE, LABSPEC,  PHURINE, GLUCOSEU, HGBUR, BILIRUBINUR, KETONESUR, PROTEINUR, UROBILINOGEN, NITRITE, LEUKOCYTESUR in the last 72 hours.  Invalid input(s): APPERANCEUR    Imaging: No results found.   Medications:   . octreotide  (SANDOSTATIN)    IV infusion 50 mcg/hr (11/16/15 0719)   . albumin human  25 g Intravenous Q000111Q  . folic acid  1 mg Oral Daily  . lactulose  10 g Oral BID  . multivitamin with minerals  1 tablet Oral Daily  . nadolol  20 mg Oral Daily  . pantoprazole (PROTONIX) IV  40 mg Intravenous Q12H  . prednisoLONE  40 mg Oral QAC breakfast  . sodium chloride  3 mL Intravenous Q12H  . thiamine  100 mg Oral Daily   morphine injection, ondansetron **OR** ondansetron (ZOFRAN) IV  Assessment/ Plan:  49 y.o. female with a PMHx of hypertension, seizure disorder,alcohol abuse, substance abuse (cocaine) who was admitted to [facility] on 11/06/2015 for evaluation of abdominal pain, malaise, and body aches.  1. Acute renal failure. 2. Hyponatremia. 3. Hypokalemia. - improved 4. Liver cirrhosis. 5. Hyperbilirubinemia. 6. Anemia unspecified with melena.  Plan: Pt continues to have significant hyperbilirubinemia.  Therefore we are unable to calculate Cr.  BUN remains about the same as before.  Will start albumin 25 grams IV q8 hours to see if this could potentially improve renal function.  Unclear if her hyperbilirubinemia will improve or not.  Overall poor prognosis noted given hepatic indices.  Will continue to follow with you.    LOS: 10 Yoseline Andersson 11/26/201611:37 AM

## 2015-11-16 NOTE — Consult Note (Signed)
  GI Inpatient Follow-up Note  Patient Identification: Abigail Cook is a 49 y.o. female  Subjective: No significant changes last few days, except for some vomiting of blood few days ago. On octreotide drip now. LFT remains high. No abdominal pain.   Scheduled Inpatient Medications:  . albumin human  25 g Intravenous Q000111Q  . folic acid  1 mg Oral Daily  . lactulose  10 g Oral BID  . multivitamin with minerals  1 tablet Oral Daily  . nadolol  20 mg Oral Daily  . pantoprazole (PROTONIX) IV  40 mg Intravenous Q12H  . prednisoLONE  40 mg Oral QAC breakfast  . sodium chloride  3 mL Intravenous Q12H  . thiamine  100 mg Oral Daily    Continuous Inpatient Infusions:   . octreotide  (SANDOSTATIN)    IV infusion 50 mcg/hr (11/16/15 0719)    PRN Inpatient Medications:  morphine injection, ondansetron **OR** ondansetron (ZOFRAN) IV  Review of Systems: Constitutional: Weight is stable.  Eyes: No changes in vision. ENT: No oral lesions, sore throat.  GI: see HPI.  Heme/Lymph: No easy bruising.  CV: No chest pain.  GU: No hematuria.  Integumentary: No rashes.  Neuro: No headaches.  Psych: No depression/anxiety.  Endocrine: No heat/cold intolerance.  Allergic/Immunologic: No urticaria.  Resp: No cough, SOB.  Musculoskeletal: No joint swelling.    Physical Examination: BP 127/75 mmHg  Pulse 64  Temp(Src) 98.2 F (36.8 C) (Oral)  Resp 18  Ht 5\' 2"  (1.575 m)  Wt 97.07 kg (214 lb)  BMI 39.13 kg/m2  SpO2 100% Gen: NAD, alert and oriented x 4 HEENT: PEERLA, EOMI, Neck: supple, no JVD or thyromegaly Chest: CTA bilaterally, no wheezes, crackles, or other adventitious sounds CV: RRR, no m/g/c/r Abd: soft, NT, ND, +BS in all four quadrants; no HSM, guarding, ridigity, or rebound tenderness Ext: no edema, well perfused with 2+ pulses, Skin: no rash or lesions noted Lymph: no LAD  Data: Lab Results  Component Value Date   WBC 16.2* 11/14/2015   HGB 9.2* 11/14/2015   HCT  28.0* 11/14/2015   MCV 96.4 11/14/2015   PLT 105* 11/14/2015    Recent Labs Lab 11/13/15 0616 11/14/15 0454 11/14/15 1235  HGB 9.1* 9.9* 9.2*   Lab Results  Component Value Date   NA 134* 11/15/2015   K 3.6 11/15/2015   CL 101 11/15/2015   CO2 24 11/15/2015   BUN 38* 11/15/2015   CREATININE UNABLE TO REPORT DUE TO ICTERUS 11/15/2015   Lab Results  Component Value Date   ALT 58* 11/14/2015   AST 107* 11/14/2015   ALKPHOS 56 11/14/2015   BILITOT 26.7* 11/14/2015    Recent Labs Lab 11/14/15 0454  INR 1.51   Assessment/Plan: Ms. Bonn is a 49 y.o. female with UGI bleeding, with both liver and renal failure.  Recommendations: Plan EGD on Monday if cocaine finally out of her system. Please check urine tox screen again tomorrow. Thanks. Please call with questions or concerns.  Jessika Rothery, Lupita Dawn, MD

## 2015-11-16 NOTE — Progress Notes (Signed)
[facility] [place] NAME: Abigail Cook    MR#:  [id number]  DATE OF BIRTH:  02-15-1966  SUBJECTIVe ;denies any complaints.  CHIEF COMPLAINT:   Chief Complaint  Patient presents with  . Abdominal Pain  . Chest Pain  . Leg Pain      . Review of Systems  Constitutional: Positive for malaise/fatigue. Negative for fever, chills and weight loss.  HENT: Negative for congestion.   Eyes: Negative for blurred vision and double vision.  Respiratory: Negative for cough, sputum production, shortness of breath and wheezing.   Cardiovascular: Negative for chest pain, palpitations, orthopnea, leg swelling and PND.  Gastrointestinal: Soft, bowel sounds present constipation and blood in stool.  Genitourinary: Negative for dysuria, urgency, frequency and hematuria.  Musculoskeletal: . Negative for falls.  Neurological: Negative for dizziness, tremors, focal weakness and headaches.  Endo/Heme/Allergies: Does not bruise/bleed easily.  Psychiatric/Behavioral: Negative for depression. The patient does not have insomnia.     VITAL SIGNS: Blood pressure 127/75, pulse 64, temperature 98.2 F (36.8 C), temperature source Oral, resp. rate 18, height 5\' 2"  (1.575 m), weight 97.07 kg (214 lb), SpO2 100 %.  PHYSICAL EXAMINATION:   GENERAL:  48 y.o.-year-old patient lying in the bed no distress chronically ill-appearing Dry oral mucosa EYES: Pupils equal, round, reactive to light and accommodation. Positive scleral icterus. Extraocular muscles intact.  HEENT: Head atraumatic, normocephalic. Oropharynx and nasopharynx clear.  NECK:  Supple, no jugular venous distention. No thyroid enlargement, no tenderness.  LUNGS: Some diminished breath sounds bilaterally, no wheezing, rales,rhonchi or crepitation. No use of accessory muscles of respiration.  CARDIOVASCULAR: S1, S2 normal. No murmurs, rubs, or gallops.  ABDOMEN: Soft , , distended Bowel sounds  present. No organomegaly or mass.  EXTREMITIES: No pedal edema, cyanosis, or clubbing. Some CVA tenderness on the left on percussion NEUROLOGIC: Cranial nerves II through XII are intact. Muscle strength 5/5 in all extremities. Sensation intact. Gait not checked.  PSYCHIATRIC: The patient is alert and oriented x 3.  SKIN: No obvious rash, lesion, or ulcer.   ORDERS/RESULTS REVIEWED:   CBC  Recent Labs Lab 11/10/15 0513 11/12/15 0512 11/13/15 0616 11/14/15 0454 11/14/15 1235  WBC 15.3* 16.2* 18.4* 19.0* 16.2*  HGB 9.9* 9.3* 9.1* 9.9* 9.2*  HCT 30.9* 29.0* 27.6* 29.9* 28.0*  PLT 139* 89* 92* 111* 105*  MCV 95.9 96.9 95.0 94.6 96.4  MCH 30.6 31.1 31.3 31.2 31.6  MCHC 31.9* 32.1 32.9 33.0 32.8  RDW 26.9* 27.2* 28.1* 28.0* 28.7*   ------------------------------------------------------------------------------------------------------------------  Chemistries   Recent Labs Lab 11/11/15 0906 11/12/15 0512 11/13/15 0616 11/13/15 1039 11/14/15 0454 11/15/15 0826  NA 140 140 138 136  --  134*  K 4.9 4.2 3.5 3.4*  --  3.6  CL 116* 112* 104 102  --  101  CO2 17* 22 25 26   --  24  GLUCOSE 109* 87 88 89  --  129*  BUN 41* 39* 36* 38*  --  38*  CREATININE ICTERUS AT THIS LEVEL MAY AFFECT RESULT UNABLE TO REPORT DUE TO SEVERE ICTERUS UNABLE TO REPORT DUE TO ICTERUS INTERFERENCE UNABLE TO REPORT DUE TO ICTERUS  --  UNABLE TO REPORT DUE TO ICTERUS  CALCIUM 9.2 9.1 8.7* 8.5*  --  8.2*  AST 92*  --   --  90* 107*  --   ALT 51  --   --  51 58*  --   ALKPHOS 53  --   --  53 56  --   BILITOT 26.7*  --   --  25.6* 26.7*  --    ------------------------------------------------------------------------------------------------------------------ CrCl cannot be calculated (Patient has no serum creatinine result on file.). ------------------------------------------------------------------------------------------------------------------ No results for input(s): TSH, T4TOTAL, T3FREE, THYROIDAB in  the last 72 hours.  Invalid input(s): FREET3  Cardiac Enzymes No results for input(s): CKMB, TROPONINI, MYOGLOBIN in the last 168 hours.  Invalid input(s): CK ------------------------------------------------------------------------------------------------------------------ Invalid input(s): POCBNP ---------------------------------------------------------------------------------------------------------------  RADIOLOGY: No results found.  EKG:  Orders placed or performed during the hospital encounter of 11/06/15  . EKG 12-Lead  . EKG 12-Lead  . ED EKG within 10 minutes  . ED EKG within 10 minutes    ASSESSMENT AND PLAN:  1. Acute renal failure,  . Unable to accurately measure patient's renal function due to her bilirubin being high. Continue to monitor.Discontinued   IV bicarbonate drip secondary to a improved metabolic acidosis,  2. Right upper quadrant abdominal pain, due to alcoholic hepatitis versus alcoholic gastritis, continue Protonix twice daily  Coughing up blood;gi bleed/; started on octreotide drip continue for 5 days as per GI.Marland Kitchen  Hemoglobin is stable.   3. Hepatic encephalopathy; improved, he she is alert and oriented.  . 4. Gastrointestinal  bleed, 'unable to do EGD due to cocaine being positive in urine drug screen' patient EGD colonoscopies will be scheduled as an outpatient. As per GI. We'll order drug screen  for tomorrow if it is negative plan for EGD on Monday I discussed the case with Dr. Candace Cruise.   5. Acute posthemorrhagic anemia , status post transfusion hgb stable 6. Hyponatremia, Resolved with  rehydration  7.hypokalemia resolved  8. Coagulopathy , due to liver disease , that is post vitamin K  9. Liver cirrhosis , ultrasound failed to show any masses , severe portal hypertension was noted , patient has alcoholic hepatitis, hepatitis C causing liver cirrhosis: Discrimination factor is 33, started on prednisolone 40 mg of prednisone. Discontinued the Rocephin  for SBP prophylaxis. Very high 30 day mortality   secondary to advanced liver cirrhosis ongoing the alcoholic abuse   [id number]. Plan continue octreotide drip for 2 more days after that if she is stable discharge home with home health.  DRUG ALLERGIES: No Known Allergies  CODE STATUS:     Code Status Orders        Start     Ordered   11/07/15 0025  Full code   Continuous     11/07/15 0024      Time spent 37 minutes  Mauriah Mcmillen M.D on 11/16/2015 at 11:37 AM  Between 7am to 6pm - Pager - [phone]  After 6pm go to [email or web address] - password [email or web address] [facility]  [facility]  Office  [phone]  CC: Primary care physician; No PCP Per Patient

## 2015-11-17 LAB — URINE DRUG SCREEN, QUALITATIVE (ARMC ONLY)
Amphetamines, Ur Screen: NOT DETECTED
BARBITURATES, UR SCREEN: NOT DETECTED
BENZODIAZEPINE, UR SCRN: NOT DETECTED
CANNABINOID 50 NG, UR ~~LOC~~: NOT DETECTED
Cocaine Metabolite,Ur ~~LOC~~: NOT DETECTED
MDMA (Ecstasy)Ur Screen: NOT DETECTED
METHADONE SCREEN, URINE: NOT DETECTED
Opiate, Ur Screen: POSITIVE — AB
Phencyclidine (PCP) Ur S: NOT DETECTED
TRICYCLIC, UR SCREEN: NOT DETECTED

## 2015-11-17 LAB — CBC
HCT: 28.9 % — ABNORMAL LOW (ref 35.0–47.0)
HEMOGLOBIN: 9.4 g/dL — AB (ref 12.0–16.0)
MCH: 31.8 pg (ref 26.0–34.0)
MCHC: 32.4 g/dL (ref 32.0–36.0)
MCV: 98 fL (ref 80.0–100.0)
PLATELETS: 127 10*3/uL — AB (ref 150–440)
RBC: 2.95 MIL/uL — AB (ref 3.80–5.20)
RDW: 28.4 % — ABNORMAL HIGH (ref 11.5–14.5)
WBC: 16.3 10*3/uL — ABNORMAL HIGH (ref 3.6–11.0)

## 2015-11-17 MED ORDER — POTASSIUM CHLORIDE 20 MEQ/15ML (10%) PO SOLN
40.0000 meq | Freq: Once | ORAL | Status: AC
  Administered 2015-11-17: 40 meq via ORAL
  Filled 2015-11-17: qty 30

## 2015-11-17 NOTE — Progress Notes (Signed)
[facility] at [place] NAME: Abigail Cook    MR#:  [id number]  DATE OF BIRTH:  13-May-1966  SUBJECTIVE; and is seen today. Complains of cough. CHIEF COMPLAINT:   Chief Complaint  Patient presents with  . Abdominal Pain  . Chest Pain  . Leg Pain      . Review of Systems  Constitutional: Positive for malaise/fatigue. Negative for fever, chills and weight loss.  HENT: Negative for congestion.   Eyes: Negative for blurred vision and double vision.  Respiratory: Has some cough today. sputum production, shortness of breath and wheezing.   Cardiovascular: Negative for chest pain, palpitations, orthopnea, leg swelling and PND.  Gastrointestinal: Soft, bowel sounds present constipation and blood in stool.  Genitourinary: Negative for dysuria, urgency, frequency and hematuria.  Musculoskeletal: . Negative for falls.  Neurological: Negative for dizziness, tremors, focal weakness and headaches.  Endo/Heme/Allergies: Does not bruise/bleed easily.  Psychiatric/Behavioral: Negative for depression. The patient does not have insomnia.     VITAL SIGNS: Blood pressure 119/70, pulse 59, temperature 98 F (36.7 C), temperature source Oral, resp. rate 20, height 5\' 2"  (1.575 m), weight 97.07 kg (214 lb), SpO2 100 %.  PHYSICAL EXAMINATION:   GENERAL:  49 y.o.-year-old patient lying in the bed no distress chronically ill-appearing Dry oral mucosa EYES: Pupils equal, round, reactive to light and accommodation. Positive scleral icterus. Extraocular muscles intact.  HEENT: Head atraumatic, normocephalic. Oropharynx and nasopharynx clear.  NECK:  Supple, no jugular venous distention. No thyroid enlargement, no tenderness.  LUNGS: Some diminished breath sounds bilaterally, no wheezing, rales,rhonchi or crepitation. No use of accessory muscles of respiration.  CARDIOVASCULAR: S1, S2 normal. No murmurs, rubs, or gallops.  ABDOMEN: Soft , , distended Bowel  sounds present. No organomegaly or mass.  EXTREMITIES bilateral lateral extremity edema present  NEUROLOGIC: Cranial nerves II through XII are intact. Muscle strength 5/5 in all extremities. Sensation intact. Gait not checked.  PSYCHIATRIC: The patient is alert and oriented x 3.  SKIN: No obvious rash, lesion, or ulcer.   ORDERS/RESULTS REVIEWED:   CBC  Recent Labs Lab 11/12/15 0512 11/13/15 0616 11/14/15 0454 11/14/15 1235 11/17/15 0450  WBC 16.2* 18.4* 19.0* 16.2* 16.3*  HGB 9.3* 9.1* 9.9* 9.2* 9.4*  HCT 29.0* 27.6* 29.9* 28.0* 28.9*  PLT 89* 92* 111* 105* 127*  MCV 96.9 95.0 94.6 96.4 98.0  MCH 31.1 31.3 31.2 31.6 31.8  MCHC 32.1 32.9 33.0 32.8 32.4  RDW 27.2* 28.1* 28.0* 28.7* 28.4*   ------------------------------------------------------------------------------------------------------------------  Chemistries   Recent Labs Lab 11/11/15 0906 11/12/15 0512 11/13/15 0616 11/13/15 1039 11/14/15 0454 11/15/15 0826 11/16/15 1224  NA 140 140 138 136  --  134* 131*  K 4.9 4.2 3.5 3.4*  --  3.6 3.2*  CL 116* 112* 104 102  --  101 99*  CO2 17* 22 25 26   --  24 24  GLUCOSE 109* 87 88 89  --  129* 122*  BUN 41* 39* 36* 38*  --  38* 30*  CREATININE ICTERUS AT THIS LEVEL MAY AFFECT RESULT UNABLE TO REPORT DUE TO SEVERE ICTERUS UNABLE TO REPORT DUE TO ICTERUS INTERFERENCE UNABLE TO REPORT DUE TO ICTERUS  --  UNABLE TO REPORT DUE TO ICTERUS SEE COMMENTS  CALCIUM 9.2 9.1 8.7* 8.5*  --  8.2* 7.9*  AST 92*  --   --  90* 107*  --  146*  ALT 51  --   --  51 58*  --  77*  ALKPHOS 53  --   --  53 56  --  64  BILITOT 26.7*  --   --  25.6* 26.7*  --  25.9*   ------------------------------------------------------------------------------------------------------------------ CrCl cannot be calculated (Patient has no serum creatinine result on file.). ------------------------------------------------------------------------------------------------------------------ No results for  input(s): TSH, T4TOTAL, T3FREE, THYROIDAB in the last 72 hours.  Invalid input(s): FREET3  Cardiac Enzymes No results for input(s): CKMB, TROPONINI, MYOGLOBIN in the last 168 hours.  Invalid input(s): CK ------------------------------------------------------------------------------------------------------------------ Invalid input(s): POCBNP ---------------------------------------------------------------------------------------------------------------  RADIOLOGY: No results found.  EKG:  Orders placed or performed during the hospital encounter of 11/06/15  . EKG 12-Lead  . EKG 12-Lead  . ED EKG within 10 minutes  . ED EKG within 10 minutes    ASSESSMENT AND PLAN:  1. Acute renal failure,  . Unable to accurately measure patient's renal function due to her bilirubin being high. Continue to monitor.Discontinued   IV bicarbonate drip secondary to a improved metabolic acidosis, start on IV albumin 25 g every 8 hours as per nephrology recommendation to see if it will improve the kidney function.  2. Right upper quadrant abdominal pain, due to alcoholic hepatitis versus alcoholic gastritis, continue Protonix twice daily  Coughing up blood;gi bleed/; started on octreotide drip continue for 5 days as per GI.Marland Kitchen  Hemoglobin is stable. Tomorrow is the last today for octreotide drip.   3. Hepatic encephalopathy; improved, he she is alert and oriented.  .  4. Gastrointestinal  bleed, 'unable to do EGD due to cocaine being positive in urine drug screen'   Urine drug screen is negative for cocaine today. Patient will possibly have EGD tomorrow.   5. Acute posthemorrhagic anemia , status post transfusion hgb stable  6. Hyponatremia, Resolved with  rehydration   7.hypokalemia resolved   8. Coagulopathy , due to liver disease , that is post vitamin K   9. Liver cirrhosis , secondary to alcohol, hepatitis C; ultrasound failed to show any masses , severe portal hypertension was noted , patient  has alcoholic hepatitis, hepatitis C causing liver cirrhosis: Discrimination factor is 33, started on prednisolone 40 mg of prednisone. Discontinued the Rocephin for SBP prophylaxis. Very high 30 day mortality  Hyperbilirubinemia: Pain is still very high at May take long time to see improvement in jaundice.   #10.  DRUG ALLERGIES: No Known Allergies  CODE STATUS:     Code Status Orders        Start     Ordered   11/07/15 0025  Full code   Continuous     11/07/15 0024      Time spent 9 minutes  Heavin Sebree M.D on 11/17/2015 at 10:22 AM  Between 7am to 6pm - Pager - [phone]  After 6pm go to [email or web address] - password [email or web address] [facility]  [place]  Office  [phone]  CC: Primary care physician; No PCP Per Patient

## 2015-11-17 NOTE — Progress Notes (Signed)
[facility]  ROUNDING NOTE   Subjective:  Patient was started on IV albumin yesterday. BUN is down to 30 today. There continues to be interference with bilirubin and creatinine.  Objective:  Vital signs in last 24 hours:  Temp:  [98 F (36.7 C)-98.9 F (37.2 C)] 98 F (36.7 C) (11/27 0524) Pulse Rate:  [52-59] 59 (11/27 0524) Resp:  [17-20] 20 (11/27 0524) BP: (114-119)/(52-70) 119/70 mmHg (11/27 0524) SpO2:  [97 %-100 %] 100 % (11/27 0524)  Weight change:  Filed Weights   11/06/15 1250  Weight: 97.07 kg (214 lb)    Intake/Output: I/O last 3 completed shifts: In: 1742.5 [P.O.:840; I.V.:508.5; IV Piggyback:394] Out: 400 [Urine:400]   Intake/Output this shift:  Total I/O In: 440 [P.O.:240; IV Piggyback:200] Out: -   Physical Exam: General: NAD  Head: Normocephalic, atraumatic. Moist oral mucosal membranes  Eyes: Icterus noted  Neck: Supple, trachea midline  Lungs:  Clear to auscultation normal effort  Heart: Regular rate and rhythm  Abdomen:  Soft, nontender, BS present  Extremities:  no peripheral edema.  Neurologic: Nonfocal, moving all four extremities  Skin: No lesions       Basic Metabolic Panel:  Recent Labs Lab 11/12/15 0512 11/13/15 0616 11/13/15 1039 11/15/15 0826 11/16/15 1224  NA 140 138 136 134* 131*  K 4.2 3.5 3.4* 3.6 3.2*  CL 112* 104 102 101 99*  CO2 22 25 26 24 24   GLUCOSE 87 88 89 129* 122*  BUN 39* 36* 38* 38* 30*  CREATININE UNABLE TO REPORT DUE TO SEVERE ICTERUS UNABLE TO REPORT DUE TO ICTERUS INTERFERENCE UNABLE TO REPORT DUE TO ICTERUS UNABLE TO REPORT DUE TO ICTERUS SEE COMMENTS  CALCIUM 9.1 8.7* 8.5* 8.2* 7.9*    Liver Function Tests:  Recent Labs Lab 11/11/15 0906 11/13/15 1039 11/14/15 0454 11/16/15 1224  AST 92* 90* 107* 146*  ALT 51 51 58* 77*  ALKPHOS 53 53 56 64  BILITOT 26.7* 25.6* 26.7* 25.9*  PROT 6.9 6.1* 6.5 6.2*  ALBUMIN 1.6* 1.5* 1.7* 1.6*   No results for input(s): LIPASE, AMYLASE in  the last 168 hours. No results for input(s): AMMONIA in the last 168 hours.  CBC:  Recent Labs Lab 11/12/15 0512 11/13/15 0616 11/14/15 0454 11/14/15 1235 11/17/15 0450  WBC 16.2* 18.4* 19.0* 16.2* 16.3*  HGB 9.3* 9.1* 9.9* 9.2* 9.4*  HCT 29.0* 27.6* 29.9* 28.0* 28.9*  MCV 96.9 95.0 94.6 96.4 98.0  PLT 89* 92* 111* 105* 127*    Cardiac Enzymes: No results for input(s): CKTOTAL, CKMB, CKMBINDEX, TROPONINI in the last 168 hours.  BNP: Invalid input(s): POCBNP  CBG: No results for input(s): GLUCAP in the last 168 hours.  Microbiology: Results for orders placed or performed during the hospital encounter of 11/06/15  Culture, blood (routine x 2)     Status: None   Collection Time: 11/06/15  9:08 PM  Result Value Ref Range Status   Specimen Description BLOOD RIGHT HAND  Final   Special Requests BOTTLES DRAWN AEROBIC AND ANAEROBIC 5ML  Final   Culture NO GROWTH 7 DAYS  Final   Report Status 11/13/2015 FINAL  Final  Culture, blood (routine x 2)     Status: None   Collection Time: 11/06/15  9:09 PM  Result Value Ref Range Status   Specimen Description BLOOD RIGHT ANTECUBITAL  Final   Special Requests   Final    BOTTLES DRAWN AEROBIC AND ANAEROBIC 10MLANAEROBIC, 14MLAEROBIC   Culture NO GROWTH 7 DAYS  Final  Report Status 11/13/2015 FINAL  Final  Urine culture     Status: None   Collection Time: 11/07/15  1:30 AM  Result Value Ref Range Status   Specimen Description URINE, CATHETERIZED  Final   Special Requests NONE  Final   Culture INSIGNIFICANT GROWTH  Final   Report Status 11/08/2015 FINAL  Final  C difficile quick scan w PCR reflex     Status: None   Collection Time: 11/12/15  6:00 PM  Result Value Ref Range Status   C Diff antigen NEGATIVE NEGATIVE Final   C Diff toxin NEGATIVE NEGATIVE Final   C Diff interpretation Negative for C. difficile  Final    Coagulation Studies: No results for input(s): LABPROT, INR in the last 72 hours.  Urinalysis: No results  for input(s): COLORURINE, LABSPEC, PHURINE, GLUCOSEU, HGBUR, BILIRUBINUR, KETONESUR, PROTEINUR, UROBILINOGEN, NITRITE, LEUKOCYTESUR in the last 72 hours.  Invalid input(s): APPERANCEUR    Imaging: No results found.   Medications:   . octreotide  (SANDOSTATIN)    IV infusion 50 mcg/hr (11/17/15 0900)   . albumin human  25 g Intravenous [id number]  . folic acid  1 mg Oral Daily  . lactulose  10 g Oral BID  . multivitamin with minerals  1 tablet Oral Daily  . nadolol  20 mg Oral Daily  . pantoprazole (PROTONIX) IV  40 mg Intravenous Q12H  . potassium chloride  40 mEq Oral Once  . prednisoLONE  40 mg Oral QAC breakfast  . sodium chloride  3 mL Intravenous Q12H  . thiamine  100 mg Oral Daily   morphine injection, ondansetron **OR** ondansetron (ZOFRAN) IV  Assessment/ Plan:  49 y.o. female with a PMHx of hypertension, seizure disorder,alcohol abuse, substance abuse (cocaine) who was admitted to [facility] on 11/06/2015 for evaluation of abdominal pain, malaise, and body aches.  1. Acute renal failure. 2. Hyponatremia. 3. Hypokalemia. - improved 4. Liver cirrhosis. 5. Hyperbilirubinemia. 6. Anemia unspecified with melena.  Plan: BUN is down to 30.  It appears that there has been some benefit from starting albumin.  We will continue the patient on albumin 25 g IV every 8 hours for now.  Continue to monitor renal function as possible.  As before hyperbilirubinemia won't allow creatinine calculation.  Patient continues to have significant hyperbilirubinemia with a bilirubin of 25 today.  Continue to monitor clinical progress.  LOS: Campbell, Gloriana Piltz 11/27/201610:49 AM

## 2015-11-18 ENCOUNTER — Inpatient Hospital Stay: Payer: Medicaid Other | Admitting: Anesthesiology

## 2015-11-18 ENCOUNTER — Encounter: Payer: Self-pay | Admitting: *Deleted

## 2015-11-18 ENCOUNTER — Encounter
Admission: EM | Disposition: A | Discharge status: Home Health | Payer: Self-pay | Source: Home / Self Care | Attending: Internal Medicine

## 2015-11-18 DIAGNOSIS — B3781 Candidal esophagitis: Secondary | ICD-10-CM

## 2015-11-18 DIAGNOSIS — L539 Erythematous condition, unspecified: Secondary | ICD-10-CM

## 2015-11-18 HISTORY — PX: ESOPHAGOGASTRODUODENOSCOPY: SHX5428

## 2015-11-18 SURGERY — EGD (ESOPHAGOGASTRODUODENOSCOPY)
Anesthesia: General | Laterality: Left

## 2015-11-18 MED ORDER — SODIUM CHLORIDE 0.9 % IV SOLN
INTRAVENOUS | Status: DC
  Administered 2015-11-18 (×2): via INTRAVENOUS

## 2015-11-18 MED ORDER — FLUCONAZOLE 100 MG PO TABS
100.0000 mg | ORAL_TABLET | Freq: Every day | ORAL | Status: DC
  Administered 2015-11-18 – 2015-11-20 (×3): 100 mg via ORAL
  Filled 2015-11-18 (×3): qty 1

## 2015-11-18 MED ORDER — PROPOFOL 10 MG/ML IV BOLUS
INTRAVENOUS | Status: DC | PRN
  Administered 2015-11-18 (×2): 50 mg via INTRAVENOUS
  Administered 2015-11-18: 30 mg via INTRAVENOUS
  Administered 2015-11-18 (×2): 50 mg via INTRAVENOUS

## 2015-11-18 MED ORDER — PROPOFOL 500 MG/50ML IV EMUL
INTRAVENOUS | Status: DC | PRN
  Administered 2015-11-18: 140 ug/kg/min via INTRAVENOUS

## 2015-11-18 NOTE — Progress Notes (Signed)
[place] [facility] [place] NAME: Abigail Cook    MR#:  [id number]  DATE OF BIRTH:  04-17-1966  SUBJECTIVE; s/p EGD.Marland Kitchen CHIEF COMPLAINT:   Chief Complaint  Patient presents with  . Abdominal Pain  . Chest Pain  . Leg Pain      . Review of Systems  Constitutional: Positive for malaise/fatigue. Negative for fever, chills and weight loss.  HENT: Negative for congestion.   Eyes: Negative for blurred vision and double vision.  Respiratory: Has some cough today. sputum production, shortness of breath and wheezing.   Cardiovascular: Negative for chest pain, palpitations, orthopnea, leg swelling and PND.  Gastrointestinal: Soft, bowel sounds present constipation and blood in stool.  Genitourinary: Negative for dysuria, urgency, frequency and hematuria.  Musculoskeletal: . Negative for falls.  Neurological: Negative for dizziness, tremors, focal weakness and headaches.  Endo/Heme/Allergies: Does not bruise/bleed easily.  Psychiatric/Behavioral: Negative for depression. The patient does not have insomnia.     VITAL SIGNS: Blood pressure 128/67, pulse 51, temperature 97.8 F (36.6 C), temperature source Oral, resp. rate 20, height 5\' 2"  (1.575 m), weight 97.07 kg (214 lb), SpO2 98 %.  PHYSICAL EXAMINATION:   GENERAL:  49 y.o.-year-old patient lying in the bed no distress chronically ill-appearing Dry oral mucosa EYES: Pupils equal, round, reactive to light and accommodation. Positive scleral icterus. Extraocular muscles intact.  HEENT: Head atraumatic, normocephalic. Oropharynx and nasopharynx clear.  NECK:  Supple, no jugular venous distention. No thyroid enlargement, no tenderness.  LUNGS: Some diminished breath sounds bilaterally, no wheezing, rales,rhonchi or crepitation. No use of accessory muscles of respiration.  CARDIOVASCULAR: S1, S2 normal. No murmurs, rubs, or gallops.  ABDOMEN: Soft , , distended Bowel sounds present. No  organomegaly or mass.  EXTREMITIES bilateral lateral extremity edema present  NEUROLOGIC: Cranial nerves II through XII are intact. Muscle strength 5/5 in all extremities. Sensation intact. Gait not checked.  PSYCHIATRIC: The patient is alert and oriented x 3.  SKIN: No obvious rash, lesion, or ulcer.   ORDERS/RESULTS REVIEWED:   CBC  Recent Labs Lab 11/12/15 0512 11/13/15 0616 11/14/15 0454 11/14/15 1235 11/17/15 0450  WBC 16.2* 18.4* 19.0* 16.2* 16.3*  HGB 9.3* 9.1* 9.9* 9.2* 9.4*  HCT 29.0* 27.6* 29.9* 28.0* 28.9*  PLT 89* 92* 111* 105* 127*  MCV 96.9 95.0 94.6 96.4 98.0  MCH 31.1 31.3 31.2 31.6 31.8  MCHC 32.1 32.9 33.0 32.8 32.4  RDW 27.2* 28.1* 28.0* 28.7* 28.4*   ------------------------------------------------------------------------------------------------------------------  Chemistries   Recent Labs Lab 11/12/15 0512 11/13/15 0616 11/13/15 1039 11/14/15 0454 11/15/15 0826 11/16/15 1224  NA 140 138 136  --  134* 131*  K 4.2 3.5 3.4*  --  3.6 3.2*  CL 112* 104 102  --  101 99*  CO2 22 25 26   --  24 24  GLUCOSE 87 88 89  --  129* 122*  BUN 39* 36* 38*  --  38* 30*  CREATININE UNABLE TO REPORT DUE TO SEVERE ICTERUS UNABLE TO REPORT DUE TO ICTERUS INTERFERENCE UNABLE TO REPORT DUE TO ICTERUS  --  UNABLE TO REPORT DUE TO ICTERUS SEE COMMENTS  CALCIUM 9.1 8.7* 8.5*  --  8.2* 7.9*  AST  --   --  90* 107*  --  146*  ALT  --   --  51 58*  --  77*  ALKPHOS  --   --  53 56  --  64  BILITOT  --   --  25.6* 26.7*  --  25.9*   ------------------------------------------------------------------------------------------------------------------ CrCl cannot be calculated (Patient has no serum creatinine result on file.). ------------------------------------------------------------------------------------------------------------------ No results for input(s): TSH, T4TOTAL, T3FREE, THYROIDAB in the last 72 hours.  Invalid input(s): FREET3  Cardiac Enzymes No results for  input(s): CKMB, TROPONINI, MYOGLOBIN in the last 168 hours.  Invalid input(s): CK ------------------------------------------------------------------------------------------------------------------ Invalid input(s): POCBNP ---------------------------------------------------------------------------------------------------------------  RADIOLOGY: No results found.  EKG:  Orders placed or performed during the hospital encounter of 11/06/15  . EKG 12-Lead  . EKG 12-Lead  . ED EKG within 10 minutes  . ED EKG within 10 minutes    ASSESSMENT AND PLAN:  1. Acute renal failure,  . Unable to accurately measure patient's renal function due to her bilirubin being high. Continue to monitor.Discontinued   IV bicarbonate drip secondary to a improved metabolic acidosis, start on IV albumin 25 g every 8 hours as per nephrology recommendation to see if it will improve the kidney function.  2. Right upper quadrant abdominal pain, due to alcoholic hepatitis versus alcoholic gastritis, continue Protonix twice daily  Coughing up blood;gi bleed/; started on octreotide drip continue for 5 days as per GI.Marland Kitchen  Hemoglobin is stable. Tomorrow is the last today for octreotide drip.   3. Hepatic encephalopathy; improvedimproved.  4. Gastrointestinal  bleed, s/p EGD.one .diffuse esophageal candidiasis, prob from steroid use, one prominent GE junction varix, that was banded, mild portal gastropathy, and gastritis Continue liquid diet,,start diflucan,d/c octreotide  drip   5. Acute posthemorrhagic anemia , status post transfusion hgb stable  6. Hyponatremia, Resolved with  rehydration   7.hypokalemia resolved   8. Coagulopathy , due to liver disease , that is post vitamin K   9. Liver cirrhosis , secondary to alcohol, hepatitis C; ultrasound failed to show any masses , severe portal hypertension was noted , patient has alcoholic hepatitis, hepatitis C causing liver cirrhosis: Discrimination factor is 33, started  on prednisolone 40 mg of prednisone. Discontinued the Rocephin for SBP prophylaxis. Very high 30 day mortality  Hyperbilirubinemia: Pain is still very high at May take long time to see improvement in jaundice.   #10.  DRUG ALLERGIES: No Known Allergies  CODE STATUS:     Code Status Orders        Start     Ordered   11/07/15 0025  Full code   Continuous     11/07/15 0024      Time spent 25 minutes  Leily Capek M.D on 11/18/2015 at 2:00 PM  Between 7am to 6pm - Pager - [phone]  After 6pm go to [email or web address] - password [email or web address] [facility]  [facility]  Office  [phone]  CC: Primary care physician; No PCP Per Patient

## 2015-11-18 NOTE — Anesthesia Postprocedure Evaluation (Signed)
Anesthesia Post Note  Patient: Abigail Cook  Procedure(s) Performed: Procedure(s) (LRB): ESOPHAGOGASTRODUODENOSCOPY (EGD) (Left)  Patient location during evaluation: Endoscopy Anesthesia Type: General Level of consciousness: awake and alert Pain management: pain level controlled Vital Signs Assessment: post-procedure vital signs reviewed and stable Respiratory status: spontaneous breathing, nonlabored ventilation, respiratory function stable and patient connected to nasal cannula oxygen Cardiovascular status: blood pressure returned to baseline and stable Postop Assessment: No signs of nausea or vomiting Anesthetic complications: no    Last Vitals:  Filed Vitals:   11/18/15 1218 11/18/15 1229  BP: 98/83 118/86  Pulse: 58 61  Temp:    Resp: 18 18    Last Pain:  Filed Vitals:   11/18/15 1236  PainSc: 0-No pain                 Precious Haws Sreekar Broyhill

## 2015-11-18 NOTE — Progress Notes (Signed)
[facility]  ROUNDING NOTE   Subjective:  Patient had EGD today. Esophageal candidiasis was noted. Creatinine still can be calculated. Total bilirubin still quite high at 25.9  Objective:  Vital signs in last 24 hours:  Temp:  [96.8 F (36 C)-99 F (37.2 C)] 97.8 F (36.6 C) (11/28 1345) Pulse Rate:  [51-63] 51 (11/28 1345) Resp:  [15-20] 20 (11/28 1345) BP: (98-128)/(59-86) 128/67 mmHg (11/28 1345) SpO2:  [97 %-100 %] 98 % (11/28 1345)  Weight change:  Filed Weights   11/06/15 1250  Weight: 97.07 kg (214 lb)    Intake/Output: I/O last 3 completed shifts: In: 2277 [P.O.:840; I.V.:747; IV Piggyback:690] Out: 750 [Urine:750]   Intake/Output this shift:  Total I/O In: 200 [I.V.:200] Out: -   Physical Exam: General: NAD  Head: Normocephalic, atraumatic. Moist oral mucosal membranes  Eyes: Icterus noted  Neck: Supple, trachea midline  Lungs:  Clear to auscultation normal effort  Heart: Regular rate and rhythm  Abdomen:  Soft, nontender, BS present  Extremities:  no peripheral edema.  Neurologic: Nonfocal, moving all four extremities  Skin: No lesions       Basic Metabolic Panel:  Recent Labs Lab 11/12/15 0512 11/13/15 0616 11/13/15 1039 11/15/15 0826 11/16/15 1224  NA 140 138 136 134* 131*  K 4.2 3.5 3.4* 3.6 3.2*  CL 112* 104 102 101 99*  CO2 22 25 26 24 24   GLUCOSE 87 88 89 129* 122*  BUN 39* 36* 38* 38* 30*  CREATININE UNABLE TO REPORT DUE TO SEVERE ICTERUS UNABLE TO REPORT DUE TO ICTERUS INTERFERENCE UNABLE TO REPORT DUE TO ICTERUS UNABLE TO REPORT DUE TO ICTERUS SEE COMMENTS  CALCIUM 9.1 8.7* 8.5* 8.2* 7.9*    Liver Function Tests:  Recent Labs Lab 11/13/15 1039 11/14/15 0454 11/16/15 1224  AST 90* 107* 146*  ALT 51 58* 77*  ALKPHOS 53 56 64  BILITOT 25.6* 26.7* 25.9*  PROT 6.1* 6.5 6.2*  ALBUMIN 1.5* 1.7* 1.6*   No results for input(s): LIPASE, AMYLASE in the last 168 hours. No results for input(s): AMMONIA in the last  168 hours.  CBC:  Recent Labs Lab 11/12/15 0512 11/13/15 0616 11/14/15 0454 11/14/15 1235 11/17/15 0450  WBC 16.2* 18.4* 19.0* 16.2* 16.3*  HGB 9.3* 9.1* 9.9* 9.2* 9.4*  HCT 29.0* 27.6* 29.9* 28.0* 28.9*  MCV 96.9 95.0 94.6 96.4 98.0  PLT 89* 92* 111* 105* 127*    Cardiac Enzymes: No results for input(s): CKTOTAL, CKMB, CKMBINDEX, TROPONINI in the last 168 hours.  BNP: Invalid input(s): POCBNP  CBG: No results for input(s): GLUCAP in the last 168 hours.  Microbiology: Results for orders placed or performed during the hospital encounter of 11/06/15  Culture, blood (routine x 2)     Status: None   Collection Time: 11/06/15  9:08 PM  Result Value Ref Range Status   Specimen Description BLOOD RIGHT HAND  Final   Special Requests BOTTLES DRAWN AEROBIC AND ANAEROBIC 5ML  Final   Culture NO GROWTH 7 DAYS  Final   Report Status 11/13/2015 FINAL  Final  Culture, blood (routine x 2)     Status: None   Collection Time: 11/06/15  9:09 PM  Result Value Ref Range Status   Specimen Description BLOOD RIGHT ANTECUBITAL  Final   Special Requests   Final    BOTTLES DRAWN AEROBIC AND ANAEROBIC 10MLANAEROBIC, 14MLAEROBIC   Culture NO GROWTH 7 DAYS  Final   Report Status 11/13/2015 FINAL  Final  Urine culture  Status: None   Collection Time: 11/07/15  1:30 AM  Result Value Ref Range Status   Specimen Description URINE, CATHETERIZED  Final   Special Requests NONE  Final   Culture INSIGNIFICANT GROWTH  Final   Report Status 11/08/2015 FINAL  Final  C difficile quick scan w PCR reflex     Status: None   Collection Time: 11/12/15  6:00 PM  Result Value Ref Range Status   C Diff antigen NEGATIVE NEGATIVE Final   C Diff toxin NEGATIVE NEGATIVE Final   C Diff interpretation Negative for C. difficile  Final    Coagulation Studies: No results for input(s): LABPROT, INR in the last 72 hours.  Urinalysis: No results for input(s): COLORURINE, LABSPEC, PHURINE, GLUCOSEU, HGBUR,  BILIRUBINUR, KETONESUR, PROTEINUR, UROBILINOGEN, NITRITE, LEUKOCYTESUR in the last 72 hours.  Invalid input(s): APPERANCEUR    Imaging: No results found.   Medications:   . octreotide  (SANDOSTATIN)    IV infusion 50 mcg/hr (11/18/15 1051)   . albumin human  25 g Intravenous Q8H  . fluconazole  100 mg Oral Daily  . folic acid  1 mg Oral Daily  . lactulose  10 g Oral BID  . multivitamin with minerals  1 tablet Oral Daily  . nadolol  20 mg Oral Daily  . pantoprazole (PROTONIX) IV  40 mg Intravenous Q12H  . prednisoLONE  40 mg Oral QAC breakfast  . sodium chloride  3 mL Intravenous Q12H  . thiamine  100 mg Oral Daily   morphine injection, ondansetron **OR** ondansetron (ZOFRAN) IV  Assessment/ Plan:  49 y.o. female with a PMHx of hypertension, seizure disorder,alcohol abuse, substance abuse (cocaine) who was admitted to [facility] on 11/06/2015 for evaluation of abdominal pain, malaise, and body aches.  1. Acute renal failure. 2. Hyponatremia. 3. Hypokalemia. - improved 4. Liver cirrhosis. 5. Hyperbilirubinemia. 6. Anemia unspecified with melena.  Plan: BUN still stable at 30.  Creatinine still unable to be calculated secondary to icterus.  Bilirubin remains high at 25.9.  Patient had EEG today which showed mild portal gastropathy as well as esophageal candidiasis.  At this point in time we will maintain the patient on albumin 25 g IV every 8 hours for another 24 hours.  We will consider stopping this tomorrow.  It has also been recommended that she stop octreotide and be transitioned to nadolol.   LOS: 12 Virlan Kempker 11/28/20162:07 PM

## 2015-11-18 NOTE — Anesthesia Preprocedure Evaluation (Signed)
Anesthesia Evaluation  Patient identified by MRN, date of birth, ID band Patient awake    Reviewed: Allergy & Precautions, NPO status , Patient's Chart, lab work & pertinent test results  Airway Mallampati: II       Dental  (+) Edentulous Lower, Partial Upper   Pulmonary COPD, former smoker,     + decreased breath sounds      Cardiovascular hypertension, Pt. on medications  Rhythm:Regular     Neuro/Psych Seizures -,     GI/Hepatic negative GI ROS, Neg liver ROS,   Endo/Other  negative endocrine ROS  Renal/GU negative Renal ROS     Musculoskeletal   Abdominal (+) + obese,   Peds  Hematology negative hematology ROS (+)   Anesthesia Other Findings   Reproductive/Obstetrics                             Anesthesia Physical Anesthesia Plan  ASA: III  Anesthesia Plan: General   Post-op Pain Management:    Induction: Intravenous  Airway Management Planned: Nasal Cannula  Additional Equipment:   Intra-op Plan:   Post-operative Plan:   Informed Consent: I have reviewed the patients History and Physical, chart, labs and discussed the procedure including the risks, benefits and alternatives for the proposed anesthesia with the patient or authorized representative who has indicated his/her understanding and acceptance.     Plan Discussed with: CRNA  Anesthesia Plan Comments:         Anesthesia Quick Evaluation

## 2015-11-18 NOTE — Op Note (Signed)
[facility] Gastroenterology Patient Name: Abigail Cook Procedure Date: 11/18/2015 11:35 AM MRN: [id number] Account #: [id number] Date of Birth: 02/05/1966 Admit Type: Inpatient Age: 49 Room: [facility] ENDO ROOM 4 Gender: Female Note Status: Finalized Procedure:         Upper GI endoscopy Indications:       Hematemesis, Melena, liver cirrhosis Providers:         Lupita Dawn. Candace Cruise, MD Medicines:         Monitored Anesthesia Care Complications:     No immediate complications. Procedure:         Pre-Anesthesia Assessment:                    - Prior to the procedure, a History and Physical was                     performed, and patient medications, allergies and                     sensitivities were reviewed. The patient's tolerance of                     previous anesthesia was reviewed.                    - The risks and benefits of the procedure and the sedation                     options and risks were discussed with the patient. All                     questions were answered and informed consent was obtained.                    - After reviewing the risks and benefits, the patient was                     deemed in satisfactory condition to undergo the procedure.                    After obtaining informed consent, the endoscope was passed                     under direct vision. Throughout the procedure, the                     patient's blood pressure, pulse, and oxygen saturations                     were monitored continuously. The Olympus GIF-140 endoscope                     (S#: [id number]) was introduced through the mouth, and                     advanced to the second part of duodenum. The upper GI                     endoscopy was accomplished without difficulty. The patient                     tolerated the procedure well. Findings:      Candidal esophagitis was found in the entire esophagus. Cells for       cytology were obtained by brushing.  Grade II  varices were found at the gastroesophageal junction. They were       medium in largest diameter. One band was successfully placed. There was       no bleeding at the end of the procedure.      The exam was otherwise without abnormality.      Localized mild inflammation characterized by erythema was found in the       gastric antrum. Biopsies were taken with a cold forceps for histology.      Mild portal hypertensive gastropathy was found in the gastric fundus.      The exam was otherwise without abnormality.      The examined duodenum was normal. Impression:        - Candidal candidiasis esophagitis. Cells for cytology                     obtained.                    - Grade II esophageal varices. Banded.                    - The examination was otherwise normal.                    - Gastritis. Biopsied.                    - Portal hypertensive gastropathy.                    - The examination was otherwise normal.                    - Normal examined duodenum. Recommendation:    - Discharge patient to home.                    - Observe patient's clinical course.                    - The findings and recommendations were discussed with the                     patient.                    - Liquid diet rest of today. Treat candidiasis. Procedure Code(s): --- Professional ---                    [phone], Esophagogastroduodenoscopy, flexible, transoral;                     with band ligation of esophageal/gastric varices                    43239, Esophagogastroduodenoscopy, flexible, transoral;                     with biopsy, single or multiple Diagnosis Code(s): --- Professional ---                    B37.81, Candidal esophagitis                    I85.00, Esophageal varices without bleeding                    K29.70, Gastritis, unspecified, without bleeding  K76.6, Portal hypertension                    K31.89, Other diseases of stomach and duodenum                     K92.0, Hematemesis                    K92.1, Melena CPT copyright 2014 [employer] Medical Association. All rights reserved. The codes documented in this report are preliminary and upon coder review may  be revised to meet current compliance requirements. Hulen Luster, MD 11/18/2015 11:56:48 AM This report has been signed electronically. Number of Addenda: 0 Note Initiated On: 11/18/2015 11:35 AM      [facility]

## 2015-11-18 NOTE — Transfer of Care (Signed)
Immediate Anesthesia Transfer of Care Note  Patient: Abigail Cook  Procedure(s) Performed: Procedure(s): ESOPHAGOGASTRODUODENOSCOPY (EGD) (Left)  Patient Location: PACU  Anesthesia Type:General  Level of Consciousness: oriented  Airway & Oxygen Therapy: Patient Spontanous Breathing and Patient connected to nasal cannula oxygen  Post-op Assessment: Report given to RN  Post vital signs: Reviewed and stable  Last Vitals:  Filed Vitals:   11/17/15 2118 11/18/15 0456  BP: 116/59 125/64  Pulse: 56 61  Temp: 37 C 37.2 C  Resp: 18 20    Complications: No apparent anesthesia complications

## 2015-11-18 NOTE — Progress Notes (Signed)
Palliative Medicine Inpatient Consult Follow Up Note   Name: Abigail Cook Date: 11/18/2015 MRN: [id number]  DOB: May 27, 1966  Referring Physician: Epifanio Lesches, MD  Palliative Care consult requested for this 49 y.o. female for goals of medical therapy in patient with advanced (end stage) alcoholic cirrhosis and acute renal failure.  Total bilirubin hasn't improved much since her admission on Nov 16th.  It was 26.7 and now is 25.9.  Creatinine still cannot be measured due to bilirubin interference with this lab determination.  Ammonis was last found to be 17, but her hepatic encephalopathy has decreased.    TODAY'S DISCUSSIONS AND DECISIONS 1.  Pt is Full Code per her wishes as stated during a discussion about this.   2.  She had previously been somewhat receptive to having Hospice come into her home, but not now.  She said, "I am not going to do the Hospice thing."    3.  She has gotten it into her mind that she is going to hire her own CAPs worker/ Aide/ Nurse --a specific person named "Lawrence Santiago" who she wants to be with her all day each day.  Says this person currently works all day with someone else and she wants to work things out so that pt can 'hire her for herself'.  Pt says that she was told by Iris that she first has to get a primary care doctor.  So---she plans on going directly to the [facility] the day she leaves here and getting herself a primary care doctor to sign the form that will allow her to 'hire Iris'.    Patient is actively planning what to do when she leaves.  She was told by me that she needs to talk with the care managers and social workers here because it is there job to make sure that she has the right kind of Home Health and assistance in her home when she does leave here.  Pt seems to want to do this on her own and has little appreciation for limitations in services by Home Health.  Perhaps she can qualify for a CAPS worker in her home, but this is  not an automatic thing just because she has Medicaid. Pt only wants this one individual working for her ---not anyone else. She first told me that 'Iris' was an Engineer, production. Then she said she was a 'nurse'. Then she told me she was a Engineer, agricultural.    4.  Pt is still considered to be quite eligible for Hospice services --since she has a grim prognosis for long term survival.  But, she declines this.  Also, she is more energetic and wanting to go home --perhaps it is time for her discharge to take place, but this decision is deferred to her attending physician.  She isn't going to go anywhere but home, and outpatient labs and appointments could be lined up for her.    5.  I have talked with the care manager and the social worker involved in patient's care plan and discharge plans.  Patient wishes to have aggressive care (and also wishes to go home [place] --with her own arrangements in mind). Will sign off.    ------------------------------------------------------------------------------------------------------------------ IMPRESSION: 1. Advanced Cirrhosis ---with total bilirubin (27..1 day of admission --only down to 25.9 now) ---on methylprednisolone ---with Hepatic Encephalopathy --improved somewhat with lactulose ---with abdominal pain possibly related to liver disease and/ or alcoholic gastritis  ---with GI bleed (EGD held today due to cocaine in  system) ---with coagulopathy (treated with Vit K) ---Ultrasound did not show masses but severe portal HTN is present 2. Hyponatremia --treated with rehydration 3. Hypokalemia --treated 4. Acute on chronic renal failure ----creatinine not calculable  5. Metabolic Acidosis 6. Chest Pain 7. Acute Blood Loss Anemia from GI bleed  ---transfused 8. Hepatitis C  --- Not a candidate for treatment at this time given her degree of liver and renal failure 9.  Esophageal Varices (grade 2 --with one banded) 10  Gastric Erythema 11.  Esophageal  Candidiasis ---I DO NOT SEE AN HIV SCREEN RESULT -------------------------------------------------------------------------------------------------------------   CODE STATUS: Full code  -per pts choice (it has been discussed during this admission)   PAST MEDICAL HISTORY: Past Medical History  Diagnosis Date  . Hypertension   . Seizures ([place])     PAST SURGICAL HISTORY:  Past Surgical History  Procedure Laterality Date  . Rod placed in right leg/foot      Vital Signs: BP 128/67 mmHg  Pulse 51  Temp(Src) 97.8 F (36.6 C) (Oral)  Resp 20  Ht 5\' 2"  (1.575 m)  Wt 97.07 kg (214 lb)  BMI 39.13 kg/m2  SpO2 98% Filed Weights   11/06/15 1250  Weight: 97.07 kg (214 lb)    Estimated body mass index is 39.13 kg/(m^2) as calculated from the following:   Height as of this encounter: 5\' 2"  (1.575 m).   Weight as of this encounter: 97.07 kg (214 lb).  PHYSICAL EXAM: Seen on the phone calling to request soup following her EGD procedure earlier today.  She tells me "they went down into my stomach and didn't find anything bad --everything looked fine"  (she had esoph candidiasis, portal HTN, gastric erythema, and grade 2 varices with one band placed by Dr. Verdie Shire ) EOMI OP clear Speech is a little indistinct No JVD or TM Hrt rrr no m Lungs cta ant ABd protuberant but with nl BS Ext no cyanosis or mottling  LABS: CBC:    Component Value Date/Time   WBC 16.3* 11/17/2015 0450   WBC 8.6 07/26/2014 0432   HGB 9.4* 11/17/2015 0450   HGB 9.1* 07/26/2014 0432   HCT 28.9* 11/17/2015 0450   HCT 28.5* 07/26/2014 0432   PLT 127* 11/17/2015 0450   PLT 134* 07/26/2014 0432   MCV 98.0 11/17/2015 0450   MCV 76* 07/26/2014 0432   NEUTROABS 5.7 07/26/2014 0432   LYMPHSABS 1.6 07/26/2014 0432   MONOABS 1.1* 07/26/2014 0432   EOSABS 0.2 07/26/2014 0432   BASOSABS 0.1 07/26/2014 0432   BASOSABS 1 07/22/2014 1931   Comprehensive Metabolic Panel:    Component Value Date/Time   NA 131*  11/16/2015 1224   NA 136 07/25/2014 1337   K 3.2* 11/16/2015 1224   K 3.6 07/25/2014 1337   CL 99* 11/16/2015 1224   CL 103 07/25/2014 1337   CO2 24 11/16/2015 1224   CO2 26 07/25/2014 1337   BUN 30* 11/16/2015 1224   BUN 7 07/25/2014 1337   CREATININE SEE COMMENTS 11/16/2015 1224   CREATININE 0.91 07/25/2014 1337   GLUCOSE 122* 11/16/2015 1224   GLUCOSE 121* 07/25/2014 1337   CALCIUM 7.9* 11/16/2015 1224   CALCIUM 8.7 07/25/2014 1337   AST 146* 11/16/2015 1224   AST 67* 07/22/2014 1931   ALT 77* 11/16/2015 1224   ALT 38 07/22/2014 1931   ALKPHOS 64 11/16/2015 1224   ALKPHOS 60 07/22/2014 1931   BILITOT 25.9* 11/16/2015 1224   BILITOT 0.5 07/22/2014 1931  PROT 6.2* 11/16/2015 1224   PROT 8.7* 07/22/2014 1931   ALBUMIN 1.6* 11/16/2015 1224   ALBUMIN 3.0* 07/22/2014 1931    More than 50% of the visit was spent in counseling/coordination of care: YES  Time Spent:  35 min

## 2015-11-18 NOTE — Progress Notes (Addendum)
Physical Therapy Treatment Patient Details Name: Abigail Cook MRN: [id number] DOB: 1966/10/24 Today's Date: 11/18/2015    History of Present Illness Patient presents to [facility] with R UQ pain as well as generalized body aches. Patient noted to be in ARF, hyperbilirubinemia. During this admission she has also complained of chest pain.     PT Comments    Pt is making good progress this date with increased ambulation distance this session. Pt went for EGD this date. Pt still very impulsive and requires cues for sequencing and safety. This includes using AD during ambulation for balance correction. Pt is getting closer to being independent. Exercises deferred secondary to dinner tray arriving.  Follow Up Recommendations  Home health PT;Supervision - Intermittent     Equipment Recommendations      Recommendations for Other Services       Precautions / Restrictions Precautions Precautions: Fall Restrictions Weight Bearing Restrictions: No    Mobility  Bed Mobility Overal bed mobility: Modified Independent Bed Mobility: Supine to Sit           General bed mobility comments: Mod I performed for bed mobility. Safe technique performed.  Transfers Overall transfer level: Modified independent Equipment used: None Transfers: Sit to/from Stand Sit to Stand: Modified independent (Device/Increase time)         General transfer comment: Pt able to perform transfers with Mod I, however pt very impulsive and requires cues to use AD correctly  Ambulation/Gait Ambulation/Gait assistance: Min guard Ambulation Distance (Feet): 150 Feet Assistive device: Rolling walker (2 wheeled) Gait Pattern/deviations: Step-through pattern     General Gait Details: ambulated using cga with safe technique. Cues given for reciprocal gait pattern and to stay closer to rw. Pt fatigues with increased ambulation distance and needs cues to turn back towards room.   Stairs             Wheelchair Mobility    Modified Rankin (Stroke Patients Only)       Balance                                    Cognition Arousal/Alertness: Awake/alert Behavior During Therapy: Anxious;WFL for tasks assessed/performed Overall Cognitive Status: Within Functional Limits for tasks assessed                      Exercises Other Exercises Other Exercises: Pt assisted to [facility] with supervision and cues for safety as pt needs help placing BSC against wall so it does not slide. Independence noted with hygiene    General Comments        Pertinent Vitals/Pain Pain Assessment: No/denies pain    Home Living                      Prior Function            PT Goals (current goals can now be found in the care plan section) Acute Rehab PT Goals Patient Stated Goal: To return home  PT Goal Formulation: With patient Time For Goal Achievement: 11/25/15 Potential to Achieve Goals: Good Progress towards PT goals: Progressing toward goals    Frequency  Min 2X/week    PT Plan Current plan remains appropriate    Co-evaluation             End of Session Equipment Utilized During Treatment: Gait belt Activity Tolerance: Patient limited by fatigue Patient left:  with bed alarm set (sitting on EOB eating dinner)     Time: [id number] PT Time Calculation (min) (ACUTE ONLY): 13 min  Charges:  $Gait Training: 8-22 mins                    G Codes:      Dawnn Nam 12/12/2015, 5:13 PM  Greggory Stallion, PT, DPT [phone]

## 2015-11-18 NOTE — Op Note (Signed)
EGD showed diffuse esophageal candidiasis, prob from steroid use, one prominent GE junction varix, that was banded, mild portal gastropathy, and gastritis. No evidence of bleeding now. Stay on liquid diet rest of today. Soft diet starting tomorrow. Once octreotide tapered off, start low dose nadolol daily. Treat candidiasis with either diflucan daily or oral antifungal solution. Thanks.

## 2015-11-18 NOTE — Progress Notes (Signed)
Nutrition Follow-up       INTERVENTION:  Meals and snacks: Cater to pt preferences Nutrition Supplement Therapy: continue supplements for added nutrition Coordination of care: pt meeting nutritional goals, no further intervention warranted at this time.  Please re-consult RD if more aggressive nutrition therapy is wanted.    NUTRITION DIAGNOSIS:   Inadequate oral intake related to acute illness as evidenced by per patient/family report.    GOAL:   Patient will meet greater than or equal to 90% of their needs  Meeting goals  MONITOR:    (Energy Intake, Anthropometrics, Digestive System, Electrolyte/Renal Profile)  REASON FOR ASSESSMENT:   Diagnosis    ASSESSMENT:      Pt s/p EGD this am showing candidasis and varix at GE juction   Current Nutrition: noting pt eating 75-100% of meals during admission   Gastrointestinal Profile: Last BM: 11/27   Scheduled Medications:  . albumin human  25 g Intravenous Q8H  . fluconazole  100 mg Oral Daily  . folic acid  1 mg Oral Daily  . lactulose  10 g Oral BID  . multivitamin with minerals  1 tablet Oral Daily  . nadolol  20 mg Oral Daily  . pantoprazole (PROTONIX) IV  40 mg Intravenous Q12H  . prednisoLONE  40 mg Oral QAC breakfast  . sodium chloride  3 mL Intravenous Q12H  . thiamine  100 mg Oral Daily    Continuous Medications:  . octreotide  (SANDOSTATIN)    IV infusion 50 mcg/hr (11/18/15 1051)     Electrolyte/Renal Profile and Glucose Profile:   Recent Labs Lab 11/13/15 1039 11/15/15 0826 11/16/15 1224  NA 136 134* 131*  K 3.4* 3.6 3.2*  CL 102 101 99*  CO2 26 24 24   BUN 38* 38* 30*  CREATININE UNABLE TO REPORT DUE TO ICTERUS UNABLE TO REPORT DUE TO ICTERUS SEE COMMENTS  CALCIUM 8.5* 8.2* 7.9*  GLUCOSE 89 129* 122*   Protein Profile:  Recent Labs Lab 11/13/15 1039 11/14/15 0454 11/16/15 1224  ALBUMIN 1.5* 1.7* 1.6*       Weight Trend since Admission: Filed Weights   11/06/15 1250   Weight: 214 lb (97.07 kg)      Diet Order:  Diet full liquid Room service appropriate?: Yes; Fluid consistency:: Thin  Skin:   (jaundice, no wounds)   Height:   Ht Readings from Last 1 Encounters:  11/06/15 5\' 2"  (1.575 m)    Weight:   Wt Readings from Last 1 Encounters:  11/06/15 214 lb (97.07 kg)    Ideal Body Weight:     BMI:  Body mass index is 39.13 kg/(m^2).  Estimated Nutritional Needs:   Kcal:  1500-1750 kcals   Protein:  50-60 g (1.0-1.2 gkg)   Fluid:  1250-1500 mL   EDUCATION NEEDS:   No education needs identified at this time  Ellee Wawrzyniak B. Zenia Resides, [place], [place] (pager)

## 2015-11-19 DIAGNOSIS — L8990 Pressure ulcer of unspecified site, unspecified stage: Secondary | ICD-10-CM | POA: Insufficient documentation

## 2015-11-19 LAB — SURGICAL PATHOLOGY

## 2015-11-19 LAB — BASIC METABOLIC PANEL
ANION GAP: 9 (ref 5–15)
BUN: 26 mg/dL — ABNORMAL HIGH (ref 6–20)
CALCIUM: 8.2 mg/dL — AB (ref 8.9–10.3)
CHLORIDE: 106 mmol/L (ref 101–111)
CO2: 23 mmol/L (ref 22–32)
CREATININE: UNDETERMINED mg/dL (ref 0.44–1.00)
Glucose, Bld: 125 mg/dL — ABNORMAL HIGH (ref 65–99)
Potassium: 3.2 mmol/L — ABNORMAL LOW (ref 3.5–5.1)
SODIUM: 138 mmol/L (ref 135–145)

## 2015-11-19 LAB — CBC
HCT: 26.3 % — ABNORMAL LOW (ref 35.0–47.0)
HEMOGLOBIN: 8.6 g/dL — AB (ref 12.0–16.0)
MCH: 32.6 pg (ref 26.0–34.0)
MCHC: 32.8 g/dL (ref 32.0–36.0)
MCV: 99.2 fL (ref 80.0–100.0)
PLATELETS: 118 10*3/uL — AB (ref 150–440)
RBC: 2.65 MIL/uL — AB (ref 3.80–5.20)
RDW: 28.3 % — ABNORMAL HIGH (ref 11.5–14.5)
WBC: 14.9 10*3/uL — AB (ref 3.6–11.0)

## 2015-11-19 MED ORDER — ALBUTEROL SULFATE (2.5 MG/3ML) 0.083% IN NEBU
2.5000 mg | INHALATION_SOLUTION | Freq: Four times a day (QID) | RESPIRATORY_TRACT | Status: DC
  Administered 2015-11-19 – 2015-11-20 (×4): 2.5 mg via RESPIRATORY_TRACT
  Filled 2015-11-19 (×5): qty 3

## 2015-11-19 MED ORDER — POTASSIUM CHLORIDE 10 MEQ/100ML IV SOLN
10.0000 meq | INTRAVENOUS | Status: AC
  Administered 2015-11-19 (×4): 10 meq via INTRAVENOUS
  Filled 2015-11-19 (×4): qty 100

## 2015-11-19 MED ORDER — ALBUTEROL SULFATE HFA 108 (90 BASE) MCG/ACT IN AERS: 2.0000 | INHALATION_SPRAY | Freq: Four times a day (QID) | RESPIRATORY_TRACT | Status: DC

## 2015-11-19 NOTE — Progress Notes (Signed)
[place] [facility] [place] NAME: Abigail Cook    MR#:  [id number]  DATE OF BIRTH:  [birth year]  SUBJECTIVE; s/p EGD.Marland Kitchen Patient requesting regular diet. She also requesting a albuterol for wheezing. Denies abdominal pain. No nausea no vomiting. No shortness of breath. Does have some cough. CHIEF COMPLAINT:   Chief Complaint  Patient presents with  . Abdominal Pain  . Chest Pain  . Leg Pain      . Review of Systems  Constitution negative for fever, chills and weight loss.  HENT: Negative for congestion.   Eyes: Negative for blurred vision and double vision.  Respiratory: Has some cough today. sputum production, shortness of breath and wheezing.   Cardiovascular: Negative for chest pain, palpitations, orthopnea, leg swelling and PND.  Gastrointestinal: Soft, bowel sounds present constipation and blood in stool.  Genitourinary: Negative for dysuria, urgency, frequency and hematuria.  Musculoskeletal: . Negative for falls.  Neurological: Negative for dizziness, tremors, focal weakness and headaches.  Endo/Heme/Allergies: Does not bruise/bleed easily.  Psychiatric/Behavioral: Negative for depression. The patient does not have insomnia.     VITAL SIGNS: Blood pressure 110/47, pulse 57, temperature 99.6 F (37.6 C), temperature source Oral, resp. rate 15, height 5\' 2"  (1.575 m), weight 97.07 kg (214 lb), SpO2 99 %.  PHYSICAL EXAMINATION:   GENERAL:  49 y.o.-year-old patient lying in the bed no distress chronically ill-appearing Dry oral mucosa EYES: Pupils equal, round, reactive to light and accommodation. Positive scleral icterus. Extraocular muscles intact.  HEENT: Head atraumatic, normocephalic. Oropharynx and nasopharynx clear.  NECK:  Supple, no jugular venous distention. No thyroid enlargement, no tenderness.  LUNGS: End expiratory wheeze in the right lung fields. No use of accessory muscles of respiration.  CARDIOVASCULAR: S1, S2  normal. No murmurs, rubs, or gallops.  ABDOMEN: Soft , , distended Bowel sounds present. No organomegaly or mass.  EXTREMITIES bilateral lateral extremity edema present  NEUROLOGIC: Cranial nerves II through XII are intact. Muscle strength 5/5 in all extremities. Sensation intact. Gait not checked.  PSYCHIATRIC: The patient is alert and oriented x 3.  SKIN: No obvious rash, lesion, or ulcer.   ORDERS/RESULTS REVIEWED:   CBC  Recent Labs Lab 11/13/15 0616 11/14/15 0454 11/14/15 1235 11/17/15 0450 11/19/15 0515  WBC 18.4* 19.0* 16.2* 16.3* 14.9*  HGB 9.1* 9.9* 9.2* 9.4* 8.6*  HCT 27.6* 29.9* 28.0* 28.9* 26.3*  PLT 92* 111* 105* 127* 118*  MCV 95.0 94.6 96.4 98.0 99.2  MCH 31.3 31.2 31.6 31.8 32.6  MCHC 32.9 33.0 32.8 32.4 32.8  RDW 28.1* 28.0* 28.7* 28.4* 28.3*   ------------------------------------------------------------------------------------------------------------------  Chemistries   Recent Labs Lab 11/13/15 0616 11/13/15 1039 11/14/15 0454 11/15/15 0826 11/16/15 1224 11/19/15 0515  NA 138 136  --  134* 131* 138  K 3.5 3.4*  --  3.6 3.2* 3.2*  CL 104 102  --  101 99* 106  CO2 25 26  --  24 24 23   GLUCOSE 88 89  --  129* 122* 125*  BUN 36* 38*  --  38* 30* 26*  CREATININE UNABLE TO REPORT DUE TO ICTERUS INTERFERENCE UNABLE TO REPORT DUE TO ICTERUS  --  UNABLE TO REPORT DUE TO ICTERUS SEE COMMENTS UNABLE TO REPORT DUE TO ICTERUS  CALCIUM 8.7* 8.5*  --  8.2* 7.9* 8.2*  AST  --  90* 107*  --  146*  --   ALT  --  51 58*  --  77*  --  ALKPHOS  --  53 56  --  64  --   BILITOT  --  25.6* 26.7*  --  25.9*  --    ------------------------------------------------------------------------------------------------------------------ CrCl cannot be calculated (Patient has no serum creatinine result on file.). ------------------------------------------------------------------------------------------------------------------ No results for input(s): TSH, T4TOTAL, T3FREE,  THYROIDAB in the last 72 hours.  Invalid input(s): FREET3  Cardiac Enzymes No results for input(s): CKMB, TROPONINI, MYOGLOBIN in the last 168 hours.  Invalid input(s): CK ------------------------------------------------------------------------------------------------------------------ Invalid input(s): POCBNP ---------------------------------------------------------------------------------------------------------------  RADIOLOGY: No results found.  EKG:  Orders placed or performed during the hospital encounter of 11/06/15  . EKG 12-Lead  . EKG 12-Lead  . ED EKG within 10 minutes  . ED EKG within 10 minutes    ASSESSMENT AND PLAN:  1. Acute renal failure,  . Unable to accurately measure patient's renal function due to her bilirubin being high. Continue to monitor.Discontinued   IV bicarbonate drip secondary to a improved metabolic acidosis, start on IV albumin 25 g every 8 hours as per nephrology recommendation to see if it will improve the kidney function.  2. Right upper quadrant abdominal pain, due to alcoholic hepatitis versus alcoholic gastritis, continue Protonix twice daily  Finished 5 days of octreotide drip.  3. Hepatic encephalopathy; improvedimproved.  4. Gastrointestinal  bleed, s/p EGD.one .diffuse esophageal candidiasis, prob from steroid use, one prominent GE junction varix, that was banded, mild portal gastropathy, and gastritis  advance to soft diet. start diflucan,d/c octreotide  drip   5. Acute posthemorrhagic anemia , status post transfusion hgb stable  6. Hyponatremia, Resolved with  rehydration   7.hypokalemia resolved   8. Coagulopathy , due to liver disease , that is post vitamin K   9. Liver cirrhosis , secondary to alcohol, hepatitis C; ultrasound failed to show any masses , severe portal hypertension was noted , patient has alcoholic hepatitis, hepatitis C causing liver cirrhosis: Discrimination factor is 33, started on prednisolone 40 mg of  prednisone. Discontinued the Rocephin for SBP prophylaxis. Very high 30 day mortality and refused hospice services. Appreciate palliative care input. Likely discharge tomorrow. Patient agreed for it. Hyperbilirubinemia: Pain is still very high at May take long time to see improvement in jaundice.   #10.  DRUG ALLERGIES: No Known Allergies  CODE STATUS:     Code Status Orders        Start     Ordered   11/07/15 0025  Full code   Continuous     11/07/15 0024      Time spent 84 minutes  Francis Doenges M.D on 11/19/2015 at 9:29 AM  Between 7am to 6pm - Pager - [phone]  After 6pm go to [email or web address] - password [email or web address] [facility]  [facility]  Office  [phone]  CC: Primary care physician; No PCP Per Patient

## 2015-11-19 NOTE — Progress Notes (Signed)
[facility]  ROUNDING NOTE   Subjective:  Patient resting comfortably in bed. BUN currently down to 26. Creatinine still can't be calculated. Albumin remains low at 1.6.  Objective:  Vital signs in last 24 hours:  Temp:  [96.8 F (36 C)-99.6 F (37.6 C)] 99.6 F (37.6 C) (11/29 0443) Pulse Rate:  [49-63] 57 (11/29 0443) Resp:  [15-20] 15 (11/29 0443) BP: (98-147)/(47-86) 110/47 mmHg (11/29 0443) SpO2:  [97 %-100 %] 99 % (11/29 0443)  Weight change:  Filed Weights   11/06/15 1250  Weight: 97.07 kg (214 lb)    Intake/Output: I/O last 3 completed shifts: In: 1431.8 [P.O.:240; I.V.:1095.8; IV Piggyback:96] Out: 550 [Urine:550]   Intake/Output this shift:  Total I/O In: 223 [I.V.:223] Out: 300 [Urine:300]  Physical Exam: General: NAD  Head: Normocephalic, atraumatic. Moist oral mucosal membranes  Eyes: Icterus noted  Neck: Supple, trachea midline  Lungs:  Clear to auscultation normal effort  Heart: Regular rate and rhythm  Abdomen:  Soft, nontender, BS present  Extremities:  no peripheral edema.  Neurologic: Nonfocal, moving all four extremities  Skin: No lesions       Basic Metabolic Panel:  Recent Labs Lab 11/13/15 0616 11/13/15 1039 11/15/15 0826 11/16/15 1224 11/19/15 0515  NA 138 136 134* 131* 138  K 3.5 3.4* 3.6 3.2* 3.2*  CL 104 102 101 99* 106  CO2 25 26 24 24 23   GLUCOSE 88 89 129* 122* 125*  BUN 36* 38* 38* 30* 26*  CREATININE UNABLE TO REPORT DUE TO ICTERUS INTERFERENCE UNABLE TO REPORT DUE TO ICTERUS UNABLE TO REPORT DUE TO ICTERUS SEE COMMENTS UNABLE TO REPORT DUE TO ICTERUS  CALCIUM 8.7* 8.5* 8.2* 7.9* 8.2*    Liver Function Tests:  Recent Labs Lab 11/13/15 1039 11/14/15 0454 11/16/15 1224  AST 90* 107* 146*  ALT 51 58* 77*  ALKPHOS 53 56 64  BILITOT 25.6* 26.7* 25.9*  PROT 6.1* 6.5 6.2*  ALBUMIN 1.5* 1.7* 1.6*   No results for input(s): LIPASE, AMYLASE in the last 168 hours. No results for input(s): AMMONIA in  the last 168 hours.  CBC:  Recent Labs Lab 11/13/15 0616 11/14/15 0454 11/14/15 1235 11/17/15 0450 11/19/15 0515  WBC 18.4* 19.0* 16.2* 16.3* 14.9*  HGB 9.1* 9.9* 9.2* 9.4* 8.6*  HCT 27.6* 29.9* 28.0* 28.9* 26.3*  MCV 95.0 94.6 96.4 98.0 99.2  PLT 92* 111* 105* 127* 118*    Cardiac Enzymes: No results for input(s): CKTOTAL, CKMB, CKMBINDEX, TROPONINI in the last 168 hours.  BNP: Invalid input(s): POCBNP  CBG: No results for input(s): GLUCAP in the last 168 hours.  Microbiology: Results for orders placed or performed during the hospital encounter of 11/06/15  Culture, blood (routine x 2)     Status: None   Collection Time: 11/06/15  9:08 PM  Result Value Ref Range Status   Specimen Description BLOOD RIGHT HAND  Final   Special Requests BOTTLES DRAWN AEROBIC AND ANAEROBIC 5ML  Final   Culture NO GROWTH 7 DAYS  Final   Report Status 11/13/2015 FINAL  Final  Culture, blood (routine x 2)     Status: None   Collection Time: 11/06/15  9:09 PM  Result Value Ref Range Status   Specimen Description BLOOD RIGHT ANTECUBITAL  Final   Special Requests   Final    BOTTLES DRAWN AEROBIC AND ANAEROBIC 10MLANAEROBIC, 14MLAEROBIC   Culture NO GROWTH 7 DAYS  Final   Report Status 11/13/2015 FINAL  Final  Urine culture  Status: None   Collection Time: 11/07/15  1:30 AM  Result Value Ref Range Status   Specimen Description URINE, CATHETERIZED  Final   Special Requests NONE  Final   Culture INSIGNIFICANT GROWTH  Final   Report Status 11/08/2015 FINAL  Final  C difficile quick scan w PCR reflex     Status: None   Collection Time: 11/12/15  6:00 PM  Result Value Ref Range Status   C Diff antigen NEGATIVE NEGATIVE Final   C Diff toxin NEGATIVE NEGATIVE Final   C Diff interpretation Negative for C. difficile  Final    Coagulation Studies: No results for input(s): LABPROT, INR in the last 72 hours.  Urinalysis: No results for input(s): COLORURINE, LABSPEC, PHURINE, GLUCOSEU,  HGBUR, BILIRUBINUR, KETONESUR, PROTEINUR, UROBILINOGEN, NITRITE, LEUKOCYTESUR in the last 72 hours.  Invalid input(s): APPERANCEUR    Imaging: No results found.   Medications:   . octreotide  (SANDOSTATIN)    IV infusion 25 mcg/hr (11/18/15 2230)   . albumin human  25 g Intravenous Q8H  . fluconazole  100 mg Oral Daily  . folic acid  1 mg Oral Daily  . lactulose  10 g Oral BID  . multivitamin with minerals  1 tablet Oral Daily  . nadolol  20 mg Oral Daily  . pantoprazole (PROTONIX) IV  40 mg Intravenous Q12H  . prednisoLONE  40 mg Oral QAC breakfast  . sodium chloride  3 mL Intravenous Q12H  . thiamine  100 mg Oral Daily   morphine injection, ondansetron **OR** ondansetron (ZOFRAN) IV  Assessment/ Plan:  49 y.o. female with a PMHx of hypertension, seizure disorder,alcohol abuse, substance abuse (cocaine) who was admitted to [facility] on 11/06/2015 for evaluation of abdominal pain, malaise, and body aches.  1. Acute renal failure. 2. Hyponatremia. 3. Hypokalemia. - improved 4. Liver cirrhosis. 5. Hyperbilirubinemia. 6. Anemia unspecified with melena.  Plan: As before creatinine cannot be Related secondary to hyperbilirubinemia and icterus. However BUN continues to trend down and is currently 26. Therefore we will continue the patient on albumin 25 g IV every 8 hours for now. Serum potassium noted to be low at 3.2. We will administer potassium chloride 40 mEq IV today. EGD results were noted. Patient remains on pantoprazole for this issue. Otherwise liver cirrhosis remains a big issue which is causing significant morbidity.   LOS: 13 Abigail Cook 11/29/20167:31 AM

## 2015-11-20 ENCOUNTER — Encounter: Payer: Self-pay | Admitting: Gastroenterology

## 2015-11-20 LAB — BASIC METABOLIC PANEL
ANION GAP: 10 (ref 5–15)
BUN: 22 mg/dL — ABNORMAL HIGH (ref 6–20)
CALCIUM: 8.7 mg/dL — AB (ref 8.9–10.3)
CO2: 22 mmol/L (ref 22–32)
Chloride: 106 mmol/L (ref 101–111)
Creatinine, Ser: UNDETERMINED mg/dL (ref 0.44–1.00)
Glucose, Bld: 128 mg/dL — ABNORMAL HIGH (ref 65–99)
POTASSIUM: 3.6 mmol/L (ref 3.5–5.1)
Sodium: 138 mmol/L (ref 135–145)

## 2015-11-20 MED ORDER — PANTOPRAZOLE SODIUM 40 MG PO TBEC: 40.0000 mg | DELAYED_RELEASE_TABLET | Freq: Every day | ORAL | Status: DC

## 2015-11-20 MED ORDER — LACTULOSE 10 GM/15ML PO SOLN: 10.0000 g | Freq: Two times a day (BID) | ORAL | Status: DC

## 2015-11-20 MED ORDER — FLUCONAZOLE 100 MG PO TABS: 100.0000 mg | ORAL_TABLET | Freq: Every day | ORAL | Status: DC

## 2015-11-20 MED ORDER — NADOLOL 20 MG PO TABS: 20.0000 mg | ORAL_TABLET | Freq: Every day | ORAL | Status: DC

## 2015-11-20 MED ORDER — PREDNISOLONE 15 MG/5ML PO SOLN
40.0000 mg | Freq: Every day | ORAL | Status: DC
Start: 2015-11-20 — End: 2015-12-02

## 2015-11-20 NOTE — Progress Notes (Signed)
Pt to be discharged per MD order. All instructions given to pt and all questions answered. Scripts were submitted electronically. Pt is to be set up with home health once pt has a primary MD once [facility] sets pt up he will call pt with [facility] aide via [place]. Pt is ok with this and will be taken out in wheelchair.

## 2015-11-20 NOTE — Care Management (Signed)
Contacted Kara at [facility] concerning patient situation and she stated that patient would be appropriate for their service. Spoke with patient who would be agreeable to switch to [facility] instead of [facility]. Informed Floydene Flock that pateint had elected to go with [facility]. Patient will need PCP as her Medicaid PCP is no longer in practice and patient has not been assigned a new one yet.

## 2015-11-20 NOTE — Discharge Summary (Signed)
Abigail Cook, is a 49 y.o. female  DOB January 05, 1966  MRN [id number].  Admission date:  11/06/2015  Admitting Physician  Theodoro Grist, MD  Discharge Date:  11/20/2015   Primary MD  No PCP Per Patient  Recommendations for primary care physician for things to follow:   Follow-up with GI Dr. Candace Cruise in 1-2 weeks.  Admission Diagnosis  Hepatic encephalopathy (HCC) [K72.90] Hyperbilirubinemia [E80.6] Acute renal failure, unspecified acute renal failure type ([place]) [N17.9]   Discharge Diagnosis  Hepatic encephalopathy ([place]) [K72.90] Hyperbilirubinemia [E80.6] Acute renal failure, unspecified acute renal failure type ([place]) [N17.9]   Active Problems:   Hepatic encephalopathy (HCC)   Alcoholic cirrhosis of liver with ascites (HCC)   Hyperbilirubinemia   Pressure ulcer      Past Medical History  Diagnosis Date  . Hypertension   . Seizures [facility])     Past Surgical History  Procedure Laterality Date  . Rod placed in right leg/foot    . Esophagogastroduodenoscopy Left 11/18/2015    Procedure: ESOPHAGOGASTRODUODENOSCOPY (EGD);  Surgeon: Hulen Luster, MD;  Location: [facility] END[facility]COPY;  Service: Endoscopy;  Laterality: Left;       History of present illness and  Hospital Course:     Kindly see H&P for history of present illness and admission details, please review complete Labs, Consult reports and Test reports for all details in brief  HPI  from the history and physical done on the day of admission 49 year old female patient with hypertension, alcoholic liver cirrhosis comes in with abdominal pain, lethargy. Also noted to have nausea, vomiting with abdominal pain.   Hospital Course   acute renal failure with ATN from ibuprofen and naproxen: Started on IV hydration, nephrology consult obtained. In by Dr.Munsorr Lateef, Creatinine  more than 6, potassium 2.9, sodium 132. Acute renal failure thought to be multifactorial due to and SCDs, advanced liver disease. Patient received IV bicarbonate drip, monitored closely by nephrology. Never required dialysis. And also received albumin 25 g IV every 8 hours from November 26. Unable to calculate creatinine secondary to severe hyperbilirubinemia. #2 right upper quadrant pain nausea vomiting in a patient with heavy alcohol abuse, drinks about 12 pack of beers weekly and cocaine use: Patient has severe jaundice with bilirubin of 25. Patient Seen by gastroenterology Dr. Candace Cruise, started on Protonix.   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 Discharge home with prednisone 40 mg daily for 28 days and the patient is to see GI for slow tapering off to that. Patient has high risk for short-term mortality referred to hospice care by palliative care team but patient refused. #4 leukocytosis due to steroids, negative C. difficile study. Hepatic encephalopathy secondary to liver cirrhosis: Started on left lactulose 4 times a day, ammonia initially 146 improved to 59.   #5 jaundice secondary to alcohol abuse or cirrhosis, hepatitis C: L still; has  25.9.bilirubin     6 abdominal pain with history of liver cirrhosis. Patient was followed with the gastroenterology, urine toxicology was repeated on 26, NOv, cocaine was negative that time patient was taken to EGD on 27 the [place], EGD showed diffuse esophageal candidiasis, varix at the GE junction which was banded, mild portal gastropathy, gastritis: Patient discharged home with Diflucan, PPIs, nadolol.  Discharge Condition: stable   Follow UP   follow up with DR.OH in  2 weeks   Discharge Instructions  and  Discharge Medications   D/c home with home health RN<SW>R  Discharge Instructions    Face-to-face encounter (required for Medicare/Medicaid patients)    Complete by:  As directed    I Epifanio Lesches certify that this patient is under my care and that I, or a nurse practitioner or physician's assistant working with me, had a face-to-face encounter that meets the physician face-to-face encounter requirements with this patient on 11/20/2015. The encounter with the patient was in whole, or in part for the following medical condition(s) which is the primary reason for home health care Alcoholic cirrhosis  portal htn Renal faiure jaundice  The encounter with the patient was in whole, or in part, for the following medical condition, which is the primary reason for home health care:  whole  I certify that, based on my findings, the following services are medically necessary home health services:  Nursing  Reason for Medically Necessary Home Health Services:  Skilled Nursing- Skilled Assessment/Observation  My clinical findings support the need for the above services:  OTHER SEE COMMENTS  Further, I certify that my clinical findings support that this patient is homebound due to:  Mental confusion     Home Health    Complete by:  As directed   To provide the following care/treatments:   [place] work              Medication List    STOP taking these medications        naproxen 500 MG tablet  Commonly known as:  NAPROSYN      TAKE these medications        fluconazole 100 MG tablet  Commonly known as:  DIFLUCAN  Take 1 tablet (100 mg total) by mouth daily.     lactulose 10 GM/15ML solution  Commonly known as:  CHRONULAC  Take 15 mLs (10 g total) by mouth 2 (two) times daily.     nadolol 20 MG tablet  Commonly known as:  CORGARD  Take 1 tablet (20 mg total) by mouth daily.     pantoprazole 40 MG tablet  Commonly known as:  PROTONIX  Take 1 tablet (40 mg total) by mouth daily.     prednisoLONE 15 MG/5ML Soln  Commonly known as:  PRELONE  Take 13.3 mLs (40 mg total) by mouth daily before breakfast.          Diet and Activity  recommendation: See Discharge Instructions above   Consults obtained - GI, nephrology, palliative care   Major procedures and Radiology Reports - PLEASE review detailed and final reports for all details, in brief -     Ct Abdomen Pelvis Wo Contrast  11/06/2015  CLINICAL DATA:  Acute onset of generalized abdominal pain and right leg pain. Nausea and vomiting. Jaundice. Initial encounter. EXAM: CT ABDOMEN AND PELVIS WITHOUT CONTRAST TECHNIQUE: Multidetector CT imaging of the abdomen and pelvis was performed following the standard protocol without IV contrast. COMPARISON:  Abdominal ultrasound performed 07/25/2014 FINDINGS: Minimal bibasilar opacities demonstrate associated bronchiectasis and may reflect mild scarring or possibly chronic sequelae of infection. Scattered coronary artery calcifications are seen. There is a diffusely nodular contour to the liver, with diffusely decreased attenuation, compatible with hepatic cirrhosis and likely regenerative and degenerative nodules. No definite dominant mass is characterized. The spleen is unremarkable in appearance. Stones are seen dependently within the gallbladder. The gallbladder is mildly distended but otherwise unremarkable. The pancreas and adrenal glands are within normal limits. The kidneys are unremarkable in appearance. There is no evidence of hydronephrosis. No renal or ureteral  stones are seen. Mild nonspecific perinephric stranding is noted bilaterally. Mild stranding tracks along both paracolic gutters. No free fluid is identified. The small bowel is unremarkable in appearance. The stomach is within normal limits. No acute vascular abnormalities are seen. Mild scattered calcification is noted along the abdominal aorta and its branches. Mild retroperitoneal stranding is nonspecific. The appendix is grossly unremarkable in appearance. The colon is grossly unremarkable. Mild stranding about the distal sigmoid colon is nonspecific. The bladder is  mildly distended and grossly unremarkable. The uterus is within normal limits. The ovaries are grossly symmetric. No suspicious adnexal masses are seen. No inguinal lymphadenopathy is seen. No acute osseous abnormalities are identified. IMPRESSION: 1. No definite acute abnormality seen to explain the patient's symptoms. 2. Minimal bibasilar airspace opacities demonstrate associated bronchiectasis and may reflect mild scarring or possibly chronic sequelae of infection. 3. Mild soft tissue stranding about the distal sigmoid colon, along the retroperitoneum and along the paracolic gutters is nonspecific but may reflect the patient's cirrhosis. 4. Findings of hepatic cirrhosis, with likely regenerative and degenerative nodules. No definite dominant mass seen. 5. Scattered coronary artery calcifications seen. 6. Mild calcification along the abdominal aorta and its branches. Electronically Signed   By: Garald Balding M.D.   On: 11/06/2015 18:39   Dg Chest 2 View  11/06/2015  CLINICAL DATA:  Chest pain, dyspnea for 3 days. EXAM: CHEST  2 VIEW COMPARISON:  July 22, 2014. FINDINGS: The heart size and mediastinal contours are within normal limits. Both lungs are clear. No pneumothorax or pleural effusion is noted. The visualized skeletal structures are unremarkable. IMPRESSION: No active cardiopulmonary disease. Electronically Signed   By: Marijo Conception, M.D.   On: 11/06/2015 13:50   Dg Chest Port 1 View  11/14/2015  CLINICAL DATA:  Cough and chest pain EXAM: PORTABLE CHEST - 1 VIEW COMPARISON:  11/06/2015 FINDINGS: Cardiac shadow is within normal limits. The lungs are well aerated bilaterally. Mild peribronchial cuffing is noted which may represent some bronchitis. No focal infiltrate or sizable effusion is seen. Postsurgical changes in the left humerus are noted. IMPRESSION: Changes consistent with mild bronchitis. No focal infiltrate is seen. Electronically Signed   By: Inez Catalina M.D.   On: 11/14/2015 10:33    US Abdomen Limited Ruq  11/07/2015  CLINICAL DATA:  Hepatic encephalopathy.  Jaundiced. EXAM: US ABDOMEN LIMITED - RIGHT UPPER QUADRANT COMPARISON:  CT scan 11/06/2015 FINDINGS: Gallbladder: Moderate distention of the gallbladder and numerous small gallstones. No wall thickening or pericholecystic fluid. Negative sonographic Murphy sign. Common bile duct: Diameter: 3.2 mm Liver: Severe changes of cirrhosis and fatty infiltration. No focal hepatic lesions are identified. Reversal of flow in the main portal vein suggesting severe portal hypertension. No obvious ascites. IMPRESSION: 1. Distended gallbladder with numerous small gallstones. No wall thickening, pericholecystic fluid or sonographic Murphy sign to suggest acute cholecystitis. 2. Normal caliber common bile duct. 3. Severe cirrhotic changes and fatty infiltration of the liver. 4. Reversal of flow in the main portal vein consistent with severe portal hypertension. Electronically Signed   By: Marijo Sanes M.D.   On: 11/07/2015 10:32    Micro Results    Recent Results (from the past 240 hour(s))  C difficile quick scan w PCR reflex     Status: None   Collection Time: 11/12/15  6:00 PM  Result Value Ref Range Status   C Diff antigen NEGATIVE NEGATIVE Final   C Diff toxin NEGATIVE NEGATIVE Final   C Diff interpretation  Negative for C. difficile  Final       Today   Subjective:   Abigail Cook today has no headache,no chest abdominal pain,no new weakness tingling or numbness, feels much better wants to go home today.   Objective:   Blood pressure 137/55, pulse 52, temperature 98.8 F (37.1 C), temperature source Oral, resp. rate 19, height 5\' 2"  (1.575 m), weight 97.07 kg (214 lb), SpO2 98 %.   Intake/Output Summary (Last 24 hours) at 11/20/15 1411 Last data filed at 11/20/15 0826  Gross per 24 hour  Intake   1080 ml  Output   1950 ml  Net   -870 ml    Exam Awake Alert, Oriented x 3, No new F.N deficits, Normal  affect [place].AT,PERRAL Supple Neck,No JVD, No cervical lymphadenopathy appriciated.  Symmetrical Chest wall movement, Good air movement bilaterally, CTAB RRR,No Gallops,Rubs or new Murmurs, No Parasternal Heave +ve B.Sounds, Abd Soft, Non tender, No organomegaly appriciated, No rebound -guarding or rigidity. No Cyanosis, Clubbing or edema, No new Rash or bruise  Data Review   CBC w Diff: Lab Results  Component Value Date   WBC 14.9* 11/19/2015   WBC 8.6 07/26/2014   HGB 8.6* 11/19/2015   HGB 9.1* 07/26/2014   HCT 26.3* 11/19/2015   HCT 28.5* 07/26/2014   PLT 118* 11/19/2015   PLT 134* 07/26/2014   LYMPHOPCT 18.3 07/26/2014   MONOPCT 12.6 07/26/2014   EOSPCT 1.9 07/26/2014   BASOPCT 0.7 07/26/2014    CMP: Lab Results  Component Value Date   NA 138 11/20/2015   NA 136 07/25/2014   K 3.6 11/20/2015   K 3.6 07/25/2014   CL 106 11/20/2015   CL 103 07/25/2014   CO2 22 11/20/2015   CO2 26 07/25/2014   BUN 22* 11/20/2015   BUN 7 07/25/2014   CREATININE UNABLE TO REPORT DUE TO ICTERUS 11/20/2015   CREATININE 0.91 07/25/2014   PROT 6.2* 11/16/2015   PROT 8.7* 07/22/2014   ALBUMIN 1.6* 11/16/2015   ALBUMIN 3.0* 07/22/2014   BILITOT 25.9* 11/16/2015   BILITOT 0.5 07/22/2014   ALKPHOS 64 11/16/2015   ALKPHOS 60 07/22/2014   AST 146* 11/16/2015   AST 67* 07/22/2014   ALT 77* 11/16/2015   ALT 38 07/22/2014  .   Total Time in preparing paper work, data evaluation and todays exam - 38 minutes  Nyaja Dubuque M.D on 11/20/2015 at 2:11 PM    Note: This dictation was prepared with Dragon dictation along with smaller phrase technology. Any transcriptional errors that result from this process are unintentional.

## 2015-11-20 NOTE — Care Management (Addendum)
Patient was discharged to care of son today whom she resides. Contacted Vance Gather at [facility] ext [phone] to confirm PCP on Medicaid card which was Dr Domenick Gong. Contacted Dr Lang[facility] Masker office and spoke with Priddy who confirms that the patient was a patient in Dr Langston Masker office [facility] [place])but had been a no-show for her appointments in August and September. Now Dr Ilene Qua is retired and this clinic is not taking new patients. [facility] who is accepting patient but not until January.  [place] and spoke with Alyse Low who was able to get patient in for an appointment on the 9th of December at 1:15 with Dr Marijo File. Contacted Doreatha Lew at [place] to inform of appointment, Marcene Brawn stated that she had been in contact with the patient. Contacted Vance Gather Medicaid who will change patient Medicaid card to new PCP Dr Marylou Mccoy. Attempted to contact patient by cell phone and received voicemail and left message for patient to call me back, left information about appointment , December 9th at 1:15 bring photo ID Medicaid card and medications in bottles. $3 dollar co pay.

## 2015-11-20 NOTE — Progress Notes (Addendum)
Spoke with patient who is up and ambulating in room without assistance. She is alert and oriented and stated that she lives in her own residence and has a walker at home but that she doesn't use it. Patient has medicaid which will not provide financial assistance for PT, Discussed with Dr Vianne Bulls who feels patient OK to discharge without physical therapy in the home. Agrees with RN and Social Work.  Patient stated that she will be getting and aid to come to her home. Patient will need HH and choice offered for [facility] providers. Patient elected to go with [facility] due to affiliation with [place]. Referral placed with [facility] for CSW ands RN to visit the home.  Discharge to home today. Patient expressed appreciation for services.

## 2015-11-20 NOTE — Progress Notes (Signed)
[facility]  ROUNDING NOTE   Subjective:  Patient sitting up in bed and in good spirits. BUN down to 22. Creatinine still can't be calculated due to icterus. However good urine output of 2.9 L noted. Potassium normalized at 3.6 today.  Objective:  Vital signs in last 24 hours:  Temp:  [98.2 F (36.8 C)-98.8 F (37.1 C)] 98.8 F (37.1 C) (11/30 0439) Pulse Rate:  [52-60] 52 (11/30 0439) Resp:  [17-19] 19 (11/30 0439) BP: (133-137)/(55-74) 137/55 mmHg (11/30 0439) SpO2:  [98 %-100 %] 98 % (11/30 0747)  Weight change:  Filed Weights   11/06/15 1250  Weight: 97.07 kg (214 lb)    Intake/Output: I/O last 3 completed shifts: In: 2652.8 [P.O.:1800; I.V.:664.8; IV Piggyback:188] Out: 2900 [Urine:2900]   Intake/Output this shift:     Physical Exam: General: NAD  Head: Normocephalic, atraumatic. Moist oral mucosal membranes  Eyes: Icterus noted  Neck: Supple, trachea midline  Lungs:  Clear to auscultation normal effort  Heart: Regular rate and rhythm  Abdomen:  Soft, nontender, BS present  Extremities:  no peripheral edema.  Neurologic: Nonfocal, moving all four extremities  Skin: No lesions       Basic Metabolic Panel:  Recent Labs Lab 11/13/15 1039 11/15/15 0826 11/16/15 1224 11/19/15 0515 11/20/15 0838  NA 136 134* 131* 138 138  K 3.4* 3.6 3.2* 3.2* 3.6  CL 102 101 99* 106 106  CO2 26 24 24 23 22   GLUCOSE 89 129* 122* 125* 128*  BUN 38* 38* 30* 26* 22*  CREATININE UNABLE TO REPORT DUE TO ICTERUS UNABLE TO REPORT DUE TO ICTERUS SEE COMMENTS UNABLE TO REPORT DUE TO ICTERUS UNABLE TO REPORT DUE TO ICTERUS  CALCIUM 8.5* 8.2* 7.9* 8.2* 8.7*    Liver Function Tests:  Recent Labs Lab 11/13/15 1039 11/14/15 0454 11/16/15 1224  AST 90* 107* 146*  ALT 51 58* 77*  ALKPHOS 53 56 64  BILITOT 25.6* 26.7* 25.9*  PROT 6.1* 6.5 6.2*  ALBUMIN 1.5* 1.7* 1.6*   No results for input(s): LIPASE, AMYLASE in the last 168 hours. No results for input(s):  AMMONIA in the last 168 hours.  CBC:  Recent Labs Lab 11/14/15 0454 11/14/15 1235 11/17/15 0450 11/19/15 0515  WBC 19.0* 16.2* 16.3* 14.9*  HGB 9.9* 9.2* 9.4* 8.6*  HCT 29.9* 28.0* 28.9* 26.3*  MCV 94.6 96.4 98.0 99.2  PLT 111* 105* 127* 118*    Cardiac Enzymes: No results for input(s): CKTOTAL, CKMB, CKMBINDEX, TROPONINI in the last 168 hours.  BNP: Invalid input(s): POCBNP  CBG: No results for input(s): GLUCAP in the last 168 hours.  Microbiology: Results for orders placed or performed during the hospital encounter of 11/06/15  Culture, blood (routine x 2)     Status: None   Collection Time: 11/06/15  9:08 PM  Result Value Ref Range Status   Specimen Description BLOOD RIGHT HAND  Final   Special Requests BOTTLES DRAWN AEROBIC AND ANAEROBIC 5ML  Final   Culture NO GROWTH 7 DAYS  Final   Report Status 11/13/2015 FINAL  Final  Culture, blood (routine x 2)     Status: None   Collection Time: 11/06/15  9:09 PM  Result Value Ref Range Status   Specimen Description BLOOD RIGHT ANTECUBITAL  Final   Special Requests   Final    BOTTLES DRAWN AEROBIC AND ANAEROBIC 10MLANAEROBIC, 14MLAEROBIC   Culture NO GROWTH 7 DAYS  Final   Report Status 11/13/2015 FINAL  Final  Urine culture  Status: None   Collection Time: 11/07/15  1:30 AM  Result Value Ref Range Status   Specimen Description URINE, CATHETERIZED  Final   Special Requests NONE  Final   Culture INSIGNIFICANT GROWTH  Final   Report Status 11/08/2015 FINAL  Final  C difficile quick scan w PCR reflex     Status: None   Collection Time: 11/12/15  6:00 PM  Result Value Ref Range Status   C Diff antigen NEGATIVE NEGATIVE Final   C Diff toxin NEGATIVE NEGATIVE Final   C Diff interpretation Negative for C. difficile  Final    Coagulation Studies: No results for input(s): LABPROT, INR in the last 72 hours.  Urinalysis: No results for input(s): COLORURINE, LABSPEC, PHURINE, GLUCOSEU, HGBUR, BILIRUBINUR, KETONESUR,  PROTEINUR, UROBILINOGEN, NITRITE, LEUKOCYTESUR in the last 72 hours.  Invalid input(s): APPERANCEUR    Imaging: No results found.   Medications:     . albumin human  25 g Intravenous Q8H  . albuterol  2.5 mg Nebulization Q6H  . fluconazole  100 mg Oral Daily  . folic acid  1 mg Oral Daily  . lactulose  10 g Oral BID  . multivitamin with minerals  1 tablet Oral Daily  . nadolol  20 mg Oral Daily  . pantoprazole (PROTONIX) IV  40 mg Intravenous Q12H  . prednisoLONE  40 mg Oral QAC breakfast  . sodium chloride  3 mL Intravenous Q12H  . thiamine  100 mg Oral Daily   morphine injection, ondansetron **OR** ondansetron (ZOFRAN) IV  Assessment/ Plan:  49 y.o. female with a PMHx of hypertension, seizure disorder,alcohol abuse, substance abuse (cocaine) who was admitted to [facility] on 11/06/2015 for evaluation of abdominal pain, malaise, and body aches.  1. Acute renal failure. 2. Hyponatremia. 3. Hypokalemia. - improved 4. Liver cirrhosis. 5. Hyperbilirubinemia. 6. Anemia unspecified with melena.  Plan: It appears that the patient's renal function continues to improve. BUN is down to 22 however we cannot calculate creatinine secondary to icterus and hyperbilirubinemia. Serum potassium was corrected and potassium was up to 3.6 today. We would recommend continued monitoring of liver function tests as time goes by. Overall the patient appears to be doing better than admission.   LOS: 14 Lexander Tremblay 11/30/201610:19 AM

## 2015-11-22 LAB — HCV RNA QUANT
HCV QUANT LOG: 4.418 {Log_IU}/mL (ref >1.70)
HCV QUANT: 26200 [IU]/mL (ref >50)

## 2015-11-22 LAB — HEPATITIS C GENOTYPE

## 2015-11-29 ENCOUNTER — Other Ambulatory Visit: Payer: Self-pay

## 2015-11-29 ENCOUNTER — Emergency Department: Payer: Medicaid Other

## 2015-11-29 ENCOUNTER — Inpatient Hospital Stay
Admission: EM | Admit: 2015-11-29 | Discharge: 2015-12-02 | DRG: 189 | Disposition: A | Discharge status: Home / Self Care | Payer: Medicaid Other | Attending: Internal Medicine | Admitting: Internal Medicine

## 2015-11-29 ENCOUNTER — Encounter: Payer: Self-pay | Admitting: Emergency Medicine

## 2015-11-29 DIAGNOSIS — G40909 Epilepsy, unspecified, not intractable, without status epilepticus: Secondary | ICD-10-CM | POA: Diagnosis present

## 2015-11-29 DIAGNOSIS — Z79899 Other long term (current) drug therapy: Secondary | ICD-10-CM | POA: Diagnosis not present

## 2015-11-29 DIAGNOSIS — Z9889 Other specified postprocedural states: Secondary | ICD-10-CM

## 2015-11-29 DIAGNOSIS — B1920 Unspecified viral hepatitis C without hepatic coma: Secondary | ICD-10-CM | POA: Diagnosis present

## 2015-11-29 DIAGNOSIS — E876 Hypokalemia: Secondary | ICD-10-CM | POA: Diagnosis present

## 2015-11-29 DIAGNOSIS — D638 Anemia in other chronic diseases classified elsewhere: Secondary | ICD-10-CM | POA: Diagnosis present

## 2015-11-29 DIAGNOSIS — J9601 Acute respiratory failure with hypoxia: Secondary | ICD-10-CM | POA: Diagnosis present

## 2015-11-29 DIAGNOSIS — J9600 Acute respiratory failure, unspecified whether with hypoxia or hypercapnia: Secondary | ICD-10-CM | POA: Diagnosis present

## 2015-11-29 DIAGNOSIS — F1010 Alcohol abuse, uncomplicated: Secondary | ICD-10-CM | POA: Diagnosis present

## 2015-11-29 DIAGNOSIS — J9801 Acute bronchospasm: Secondary | ICD-10-CM

## 2015-11-29 DIAGNOSIS — N189 Chronic kidney disease, unspecified: Secondary | ICD-10-CM | POA: Diagnosis present

## 2015-11-29 DIAGNOSIS — F1410 Cocaine abuse, uncomplicated: Secondary | ICD-10-CM | POA: Diagnosis present

## 2015-11-29 DIAGNOSIS — Z87891 Personal history of nicotine dependence: Secondary | ICD-10-CM | POA: Diagnosis not present

## 2015-11-29 DIAGNOSIS — K7030 Alcoholic cirrhosis of liver without ascites: Secondary | ICD-10-CM | POA: Diagnosis present

## 2015-11-29 DIAGNOSIS — N179 Acute kidney failure, unspecified: Secondary | ICD-10-CM | POA: Diagnosis present

## 2015-11-29 DIAGNOSIS — J441 Chronic obstructive pulmonary disease with (acute) exacerbation: Secondary | ICD-10-CM | POA: Diagnosis present

## 2015-11-29 DIAGNOSIS — R1011 Right upper quadrant pain: Secondary | ICD-10-CM

## 2015-11-29 DIAGNOSIS — R0600 Dyspnea, unspecified: Secondary | ICD-10-CM

## 2015-11-29 DIAGNOSIS — I129 Hypertensive chronic kidney disease with stage 1 through stage 4 chronic kidney disease, or unspecified chronic kidney disease: Secondary | ICD-10-CM | POA: Diagnosis present

## 2015-11-29 HISTORY — DX: Chronic kidney disease, unspecified: N18.9

## 2015-11-29 LAB — CBC
HEMATOCRIT: 29.9 % — AB (ref 35.0–47.0)
HEMOGLOBIN: 9.7 g/dL — AB (ref 12.0–16.0)
MCH: 33.2 pg (ref 26.0–34.0)
MCHC: 32.5 g/dL (ref 32.0–36.0)
MCV: 102.1 fL — AB (ref 80.0–100.0)
PLATELETS: 86 10*3/uL — AB (ref 150–440)
RBC: 2.93 MIL/uL — AB (ref 3.80–5.20)
RDW: 28.6 % — ABNORMAL HIGH (ref 11.5–14.5)
WBC: 12.9 10*3/uL — AB (ref 3.6–11.0)

## 2015-11-29 LAB — MRSA PCR SCREENING: MRSA BY PCR: POSITIVE — AB

## 2015-11-29 LAB — URINE DRUG SCREEN, QUALITATIVE (ARMC ONLY)
AMPHETAMINES, UR SCREEN: NOT DETECTED
BENZODIAZEPINE, UR SCRN: NOT DETECTED
Barbiturates, Ur Screen: NOT DETECTED
Cannabinoid 50 Ng, Ur ~~LOC~~: NOT DETECTED
Cocaine Metabolite,Ur ~~LOC~~: NOT DETECTED
MDMA (ECSTASY) UR SCREEN: NOT DETECTED
Methadone Scn, Ur: NOT DETECTED
OPIATE, UR SCREEN: NOT DETECTED
PHENCYCLIDINE (PCP) UR S: NOT DETECTED
Tricyclic, Ur Screen: NOT DETECTED

## 2015-11-29 LAB — COMPREHENSIVE METABOLIC PANEL
ALBUMIN: 2.7 g/dL — AB (ref 3.5–5.0)
ALT: 69 U/L — ABNORMAL HIGH (ref 14–54)
AST: 83 U/L — AB (ref 15–41)
Alkaline Phosphatase: 84 U/L (ref 38–126)
Anion gap: 12 (ref 5–15)
BILIRUBIN TOTAL: 29.8 mg/dL — AB (ref 0.3–1.2)
BUN: 12 mg/dL (ref 6–20)
CO2: 24 mmol/L (ref 22–32)
Calcium: 8.6 mg/dL — ABNORMAL LOW (ref 8.9–10.3)
Chloride: 107 mmol/L (ref 101–111)
Creatinine, Ser: UNDETERMINED mg/dL (ref 0.44–1.00)
GLUCOSE: 147 mg/dL — AB (ref 65–99)
POTASSIUM: 2.7 mmol/L — AB (ref 3.5–5.1)
Sodium: 143 mmol/L (ref 135–145)
TOTAL PROTEIN: 6.5 g/dL (ref 6.5–8.1)

## 2015-11-29 LAB — BRAIN NATRIURETIC PEPTIDE: B Natriuretic Peptide: 186 pg/mL — ABNORMAL HIGH (ref 0.0–100.0)

## 2015-11-29 LAB — RAPID HIV SCREEN (HIV 1/2 AB+AG)
HIV 1/2 ANTIBODIES: NONREACTIVE
HIV-1 P24 ANTIGEN - HIV24: NONREACTIVE

## 2015-11-29 LAB — TROPONIN I: Troponin I: 0.03 ng/mL (ref <0.031)

## 2015-11-29 LAB — MAGNESIUM: Magnesium: 1.2 mg/dL — ABNORMAL LOW (ref 1.7–2.4)

## 2015-11-29 LAB — APTT: aPTT: 38 seconds — ABNORMAL HIGH (ref 24–36)

## 2015-11-29 LAB — PROTIME-INR
INR: 1.51
Prothrombin Time: 18.3 seconds — ABNORMAL HIGH (ref 11.4–15.0)

## 2015-11-29 MED ORDER — CHLORHEXIDINE GLUCONATE 0.12 % MT SOLN
15.0000 mL | Freq: Two times a day (BID) | OROMUCOSAL | Status: DC
  Administered 2015-11-29 – 2015-12-02 (×6): 15 mL via OROMUCOSAL
  Filled 2015-11-29 (×6): qty 15

## 2015-11-29 MED ORDER — PREDNISOLONE 15 MG/5ML PO SOLN
40.0000 mg | Freq: Every day | ORAL | Status: DC
  Administered 2015-11-30 – 2015-12-02 (×3): 40 mg via ORAL
  Filled 2015-11-29 (×6): qty 15

## 2015-11-29 MED ORDER — ACETAMINOPHEN 650 MG RE SUPP: 650.0000 mg | Freq: Four times a day (QID) | RECTAL | Status: DC | PRN

## 2015-11-29 MED ORDER — DEXTROSE 5 % IV SOLN
500.0000 mg | Freq: Every day | INTRAVENOUS | Status: DC
  Filled 2015-11-29: qty 500

## 2015-11-29 MED ORDER — SODIUM CHLORIDE 0.9 % IV SOLN
INTRAVENOUS | Status: DC
  Administered 2015-11-29: 18:00:00 via INTRAVENOUS

## 2015-11-29 MED ORDER — CETYLPYRIDINIUM CHLORIDE 0.05 % MT LIQD
7.0000 mL | Freq: Two times a day (BID) | OROMUCOSAL | Status: DC
  Administered 2015-11-30 – 2015-12-02 (×4): 7 mL via OROMUCOSAL

## 2015-11-29 MED ORDER — PANTOPRAZOLE SODIUM 40 MG PO TBEC
40.0000 mg | DELAYED_RELEASE_TABLET | Freq: Every day | ORAL | Status: DC
  Administered 2015-11-30 – 2015-12-02 (×3): 40 mg via ORAL
  Filled 2015-11-29 (×3): qty 1

## 2015-11-29 MED ORDER — METHYLPREDNISOLONE SODIUM SUCC 125 MG IJ SOLR
60.0000 mg | Freq: Four times a day (QID) | INTRAMUSCULAR | Status: DC
  Administered 2015-11-29 – 2015-12-01 (×7): 60 mg via INTRAVENOUS
  Filled 2015-11-29 (×7): qty 2

## 2015-11-29 MED ORDER — LORAZEPAM 1 MG PO TABS: 1.0000 mg | ORAL_TABLET | Freq: Four times a day (QID) | ORAL | Status: AC | PRN

## 2015-11-29 MED ORDER — INFLUENZA VAC SPLIT QUAD 0.5 ML IM SUSY
0.5000 mL | PREFILLED_SYRINGE | INTRAMUSCULAR | Status: AC
  Administered 2015-11-30: 0.5 mL via INTRAMUSCULAR
  Filled 2015-11-29 (×2): qty 0.5

## 2015-11-29 MED ORDER — THIAMINE HCL 100 MG/ML IJ SOLN
100.0000 mg | Freq: Every day | INTRAMUSCULAR | Status: DC
  Administered 2015-11-29: 100 mg via INTRAVENOUS
  Filled 2015-11-29: qty 2

## 2015-11-29 MED ORDER — HEPARIN SODIUM (PORCINE) 5000 UNIT/ML IJ SOLN
5000.0000 [IU] | Freq: Three times a day (TID) | INTRAMUSCULAR | Status: DC
  Administered 2015-11-29 – 2015-12-02 (×9): 5000 [IU] via SUBCUTANEOUS
  Filled 2015-11-29 (×9): qty 1

## 2015-11-29 MED ORDER — VITAMIN B-1 100 MG PO TABS
100.0000 mg | ORAL_TABLET | Freq: Every day | ORAL | Status: DC
Start: 2015-11-29 — End: 2015-12-02
  Administered 2015-11-30 – 2015-12-02 (×3): 100 mg via ORAL
  Filled 2015-11-29 (×3): qty 1

## 2015-11-29 MED ORDER — IPRATROPIUM-ALBUTEROL 0.5-2.5 (3) MG/3ML IN SOLN
3.0000 mL | RESPIRATORY_TRACT | Status: DC
  Administered 2015-11-29 – 2015-12-02 (×17): 3 mL via RESPIRATORY_TRACT
  Filled 2015-11-29 (×18): qty 3

## 2015-11-29 MED ORDER — LORAZEPAM 2 MG/ML IJ SOLN: 1.0000 mg | Freq: Four times a day (QID) | INTRAMUSCULAR | Status: AC | PRN

## 2015-11-29 MED ORDER — IPRATROPIUM-ALBUTEROL 0.5-2.5 (3) MG/3ML IN SOLN
9.0000 mL | Freq: Once | RESPIRATORY_TRACT | Status: AC
  Administered 2015-11-29: 9 mL via RESPIRATORY_TRACT
  Filled 2015-11-29: qty 9

## 2015-11-29 MED ORDER — LACTULOSE 10 GM/15ML PO SOLN
10.0000 g | Freq: Two times a day (BID) | ORAL | Status: DC
  Administered 2015-11-29 – 2015-12-02 (×6): 10 g via ORAL
  Filled 2015-11-29 (×6): qty 30

## 2015-11-29 MED ORDER — ALBUTEROL SULFATE (2.5 MG/3ML) 0.083% IN NEBU
5.0000 mg | INHALATION_SOLUTION | Freq: Once | RESPIRATORY_TRACT | Status: AC
  Administered 2015-11-29: 5 mg via RESPIRATORY_TRACT
  Filled 2015-11-29: qty 6

## 2015-11-29 MED ORDER — PNEUMOCOCCAL VAC POLYVALENT 25 MCG/0.5ML IJ INJ
0.5000 mL | INJECTION | INTRAMUSCULAR | Status: AC
  Administered 2015-11-30: 0.5 mL via INTRAMUSCULAR
  Filled 2015-11-29: qty 0.5

## 2015-11-29 MED ORDER — ADULT MULTIVITAMIN W/MINERALS CH
1.0000 | ORAL_TABLET | Freq: Every day | ORAL | Status: DC
  Filled 2015-11-29: qty 1

## 2015-11-29 MED ORDER — FOLIC ACID 1 MG PO TABS
1.0000 mg | ORAL_TABLET | Freq: Every day | ORAL | Status: DC
  Administered 2015-11-30 – 2015-12-02 (×3): 1 mg via ORAL
  Filled 2015-11-29 (×3): qty 1

## 2015-11-29 MED ORDER — METHYLPREDNISOLONE SODIUM SUCC 125 MG IJ SOLR
125.0000 mg | Freq: Once | INTRAMUSCULAR | Status: AC
  Administered 2015-11-29: 125 mg via INTRAVENOUS
  Filled 2015-11-29: qty 2

## 2015-11-29 MED ORDER — ACETAMINOPHEN 325 MG PO TABS: 650.0000 mg | ORAL_TABLET | Freq: Four times a day (QID) | ORAL | Status: DC | PRN

## 2015-11-29 MED ORDER — POTASSIUM CHLORIDE 10 MEQ/100ML IV SOLN
10.0000 meq | INTRAVENOUS | Status: AC
Start: 2015-11-29 — End: 2015-11-29
  Administered 2015-11-29 (×2): 10 meq via INTRAVENOUS
  Filled 2015-11-29 (×2): qty 100

## 2015-11-29 MED ORDER — FLUCONAZOLE 100 MG PO TABS
100.0000 mg | ORAL_TABLET | Freq: Every day | ORAL | Status: DC
  Administered 2015-11-30 – 2015-12-02 (×3): 100 mg via ORAL
  Filled 2015-11-29 (×3): qty 1

## 2015-11-29 MED ORDER — POTASSIUM CHLORIDE 10 MEQ/100ML IV SOLN: 10.0000 meq | INTRAVENOUS | Status: DC

## 2015-11-29 MED ORDER — DEXTROSE 5 % IV SOLN
500.0000 mg | Freq: Once | INTRAVENOUS | Status: AC
  Administered 2015-11-29: 500 mg via INTRAVENOUS
  Filled 2015-11-29: qty 500

## 2015-11-29 NOTE — ED Notes (Signed)
RT called for duonebs

## 2015-11-29 NOTE — H&P (Signed)
[facility] [place] NAME: Abigail Cook    MR#:  [id number]  DATE OF BIRTH:  1966-07-27  DATE OF ADMISSION:  11/29/2015  PRIMARY CARE PHYSICIAN: No PCP Per Patient   REQUESTING/REFERRING PHYSICIAN: Dr Clearnce Hasten  CHIEF COMPLAINT:   Chief Complaint  Patient presents with  . Respiratory Distress  . Chest Pain    HISTORY OF PRESENT ILLNESS:  Abigail Cook  is a 49 y.o. 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 She states that she has been short of breath for 2-3 days getting much worse. Today she was so short of breath at rest that she came to the ED. She has not been able to get in with a primary care provider since her discharge. She does not have a known history of COPD or asthma but she is a former smoker. She states that she has not been using any inhalants including cigarettes or cocaine this week. She has not had fevers, chills, congestion. She has had a dry cough. Her chest x-ray is clear. She is oxygenating well. She is having significant work of breathing and wheezing and has been placed on BiPAP in the emergency room.   PAST MEDICAL HISTORY:   Past Medical History  Diagnosis Date  . Hypertension   . Seizures ([place])   . Chronic kidney disease     PAST SURGICAL HISTORY:   Past Surgical History  Procedure Laterality Date  . Rod placed in right leg/foot    . Esophagogastroduodenoscopy Left 11/18/2015    Procedure: ESOPHAGOGASTRODUODENOSCOPY (EGD);  Surgeon: Hulen Luster, MD;  Location: [facility] ENDOSCOPY;  Service: Endoscopy;  Laterality: Left;    SOCIAL HISTORY:   Social History  Substance Use Topics  . Smoking status: Former Research scientist (life sciences)  . Smokeless tobacco: Not on file  . Alcohol  Use: 7.2 oz/week    12 Cans of beer per week     Comment: every other day    FAMILY HISTORY:  No family history on file. Unable to obtain as patient on BiPAP with severe respiratory distress  DRUG ALLERGIES:  No Known Allergies  REVIEW OF SYSTEMS:   ROS Unable to obtain due to severe respiratory distress  MEDICATIONS AT HOME:   Prior to Admission medications   Medication Sig Start Date End Date Taking? Authorizing Provider  fluconazole (DIFLUCAN) 100 MG tablet Take 1 tablet (100 mg total) by mouth daily. 11/20/15  Yes Epifanio Lesches, MD  lactulose (CHRONULAC) 10 GM/15ML solution Take 15 mLs (10 g total) by mouth 2 (two) times daily. 11/20/15  Yes Epifanio Lesches, MD  pantoprazole (PROTONIX) 40 MG tablet Take 1 tablet (40 mg total) by mouth daily. 11/20/15  Yes Epifanio Lesches, MD  prednisoLONE (PRELONE) 15 MG/5ML SOLN Take 13.3 mLs (40 mg total) by mouth daily before breakfast. 11/20/15  Yes Epifanio Lesches, MD  nadolol (CORGARD) 20 MG tablet Take 1 tablet (20 mg total) by mouth daily. Patient not taking: Reported on 11/29/2015 11/20/15   Epifanio Lesches, MD      VITAL SIGNS:  Blood pressure 145/87, pulse 68, temperature 98 F (36.7 C), temperature source Oral, resp. rate 35, height 5\' 2"  (1.575 m), weight 100.064 kg (220 lb 9.6 oz), SpO2 100 %.  PHYSICAL EXAMINATION:  GENERAL:  49 y.o.-year-old patientsitting up in bed, severe respiratory distress, BiPAP in place , Obese  EYES: Pupils equal, round, reactive to light and accommodation. No scleral icterus. Extraocular muscles intact.  HEENT: Head atraumatic, normocephalic.BiPAP mask obscures vision of the mouth  NECK:  Supple, no jugular venous distention. No thyroid enlargement, no tenderness.  LUNGS: Short shallow respirations, poor air movement, loud wheezing throughout all lung fields  CARDIOVASCULAR:  distant S1, S2 normal. No murmurs, rubs, or gallops.  ABDOMEN: Soft, nontender, nondistended. Bowel  sounds present. No organomegaly or mass.No guarding no rebound  EXTREMITIES:Trace bilateral  pedal edema, cyanosis, or clubbing. Pulses 2+  NEUROLOGIC: Cranial nerves II through XII aregrossly  intact. Muscle strength 5/5 in all extremities. Sensation intact. Gait not checked.  PSYCHIATRIC: The patient is alert and seems  oriented, follows commands, answers yes or no questions appropriately SKIN: No obvious rash, lesion, or ulcer.   LABORATORY PANEL:   CBC  Recent Labs Lab 11/29/15 1419  WBC 12.9*  HGB 9.7*  HCT 29.9*  PLT 86*   ------------------------------------------------------------------------------------------------------------------  Chemistries   Recent Labs Lab 11/29/15 1419  NA 143  K 2.7*  CL 107  CO2 24  GLUCOSE 147*  BUN 12  CREATININE UNABLE TO REPORT DUE TO ICTERUS  CALCIUM 8.6*  AST 83*  ALT 69*  ALKPHOS 84  BILITOT 29.8*   ------------------------------------------------------------------------------------------------------------------  Cardiac Enzymes  Recent Labs Lab 11/29/15 1419  TROPONINI 0.03   ------------------------------------------------------------------------------------------------------------------  RADIOLOGY:  Dg Chest Portable 1 View  11/29/2015  CLINICAL DATA:  Difficulty breathing with chest pain. EXAM: PORTABLE CHEST 1 VIEW COMPARISON:  11/14/2015. FINDINGS: The heart size and mediastinal contours are within normal limits. Both lungs are clear. The visualized skeletal structures are unremarkable. Previous intramedullary rod for humerus fracture is stable. Compared with priors, mild bronchitic changes less well appreciated, likely secondary to portable technique today. IMPRESSION: No active disease.  Stable exam. Electronically Signed   By: Staci Righter M.D.   On: 11/29/2015 14:46    EKG:   Orders placed or performed during the hospital encounter of 11/29/15  . ED EKG  . ED EKG  . ED EKG  . ED EKG    IMPRESSION AND  PLAN:   1. Acute respiratory failure: No hypoxia. She is a former smoker. Chest x-ray is clear. She is wheezing audibly and has poor air movement. We'll treat as for COPD exacerbation. Continue with Solu-Medrol 60 mg every 6 hours, DuoNeb's every 4 hours, azithromycin. Continue BiPAP. Admit to ICU for close monitoring.  2. Hypokalemia: Replace. Check magnesium. Continue to monitor.  3. Kidney disease: Difficult to assess due to hyperbilirubinemia obscuring test results. She is a little hypernatremic today and has low sodium. Possibly dehydrated. We'll provide gentle hydration. Consult nephrology.  4. advanced cirrhosis due to hepatitis C, alcohol, cocaine: We'll consult gastroenterology for assistance in management of her hyperbilirubinemia which is worse today than on previous assessments. He does not seem encephalopathic at this time. Continue prednisolone and lactulose. Continue Protonix and nadolol.  5. Anemia of chronic disease: Stable no bleeding noted  6. Recent history of heavy alcohol abuse: Patient reports that she has consumed any alcohol or cocaine since last admission. Check UDS. Monitor using CIWA scale.    All the records are reviewed and case discussed with ED provider. Management plans discussed with the patient, family and they are in agreement.  CODE STATUS: Full. Discussed with the patient at the time of admission. Would consult palliative care on Monday for  goals of care. She is eligible for hospice services per her last discharge notes   TOTAL TIME TAKING CARE OF THIS PATIENT: 55 minutes.  Greater than 50% of time spent in coordination of care and counseling.  Myrtis Ser M.D on 11/29/2015 at 5:02 PM  Between 7am to 6pm - Pager - [phone]  After 6pm go to [email or web address] - password [email or web address] [email or web address]  [facility]  Office  [phone]  CC: Primary care physician; No PCP Per Patient

## 2015-11-29 NOTE — ED Provider Notes (Signed)
[facility] Emergency Department Provider Note  ____________________________________________  Time seen: Approximately 2 PM  I have reviewed the triage vital signs and the nursing notes.   HISTORY  Chief Complaint Respiratory Distress and Chest Pain    HPI Abigail Cook is a 49 y.o. female with a recent diagnosis of hepatic failure as well as renal failure who is presenting today with increasing shortness of breath over the past 9 days after being discharged from the hospital. She says she has had increased swelling to her lower extremities with increased work of breathing. She denies smoking or history of COPD. She says she has had a cough. Denies any fever.Says that she has pain in the left chest but only with coughing.   Past Medical History  Diagnosis Date  . Hypertension   . Seizures [facility])     Patient Active Problem List   Diagnosis Date Noted  . Pressure ulcer 11/19/2015  . Hyperbilirubinemia   . Alcoholic cirrhosis of liver with ascites ([place])   . Hepatic encephalopathy ([place]) 11/06/2015    Past Surgical History  Procedure Laterality Date  . Rod placed in right leg/foot    . Esophagogastroduodenoscopy Left 11/18/2015    Procedure: ESOPHAGOGASTRODUODENOSCOPY (EGD);  Surgeon: Hulen Luster, MD;  Location: [facility] ENDOSCOPY;  Service: Endoscopy;  Laterality: Left;    Current Outpatient Rx  Name  Route  Sig  Dispense  Refill  . fluconazole (DIFLUCAN) 100 MG tablet   Oral   Take 1 tablet (100 mg total) by mouth daily.   7 tablet   0   . lactulose (CHRONULAC) 10 GM/15ML solution   Oral   Take 15 mLs (10 g total) by mouth 2 (two) times daily.   240 mL   0   . pantoprazole (PROTONIX) 40 MG tablet   Oral   Take 1 tablet (40 mg total) by mouth daily.   30 tablet   0   . prednisoLONE (PRELONE) 15 MG/5ML SOLN   Oral   Take 13.3 mLs (40 mg total) by mouth daily before breakfast.   450 mL   0   . nadolol (CORGARD) 20 MG tablet   Oral    Take 1 tablet (20 mg total) by mouth daily. Patient not taking: Reported on 11/29/2015   30 tablet   0     Allergies Review of patient's allergies indicates no known allergies.  No family history on file.  Social History Social History  Substance Use Topics  . Smoking status: Former Research scientist (life sciences)  . Smokeless tobacco: None  . Alcohol Use: 7.2 oz/week    12 Cans of beer per week     Comment: every other day    Review of Systems Constitutional: No fever/chills Eyes: No visual changes. ENT: No sore throat. Cardiovascular: Denies chest pain. Respiratory: As above  Gastrointestinal: No abdominal pain.  No nausea, no vomiting.  No diarrhea.  No constipation. Genitourinary: Negative for dysuria. Musculoskeletal: Negative for back pain. Skin: Negative for rash. Neurological: Negative for headaches, focal weakness or numbness.  10-point ROS otherwise negative.  ____________________________________________   PHYSICAL EXAM:  VITAL SIGNS: ED Triage Vitals  Enc Vitals Group     BP 11/29/15 1326 130/61 mmHg     Pulse Rate 11/29/15 1326 82     Resp 11/29/15 1326 28     Temp 11/29/15 1326 98 F (36.7 C)     Temp Source 11/29/15 1326 Oral     SpO2 11/29/15 1326 100 %  Weight 11/29/15 1326 220 lb 9.6 oz (100.064 kg)     Height 11/29/15 1326 5\' 2"  (1.575 m)     Head Cir --      Peak Flow --      Pain Score 11/29/15 1323 10     Pain Loc --      Pain Edu? --      Excl. in [place]? --     Constitutional: Alert and oriented. Increased work of breathing. No acute distress. Holding breathing treatment in the room when I entered. Eyes: Conjunctivae are normal. PERRL. EOMI. Head: Atraumatic. Nose: No congestion/rhinnorhea. Mouth/Throat: Mucous membranes are moist.  Oropharynx non-erythematous. Neck: No stridor.   Cardiovascular: Normal rate, regular rhythm. Grossly normal heart sounds.  Good peripheral circulation. Respiratory: Increased respiratory effort with mild supraclavicular  retractions bilaterally. Decreased lung sounds throughout with crackles to the lower and mid fields bilaterally. Gastrointestinal: Soft and nontender. No distention. No abdominal bruits. No CVA tenderness. Musculoskeletal: Moderate pitting lower extremity edema to the mid calves bilaterally.  Neurologic:  Normal speech and language. No gross focal neurologic deficits are appreciated. No gait instability. Skin:  Skin is warm, dry and intact. No rash noted. Psychiatric: Mood and affect are normal. Speech and behavior are normal.  ____________________________________________   LABS (all labs ordered are listed, but only abnormal results are displayed)  Labs Reviewed  CBC - Abnormal; Notable for the following:    WBC 12.9 (*)    RBC 2.93 (*)    Hemoglobin 9.7 (*)    HCT 29.9 (*)    MCV 102.1 (*)    RDW 28.6 (*)    Platelets 86 (*)    All other components within normal limits  COMPREHENSIVE METABOLIC PANEL - Abnormal; Notable for the following:    Potassium 2.7 (*)    Glucose, Bld 147 (*)    Calcium 8.6 (*)    Albumin 2.7 (*)    AST 83 (*)    ALT 69 (*)    Total Bilirubin 29.8 (*)    All other components within normal limits  BRAIN NATRIURETIC PEPTIDE - Abnormal; Notable for the following:    B Natriuretic Peptide 186.0 (*)    All other components within normal limits  TROPONIN I  URINE DRUG SCREEN, QUALITATIVE ([facility] ONLY)  PROTIME-INR  APTT   ____________________________________________  EKG  ED ECG REPORT I, Doran Stabler, the attending physician, personally viewed and interpreted this ECG.   Date: 11/29/2015  EKG Time: 1331  Rate: 88  Rhythm: normal sinus rhythm  Axis: Normal axis  Intervals:none  ST&T Change: No ST segment elevation or depression. T-wave inversion in V2 through V4 which is unchanged from previous. ____________________________________________  RADIOLOGY  No active disease on the chest x-ray. Stable  exam. ____________________________________________   PROCEDURES  CRITICAL CARE Performed by: Doran Stabler   Total critical care time: 35 minutes  Critical care time was exclusive of separately billable procedures and treating other patients.  Critical care was necessary to treat or prevent imminent or life-threatening deterioration.  Critical care was time spent personally by me on the following activities: development of treatment plan with patient and/or surrogate as well as nursing, discussions with consultants, evaluation of patient's response to treatment, examination of patient, obtaining history from patient or surrogate, ordering and performing treatments and interventions, ordering and review of laboratory studies, ordering and review of radiographic studies, pulse oximetry and re-evaluation of patient's condition.   ____________________________________________   INITIAL IMPRESSION /  ASSESSMENT AND PLAN / ED COURSE  Pertinent labs & imaging results that were available during my care of the patient were reviewed by me and considered in my medical decision making (see chart for details).  Patient with increased work of breathing. Called respiratory to begin BiPAP.   ----------------------------------------- 4:09 PM on 11/29/2015 -----------------------------------------  Patient is tolerating the BiPAP well. Says it has improved her breathing. We listen to lungs now with the BiPAP on and the patient has asked for wheezing especially in the bases. We'll treat with methylprednisolone as well as do an abs. Because arrest or distress weight admit patient to the hospital. Unclear cause of wheezing at this time. Patient less likely with food to lungs with reassuring chest x-ray and a BNP only 186. Consider pulmonary Gentry Roch for the patient is not having any chest pain at this time even with deep breathing. Also she is not hypoxic or tachycardic. Will continue treatment for  bronchospasm. Signed out to Dr. Juleen China. The patient understands plan permission is willing to comply. She remains with good mentation.of note, patient does not a previous history of COPD.  ____________________________________________   FINAL CLINICAL IMPRESSION(S) / ED DIAGNOSES  Dyspnea. Bronchospasm.    Orbie Pyo, MD 11/29/15 [phone]

## 2015-11-29 NOTE — ED Notes (Addendum)
DIFFICULTY BREATHING , CHEST PAIN , ACCESSORY MUSCLE USE , EXTREMITY SWELLING , DRY COUGH

## 2015-11-30 LAB — COMPREHENSIVE METABOLIC PANEL
ALK PHOS: 74 U/L (ref 38–126)
ALT: 55 U/L — AB (ref 14–54)
AST: 80 U/L — AB (ref 15–41)
Albumin: 2.3 g/dL — ABNORMAL LOW (ref 3.5–5.0)
Anion gap: 7 (ref 5–15)
BUN: 14 mg/dL (ref 6–20)
CALCIUM: 8.4 mg/dL — AB (ref 8.9–10.3)
CHLORIDE: 110 mmol/L (ref 101–111)
CO2: 26 mmol/L (ref 22–32)
CREATININE: UNDETERMINED mg/dL (ref 0.44–1.00)
Glucose, Bld: 128 mg/dL — ABNORMAL HIGH (ref 65–99)
Potassium: 3.8 mmol/L (ref 3.5–5.1)
SODIUM: 143 mmol/L (ref 135–145)
Total Bilirubin: 28.2 mg/dL (ref 0.3–1.2)
Total Protein: 5.4 g/dL — ABNORMAL LOW (ref 6.5–8.1)

## 2015-11-30 LAB — CBC
HCT: 27.2 % — ABNORMAL LOW (ref 35.0–47.0)
Hemoglobin: 9 g/dL — ABNORMAL LOW (ref 12.0–16.0)
MCH: 33.1 pg (ref 26.0–34.0)
MCHC: 33.2 g/dL (ref 32.0–36.0)
MCV: 99.6 fL (ref 80.0–100.0)
PLATELETS: 75 10*3/uL — AB (ref 150–440)
RBC: 2.73 MIL/uL — ABNORMAL LOW (ref 3.80–5.20)
RDW: 28.5 % — AB (ref 11.5–14.5)
WBC: 15.1 10*3/uL — ABNORMAL HIGH (ref 3.6–11.0)

## 2015-11-30 LAB — AMMONIA: Ammonia: 44 umol/L — ABNORMAL HIGH (ref 9–35)

## 2015-11-30 MED ORDER — ADULT MULTIVITAMIN W/MINERALS CH
1.0000 | ORAL_TABLET | Freq: Every day | ORAL | Status: DC
  Administered 2015-11-30 – 2015-12-02 (×3): 1 via ORAL
  Filled 2015-11-30 (×3): qty 1

## 2015-11-30 MED ORDER — MORPHINE SULFATE (PF) 2 MG/ML IV SOLN
2.0000 mg | INTRAVENOUS | Status: DC | PRN
  Administered 2015-11-30 – 2015-12-02 (×4): 2 mg via INTRAVENOUS
  Filled 2015-11-30 (×4): qty 1

## 2015-11-30 MED ORDER — LEVOFLOXACIN 500 MG PO TABS
500.0000 mg | ORAL_TABLET | Freq: Every day | ORAL | Status: DC
  Administered 2015-11-30 – 2015-12-02 (×3): 500 mg via ORAL
  Filled 2015-11-30 (×3): qty 1

## 2015-11-30 NOTE — Progress Notes (Signed)
Pt cash($36), a money order, and 2 debit cards sent with security to be locked up.  Key and yellow sheet placed in the front of pt chart.

## 2015-11-30 NOTE — Progress Notes (Signed)
[facility]  ROUNDING NOTE   Subjective:  She was readmitted for Shortness of breath and placed on BiPAP Patient sitting up in bed eating breafast- clear liquids BUN down to 14 Creatinine still can't be calculated due to icterus. Potassium is normal  Objective:  Vital signs in last 24 hours:  Temp:  [97.7 F (36.5 C)-98.6 F (37 C)] 97.7 F (36.5 C) (12/10 0200) Pulse Rate:  [62-116] 69 (12/10 0738) Resp:  [18-35] 21 (12/10 0738) BP: (115-153)/(51-87) 135/77 mmHg (12/10 0700) SpO2:  [100 %] 100 % (12/10 0700) FiO2 (%):  [30 %-35 %] 30 % (12/10 0737) Weight:  [98.9 kg (218 lb 0.6 oz)-100.064 kg (220 lb 9.6 oz)] 98.9 kg (218 lb 0.6 oz) (12/09 1840)  Weight change:  Filed Weights   11/29/15 1326 11/29/15 1840  Weight: 100.064 kg (220 lb 9.6 oz) 98.9 kg (218 lb 0.6 oz)    Intake/Output: I/O last 3 completed shifts: In: 803 [P.O.:3; I.V.:600; IV Piggyback:200] Out: 400 [Urine:400]   Intake/Output this shift:     Physical Exam: General: NAD  Head: Normocephalic, atraumatic. Moist oral mucosal membranes  Eyes: Icterus noted  Neck: Supple, trachea midline  Lungs:  Clear to auscultation normal effort  Heart: Regular rate and rhythm  Abdomen:  Soft, nontender, BS present  Extremities:  no peripheral edema.  Neurologic: Nonfocal, moving all four extremities  Skin: No lesions       Basic Metabolic Panel:  Recent Labs Lab 11/29/15 1419 11/30/15 0713  NA 143 143  K 2.7* 3.8  CL 107 110  CO2 24 26  GLUCOSE 147* 128*  BUN 12 14  CREATININE UNABLE TO REPORT DUE TO ICTERUS  UNABLE TO REPORT DUE TO ICTERIC INTERFERENCE  CALCIUM 8.6* 8.4*  MG 1.2*  --     Liver Function Tests:  Recent Labs Lab 11/29/15 1419 11/30/15 0713  AST 83* 80*  ALT 69* 55*  ALKPHOS 84 74  BILITOT 29.8* 28.2*  PROT 6.5 5.4*  ALBUMIN 2.7* 2.3*   No results for input(s): LIPASE, AMYLASE in the last 168 hours.  Recent Labs Lab 11/30/15 0713  AMMONIA 44*     CBC:  Recent Labs Lab 11/29/15 1419 11/30/15 0713  WBC 12.9* 15.1*  HGB 9.7* 9.0*  HCT 29.9* 27.2*  MCV 102.1* 99.6  PLT 86* 75*    Cardiac Enzymes:  Recent Labs Lab 11/29/15 1419  TROPONINI 0.03    BNP: Invalid input(s): POCBNP  CBG: No results for input(s): GLUCAP in the last 168 hours.  Microbiology: Results for orders placed or performed during the hospital encounter of 11/29/15  MRSA PCR Screening     Status: Abnormal   Collection Time: 11/29/15  6:45 PM  Result Value Ref Range Status   MRSA by PCR POSITIVE (A) NEGATIVE Final    Comment:        The GeneXpert MRSA Assay (FDA approved for NASAL specimens only), is one component of a comprehensive MRSA colonization surveillance program. It is not intended to diagnose MRSA infection nor to guide or monitor treatment for MRSA infections. CRITICAL RESULT CALLED TO, READ BACK BY AND VERIFIED WITH: PAMELA ROMLEY ON 12/09/15 AT 2005PM BY TB.     Coagulation Studies:  Recent Labs  11/29/15 1419  LABPROT 18.3*  INR 1.51    Urinalysis: No results for input(s): COLORURINE, LABSPEC, PHURINE, GLUCOSEU, HGBUR, BILIRUBINUR, KETONESUR, PROTEINUR, UROBILINOGEN, NITRITE, LEUKOCYTESUR in the last 72 hours.  Invalid input(s): APPERANCEUR    Imaging: Dg Chest Portable 1  View  11/29/2015  CLINICAL DATA:  Difficulty breathing with chest pain. EXAM: PORTABLE CHEST 1 VIEW COMPARISON:  11/14/2015. FINDINGS: The heart size and mediastinal contours are within normal limits. Both lungs are clear. The visualized skeletal structures are unremarkable. Previous intramedullary rod for humerus fracture is stable. Compared with priors, mild bronchitic changes less well appreciated, likely secondary to portable technique today. IMPRESSION: No active disease.  Stable exam. Electronically Signed   By: Staci Righter M.D.   On: 11/29/2015 14:46     Medications:   . sodium chloride 50 mL/hr at 11/29/15 1800   . antiseptic oral  rinse  7 mL Mouth Rinse q12n4p  . azithromycin (ZITHROMAX) 500 MG IVPB  500 mg Intravenous Daily  . chlorhexidine  15 mL Mouth Rinse BID  . fluconazole  100 mg Oral Daily  . folic acid  1 mg Oral Daily  . heparin  5,000 Units Subcutaneous 3 times per day  . Influenza vac split quadrivalent PF  0.5 mL Intramuscular Tomorrow-1000  . ipratropium-albuterol  3 mL Nebulization Q4H  . lactulose  10 g Oral BID  . methylPREDNISolone (SOLU-MEDROL) injection  60 mg Intravenous Q6H  . multivitamin with minerals  1 tablet Oral Daily  . pantoprazole  40 mg Oral Daily  . pneumococcal 23 valent vaccine  0.5 mL Intramuscular Tomorrow-1000  . prednisoLONE  40 mg Oral QAC breakfast  . thiamine  100 mg Oral Daily   Or  . thiamine  100 mg Intravenous Daily   acetaminophen **OR** acetaminophen, LORazepam **OR** LORazepam  Assessment/ Plan:  49 y.o. female with a PMHx of hypertension, seizure disorder,alcohol abuse, substance abuse (cocaine) who was admitted to [facility] on 11/06/2015 for evaluation of abdominal pain, malaise, and body aches.  1. Acute renal failure. 2. Liver cirrhosis. 3. Hyperbilirubinemia. 4.  Shortness of breath  Plan: It appears that the patient's renal function continues to improve. BUN is down to 14 however we cannot calculate creatinine secondary to icterus and hyperbilirubinemia. Serum potassium is normal resp status has improved today. D/c supplemental iv fluids as patient is able to take PO   LOS: 1 Abigail Cook 12/10/20168:58 AM

## 2015-11-30 NOTE — Progress Notes (Signed)
[place] [facility] [place] NAME: Nailah Ocejo    MR#:  [id number]  DATE OF BIRTH:  [birth year]  SUBJECTIVE:  CHIEF COMPLAINT:   Chief Complaint  Patient presents with  . Respiratory Distress  . Chest Pain    came with severe SOB.   Required bipap initially, now on nasal canula oxygen, denies any complains, feels better.  REVIEW OF SYSTEMS:  CONSTITUTIONAL: No fever, fatigue or weakness.  EYES: No blurred or double vision.  EARS, NOSE, AND THROAT: No tinnitus or ear pain.  RESPIRATORY: No cough, shortness of breath, wheezing or hemoptysis.  CARDIOVASCULAR: No chest pain, orthopnea, edema.  GASTROINTESTINAL: No nausea, vomiting, diarrhea or abdominal pain.  GENITOURINARY: No dysuria, hematuria.  ENDOCRINE: No polyuria, nocturia,  HEMATOLOGY: No anemia, easy bruising or bleeding SKIN: No rash or lesion. MUSCULOSKELETAL: No joint pain or arthritis.   NEUROLOGIC: No tingling, numbness, weakness.  PSYCHIATRY: No anxiety or depression.   ROS  DRUG ALLERGIES:  No Known Allergies  VITALS:  Blood pressure 108/42, pulse 62, temperature 97.9 F (36.6 C), temperature source Oral, resp. rate 18, height 5\' 2"  (1.575 m), weight 99.791 kg (220 lb), SpO2 100 %.  PHYSICAL EXAMINATION:   GENERAL: 49 y.o.-year-old patientsitting up in bed,no acute distress EYES: Pupils equal, round, reactive to light and accommodation. No scleral icterus. Extraocular muscles intact.  HEENT: Head atraumatic, normocephalic. NECK: Supple, no jugular venous distention. No thyroid enlargement, no tenderness.  LUNGS: equal breathing b/l, some wheezing throughout all lung fields  CARDIOVASCULAR: distant S1, S2 normal. No murmurs, rubs, or gallops.  ABDOMEN: Soft, nontender, nondistended. Bowel sounds present. No organomegaly or mass.No guarding no rebound  EXTREMITIES:Trace bilateral pedal edema, cyanosis, or clubbing. Pulses 2+  NEUROLOGIC: Cranial nerves  II through XII aregrossly intact. Muscle strength 5/5 in all extremities. Sensation intact. Gait not checked.  PSYCHIATRIC: The patient is alert and oriented, follows commands SKIN: No obvious rash, lesion, or ulcer.   Physical Exam LABORATORY PANEL:   CBC  Recent Labs Lab 11/30/15 0713  WBC 15.1*  HGB 9.0*  HCT 27.2*  PLT 75*   ------------------------------------------------------------------------------------------------------------------  Chemistries   Recent Labs Lab 11/29/15 1419 11/30/15 0713  NA 143 143  K 2.7* 3.8  CL 107 110  CO2 24 26  GLUCOSE 147* 128*  BUN 12 14  CREATININE UNABLE TO REPORT DUE TO ICTERUS  UNABLE TO REPORT DUE TO ICTERIC INTERFERENCE  CALCIUM 8.6* 8.4*  MG 1.2*  --   AST 83* 80*  ALT 69* 55*  ALKPHOS 84 74  BILITOT 29.8* 28.2*   ------------------------------------------------------------------------------------------------------------------  Cardiac Enzymes  Recent Labs Lab 11/29/15 1419  TROPONINI 0.03   ------------------------------------------------------------------------------------------------------------------  RADIOLOGY:  Dg Chest Portable 1 View  11/29/2015  CLINICAL DATA:  Difficulty breathing with chest pain. EXAM: PORTABLE CHEST 1 VIEW COMPARISON:  11/14/2015. FINDINGS: The heart size and mediastinal contours are within normal limits. Both lungs are clear. The visualized skeletal structures are unremarkable. Previous intramedullary rod for humerus fracture is stable. Compared with priors, mild bronchitic changes less well appreciated, likely secondary to portable technique today. IMPRESSION: No active disease.  Stable exam. Electronically Signed   By: Staci Righter M.D.   On: 11/29/2015 14:46    ASSESSMENT AND PLAN:   Active Problems:   Acute respiratory failure ([place])  1. Acute respiratory failure: No hypoxia. She is a former smoker. Chest x-ray is clear.   COPD exacerbation. Continue with Solu-Medrol 60 mg  every 6 hours,  DuoNeb's every 4 hours, azithromycin. required BiPAP. Now on nasal canula oxygen- transfer to floor.  2. Hypokalemia: Replace. Check magnesium. Continue to monitor.  3. Kidney disease: Difficult to assess due to hyperbilirubinemia obscuring test results. She is a little hypernatremic today and has low sodium. Possibly dehydrated. We'll provide gentle hydration.appreciated Consult nephrology.  4. 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 He does not seem encephalopathic at this time. Continue prednisolone and lactulose. Continue Protonix and nadolol.  5. Anemia of chronic disease: Stable no bleeding noted  6. Recent history of heavy alcohol abuse: Patient reports that she has consumed any alcohol or cocaine since last admission. Check UDS. Monitor using CIWA scale.    All the records are reviewed and case discussed with Care Management/Social Workerr. Management plans discussed with the patient, family and they are in agreement.  CODE STATUS: Full  TOTAL TIME TAKING CARE OF THIS PATIENT: 35 minutes.     POSSIBLE D/C IN 2-3 DAYS, DEPENDING ON CLINICAL CONDITION.   Vaughan Basta M.D on 11/30/2015   Between 7am to 6pm - Pager - [phone]  After 6pm go to [email or web address] - password [email or web address] [facility]  [facility]  Office  [phone]  CC: Primary care physician; No PCP Per Patient  Note: This dictation was prepared with Dragon dictation along with smaller phrase technology. Any transcriptional errors that result from this process are unintentional.

## 2015-11-30 NOTE — Progress Notes (Signed)
GI Note:  Full note to follow.  Little change in cirrhosis.  T.bili unchanged from discharge in November.  No hydrothorax on CXR so likey no relation to the SOB she presented for.  No ascites on previous u/s.   No additional cirrhosis related changes at this time.   - Low Na diet, ETOH avoidance.  - Liver clinic follow up.

## 2015-12-01 MED ORDER — PREDNISONE 50 MG PO TABS
50.0000 mg | ORAL_TABLET | Freq: Every day | ORAL | Status: DC
  Filled 2015-12-01: qty 1

## 2015-12-01 NOTE — Progress Notes (Signed)
[facility]  ROUNDING NOTE   Subjective:  She was readmitted for Shortness of breath and placed on BiPAP Patient  Currently getting nebulizer treatment BUN down to 14 Creatinine still can't be calculated due to icterus. Potassium is normal  Objective:  Vital signs in last 24 hours:  Temp:  [97.6 F (36.4 C)-98.1 F (36.7 C)] 97.6 F (36.4 C) (12/11 0549) Pulse Rate:  [39-77] 54 (12/11 0549) Resp:  [17-26] 18 (12/11 0549) BP: (94-144)/(32-90) 94/32 mmHg (12/11 0549) SpO2:  [99 %-100 %] 100 % (12/11 1111) FiO2 (%):  [28 %] 28 % (12/10 1457) Weight:  [99.791 kg (220 lb)] 99.791 kg (220 lb) (12/10 1531)  Weight change: -0.272 kg (-9.6 oz) Filed Weights   11/29/15 1326 11/29/15 1840 11/30/15 1531  Weight: 100.064 kg (220 lb 9.6 oz) 98.9 kg (218 lb 0.6 oz) 99.791 kg (220 lb)    Intake/Output: I/O last 3 completed shifts: In: [id number] [P.O.:543; I.V.:550] Out: 1000 [Urine:1000]   Intake/Output this shift:  Total I/O In: 0  Out: 100 [Urine:100]  Physical Exam: General: NAD  Head: Normocephalic, atraumatic. Moist oral mucosal membranes  Eyes: Icterus noted  Neck: Supple, trachea midline  Lungs:  Mild scattered wheezing; normal effort  Heart: Regular rate and rhythm  Abdomen:  Soft, nontender, BS present  Extremities:  + peripheral edema.  Neurologic: Nonfocal, moving all four extremities  Skin: No lesions       Basic Metabolic Panel:  Recent Labs Lab 11/29/15 1419 11/30/15 0713  NA 143 143  K 2.7* 3.8  CL 107 110  CO2 24 26  GLUCOSE 147* 128*  BUN 12 14  CREATININE UNABLE TO REPORT DUE TO ICTERUS  UNABLE TO REPORT DUE TO ICTERIC INTERFERENCE  CALCIUM 8.6* 8.4*  MG 1.2*  --     Liver Function Tests:  Recent Labs Lab 11/29/15 1419 11/30/15 0713  AST 83* 80*  ALT 69* 55*  ALKPHOS 84 74  BILITOT 29.8* 28.2*  PROT 6.5 5.4*  ALBUMIN 2.7* 2.3*   No results for input(s): LIPASE, AMYLASE in the last 168 hours.  Recent Labs Lab  11/30/15 0713  AMMONIA 44*    CBC:  Recent Labs Lab 11/29/15 1419 11/30/15 0713  WBC 12.9* 15.1*  HGB 9.7* 9.0*  HCT 29.9* 27.2*  MCV 102.1* 99.6  PLT 86* 75*    Cardiac Enzymes:  Recent Labs Lab 11/29/15 1419  TROPONINI 0.03    BNP: Invalid input(s): POCBNP  CBG: No results for input(s): GLUCAP in the last 168 hours.  Microbiology: Results for orders placed or performed during the hospital encounter of 11/29/15  MRSA PCR Screening     Status: Abnormal   Collection Time: 11/29/15  6:45 PM  Result Value Ref Range Status   MRSA by PCR POSITIVE (A) NEGATIVE Final    Comment:        The GeneXpert MRSA Assay (FDA approved for NASAL specimens only), is one component of a comprehensive MRSA colonization surveillance program. It is not intended to diagnose MRSA infection nor to guide or monitor treatment for MRSA infections. CRITICAL RESULT CALLED TO, READ BACK BY AND VERIFIED WITH: PAMELA ROMLEY ON 12/09/15 AT 2005PM BY TB.     Coagulation Studies:  Recent Labs  11/29/15 1419  LABPROT 18.3*  INR 1.51    Urinalysis: No results for input(s): COLORURINE, LABSPEC, PHURINE, GLUCOSEU, HGBUR, BILIRUBINUR, KETONESUR, PROTEINUR, UROBILINOGEN, NITRITE, LEUKOCYTESUR in the last 72 hours.  Invalid input(s): APPERANCEUR    Imaging: Dg Chest Portable  1 View  11/29/2015  CLINICAL DATA:  Difficulty breathing with chest pain. EXAM: PORTABLE CHEST 1 VIEW COMPARISON:  11/14/2015. FINDINGS: The heart size and mediastinal contours are within normal limits. Both lungs are clear. The visualized skeletal structures are unremarkable. Previous intramedullary rod for humerus fracture is stable. Compared with priors, mild bronchitic changes less well appreciated, likely secondary to portable technique today. IMPRESSION: No active disease.  Stable exam. Electronically Signed   By: Staci Righter M.D.   On: 11/29/2015 14:46     Medications:     . antiseptic oral rinse  7 mL  Mouth Rinse q12n4p  . chlorhexidine  15 mL Mouth Rinse BID  . fluconazole  100 mg Oral Daily  . folic acid  1 mg Oral Daily  . heparin  5,000 Units Subcutaneous 3 times per day  . ipratropium-albuterol  3 mL Nebulization Q4H  . lactulose  10 g Oral BID  . levofloxacin  500 mg Oral Daily  . methylPREDNISolone (SOLU-MEDROL) injection  60 mg Intravenous Q6H  . multivitamin with minerals  1 tablet Oral Daily  . pantoprazole  40 mg Oral Daily  . prednisoLONE  40 mg Oral QAC breakfast  . thiamine  100 mg Oral Daily   Or  . thiamine  100 mg Intravenous Daily   acetaminophen **OR** acetaminophen, LORazepam **OR** LORazepam, morphine injection  Assessment/ Plan:  49 y.o. female with a PMHx of hypertension, seizure disorder,alcohol abuse, substance abuse (cocaine) who was admitted to [facility] on 11/06/2015 for evaluation of abdominal pain, malaise, and body aches.  1. Acute renal failure. 2. Liver cirrhosis. 3. Hyperbilirubinemia. 4.  Shortness of breath  Plan: It appears that the patient's renal function continues to improve. BUN is down to 14 however creatinine cannot be calculated secondary to icterus and hyperbilirubinemia. Serum potassium is normal resp status has improved today. D/c supplemental iv fluids as patient is able to take PO   LOS: 2 Dawnya Grams 12/11/201612:55 PM

## 2015-12-01 NOTE — Consult Note (Signed)
GI Inpatient Consult Note  Reason for Consult: cirrhosis   Attending Requesting Consult: Anselm Jungling  History of Present Illness: Abigail Cook is a 49 y.o. 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  Required cpap and ICU stay. Improving some now. Etiology of hypoxia, sob uncleare thought to be COPD exac.   In terms of cirrhosis, she denies any new symptoms.  No abd swelling, LE swelling, rectal bleeding, melena, confusion, rash. Jaundice is stable.  No f/c.Marland Kitchen  Says avoiding Etoh since last admission and UDS is negative   T.bili unchanged from previous admission. INR stable at 1.5  HepC viral load is positive. On nadolol and lactulose.   U/s not done this admission but no ascites on prev u/s 10/2015.  CXR this admission with no evidence of pleural effusion.    Past Medical History:  Past Medical History  Diagnosis Date  . Hypertension   . Seizures ([place])   . Chronic kidney disease     Problem List: Patient Active Problem List   Diagnosis Date Noted  . Acute respiratory failure ([place]) 11/29/2015  . Pressure ulcer 11/19/2015  . Hyperbilirubinemia   . Alcoholic cirrhosis of liver with ascites ([place])   . Hepatic encephalopathy ([place]) 11/06/2015    Past Surgical History: Past Surgical History  Procedure Laterality Date  . Rod placed in right leg/foot    . Esophagogastroduodenoscopy Left 11/18/2015    Procedure: ESOPHAGOGASTRODUODENOSCOPY (EGD);  Surgeon: Hulen Luster, MD;  Location: [facility] ENDOSCOPY;  Service: Endoscopy;  Laterality: Left;    Allergies: No Known Allergies  Home Medications: Prescriptions prior to admission  Medication Sig Dispense Refill Last Dose  . fluconazole (DIFLUCAN) 100 MG tablet Take 1 tablet (100 mg total) by mouth daily. 7 tablet 0 11/28/2015 at Unknown time  . lactulose (CHRONULAC) 10 GM/15ML solution Take 15 mLs (10 g total) by mouth 2 (two) times daily. 240 mL 0 11/28/2015 at unknown  . pantoprazole  (PROTONIX) 40 MG tablet Take 1 tablet (40 mg total) by mouth daily. 30 tablet 0 11/29/2015 at Unknown time  . prednisoLONE (PRELONE) 15 MG/5ML SOLN Take 13.3 mLs (40 mg total) by mouth daily before breakfast. 450 mL 0 11/29/2015 at Unknown time  . nadolol (CORGARD) 20 MG tablet Take 1 tablet (20 mg total) by mouth daily. (Patient not taking: Reported on 11/29/2015) 30 tablet 0 Not Taking at Unknown time   Home medication reconciliation was completed with the patient.   Scheduled Inpatient Medications:   . antiseptic oral rinse  7 mL Mouth Rinse q12n4p  . chlorhexidine  15 mL Mouth Rinse BID  . fluconazole  100 mg Oral Daily  . folic acid  1 mg Oral Daily  . heparin  5,000 Units Subcutaneous 3 times per day  . ipratropium-albuterol  3 mL Nebulization Q4H  . lactulose  10 g Oral BID  . levofloxacin  500 mg Oral Daily  . methylPREDNISolone (SOLU-MEDROL) injection  60 mg Intravenous Q6H  . multivitamin with minerals  1 tablet Oral Daily  . pantoprazole  40 mg Oral Daily  . prednisoLONE  40 mg Oral QAC breakfast  . thiamine  100 mg Oral Daily   Or  . thiamine  100 mg Intravenous Daily    Continuous Inpatient Infusions:     PRN Inpatient Medications:  acetaminophen **OR** acetaminophen, LORazepam **OR** LORazepam, morphine injection  Family History: The patient's family history is negative for inflammatory bowel disorders, GI malignancy, or solid  organ transplantation.  Social History:   reports that she has quit smoking. She does not have any smokeless tobacco history on file. She reports that she drinks about 7.2 oz of alcohol per week.   Review of Systems: Constitutional: Weight is stable.  Eyes: No changes in vision. ENT: No oral lesions, sore throat.  GI: see HPI.  Heme/Lymph: + easy bruising.  CV: No chest pain.  GU: No hematuria.  Integumentary: No rashes.  Neuro: No headaches.  Psych: No depression/anxiety.  Endocrine: No heat/cold intolerance.  Allergic/Immunologic:  No urticaria.  Resp: No cough, + SOB.  Musculoskeletal: No joint swelling.    Physical Examination: BP 94/32 mmHg  Pulse 54  Temp(Src) 97.6 F (36.4 C) (Oral)  Resp 18  Ht 5\' 2"  (1.575 m)  Wt 99.791 kg (220 lb)  BMI 40.23 kg/m2  SpO2 100% Gen: NAD, alert and oriented x 4 HEENT: PEERLA, EOMI, Neck: supple, no JVD or thyromegaly Chest: +coarse bilaterally, no wheezes, crackles, or other adventitious sounds CV: RRR, no m/g/c/r Abd: soft, NT, ND, +BS in all four quadrants; no HSM, guarding, ridigity, or rebound tenderness Ext: no edema, well perfused with 2+ pulses, Skin: no rash or lesions noted Lymph: no LAD  Data: Lab Results  Component Value Date   WBC 15.1* 11/30/2015   HGB 9.0* 11/30/2015   HCT 27.2* 11/30/2015   MCV 99.6 11/30/2015   PLT 75* 11/30/2015    Recent Labs Lab 11/29/15 1419 11/30/15 0713  HGB 9.7* 9.0*   Lab Results  Component Value Date   NA 143 11/30/2015   K 3.8 11/30/2015   CL 110 11/30/2015   CO2 26 11/30/2015   BUN 14 11/30/2015   CREATININE  UNABLE TO REPORT DUE TO ICTERIC INTERFERENCE 11/30/2015   Lab Results  Component Value Date   ALT 55* 11/30/2015   AST 80* 11/30/2015   ALKPHOS 74 11/30/2015   BILITOT 28.2* 11/30/2015    Recent Labs Lab 11/29/15 1419  APTT 38*  INR 1.51   Assessment/Plan: Abigail Cook is a 49 y.o. female with ETOH and hepC cirrhosis now a/w SOB thought due to COPD exac.  Cirrhosis unchanged since prev admission 10/2015.  MELD is  Very high but cannot calculate due to creat not reported.  Very high one year mortality.  Not transplant candidate due to recent ETOH and drug use.   Recommendations: - cont nadolol, titrate to HR 60 - cont lactulose, titrate to 2 stool per day - no indication for diuretics currently - ETOH and drug avoidance - outpt f/u for possible HepC Rx, possible transplant eval if she can stay away from ETOH and drugs and attend rehab. Would like be better served at tertiary center due to  decompensate liver disease with high MELD.   Thank you for the consult. Please call with questions or concerns.  Deasia Chiu, Grace Blight, MD

## 2015-12-01 NOTE — Progress Notes (Signed)
   12/01/15 1215  Clinical Encounter Type  Visited With Patient  Visit Type Initial;Spiritual support  Referral From Nurse  Consult/Referral To Chaplain  Spiritual Encounters  Spiritual Needs Prayer;Emotional  Visited with PT. Req. Prayer, given same. Burley Saver [phone]

## 2015-12-01 NOTE — Progress Notes (Signed)
[place] [facility] [place] NAME: Abigail Cook    MR#:  [id number]  DATE OF BIRTH:  1966-01-31  SUBJECTIVE:  CHIEF COMPLAINT:   Chief Complaint  Patient presents with  . Respiratory Distress  . Chest Pain    came with severe SOB.   Required bipap initially, now on nasal canula oxygen, denies any complains, feels better. No pain. Tolerated diet.  REVIEW OF SYSTEMS:  CONSTITUTIONAL: No fever, fatigue or weakness.  EYES: No blurred or double vision.  EARS, NOSE, AND THROAT: No tinnitus or ear pain.  RESPIRATORY: No cough, shortness of breath, wheezing or hemoptysis.  CARDIOVASCULAR: No chest pain, orthopnea, edema.  GASTROINTESTINAL: No nausea, vomiting, diarrhea or abdominal pain.  GENITOURINARY: No dysuria, hematuria.  ENDOCRINE: No polyuria, nocturia,  HEMATOLOGY: No anemia, easy bruising or bleeding SKIN: No rash or lesion. MUSCULOSKELETAL: No joint pain or arthritis.   NEUROLOGIC: No tingling, numbness, weakness.  PSYCHIATRY: No anxiety or depression.   ROS  DRUG ALLERGIES:  No Known Allergies  VITALS:  Blood pressure 122/54, pulse 81, temperature 98.2 F (36.8 C), temperature source Oral, resp. rate 16, height 5\' 2"  (1.575 m), weight 99.791 kg (220 lb), SpO2 100 %.  PHYSICAL EXAMINATION:   GENERAL: 49 y.o.-year-old patientsitting up in bed,no acute distress EYES: Pupils equal, round, reactive to light and accommodation. No scleral icterus. Extraocular muscles intact.  HEENT: Head atraumatic, normocephalic. NECK: Supple, no jugular venous distention. No thyroid enlargement, no tenderness.  LUNGS: equal breathing b/l, some wheezing throughout all lung fields  CARDIOVASCULAR: distant S1, S2 normal. No murmurs, rubs, or gallops.  ABDOMEN: Soft, nontender, nondistended. Bowel sounds present. No organomegaly or mass.No guarding no rebound  EXTREMITIES:Trace bilateral pedal edema, cyanosis, or clubbing. Pulses 2+   NEUROLOGIC: Cranial nerves II through XII aregrossly intact. Muscle strength 5/5 in all extremities. Sensation intact. Gait not checked.  PSYCHIATRIC: The patient is alert and oriented, follows commands SKIN: No obvious rash, lesion, or ulcer.   Physical Exam LABORATORY PANEL:   CBC  Recent Labs Lab 11/30/15 0713  WBC 15.1*  HGB 9.0*  HCT 27.2*  PLT 75*   ------------------------------------------------------------------------------------------------------------------  Chemistries   Recent Labs Lab 11/29/15 1419 11/30/15 0713  NA 143 143  K 2.7* 3.8  CL 107 110  CO2 24 26  GLUCOSE 147* 128*  BUN 12 14  CREATININE UNABLE TO REPORT DUE TO ICTERUS  UNABLE TO REPORT DUE TO ICTERIC INTERFERENCE  CALCIUM 8.6* 8.4*  MG 1.2*  --   AST 83* 80*  ALT 69* 55*  ALKPHOS 84 74  BILITOT 29.8* 28.2*   ------------------------------------------------------------------------------------------------------------------  Cardiac Enzymes  Recent Labs Lab 11/29/15 1419  TROPONINI 0.03   ------------------------------------------------------------------------------------------------------------------  RADIOLOGY:  No results found.  ASSESSMENT AND PLAN:   Active Problems:   Acute respiratory failure ([place])  1. Acute respiratory failure: No hypoxia. She is a former smoker. Chest x-ray is clear.   COPD exacerbation.  Given Solu-Medrol - now switched to oral., DuoNeb's every 4 hours, azithromycin- changed to levaquin due to jaundice. required BiPAP. Now on nasal canula oxygen-   2. Hypokalemia: Replace. Check magnesium. Continue to monitor.  3. Kidney disease: Difficult to assess due to hyperbilirubinemia obscuring test results.   Possibly dehydrated.  gentle hydration.appreciated Consult nephrology.  good urine output.  4. 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 Continue  prednisolone and lactulose. Continue Protonix and nadolol.  this is a stable issue in this admission.  5. Anemia of chronic disease: Stable no bleeding noted  6. Recent history of heavy alcohol abuse: Patient reports that she has consumed any alcohol or cocaine since last admission. Check UDS. Monitor using CIWA scale.    All the records are reviewed and case discussed with Care Management/Social Workerr. Management plans discussed with the patient, family and they are in agreement.  CODE STATUS: Full  TOTAL TIME TAKING CARE OF THIS PATIENT: 35 minutes.     POSSIBLE D/C IN 1-2 DAYS, DEPENDING ON CLINICAL CONDITION.   Vaughan Basta M.D on 12/01/2015   Between 7am to 6pm - Pager - [phone]  After 6pm go to [email or web address] - password [email or web address] [facility]  [facility]  Office  [phone]  CC: Primary care physician; No PCP Per Patient  Note: This dictation was prepared with Dragon dictation along with smaller phrase technology. Any transcriptional errors that result from this process are unintentional.

## 2015-12-02 LAB — CBC
HCT: 27.8 % — ABNORMAL LOW (ref 35.0–47.0)
HEMOGLOBIN: 9.3 g/dL — AB (ref 12.0–16.0)
MCH: 33.6 pg (ref 26.0–34.0)
MCHC: 33.5 g/dL (ref 32.0–36.0)
MCV: 100.4 fL — ABNORMAL HIGH (ref 80.0–100.0)
Platelets: 89 10*3/uL — ABNORMAL LOW (ref 150–440)
RBC: 2.77 MIL/uL — ABNORMAL LOW (ref 3.80–5.20)
RDW: 28.6 % — ABNORMAL HIGH (ref 11.5–14.5)
WBC: 12.3 10*3/uL — ABNORMAL HIGH (ref 3.6–11.0)

## 2015-12-02 LAB — COMPREHENSIVE METABOLIC PANEL
ALBUMIN: 2.2 g/dL — AB (ref 3.5–5.0)
ALK PHOS: 103 U/L (ref 38–126)
ALT: 69 U/L — AB (ref 14–54)
AST: 86 U/L — AB (ref 15–41)
Anion gap: 7 (ref 5–15)
BILIRUBIN TOTAL: 25.3 mg/dL — AB (ref 0.3–1.2)
BUN: 22 mg/dL — AB (ref 6–20)
CALCIUM: 8.6 mg/dL — AB (ref 8.9–10.3)
CO2: 25 mmol/L (ref 22–32)
Chloride: 107 mmol/L (ref 101–111)
Creatinine, Ser: UNDETERMINED mg/dL (ref 0.44–1.00)
GLUCOSE: 139 mg/dL — AB (ref 65–99)
Potassium: 3.2 mmol/L — ABNORMAL LOW (ref 3.5–5.1)
Sodium: 139 mmol/L (ref 135–145)
TOTAL PROTEIN: 5.3 g/dL — AB (ref 6.5–8.1)

## 2015-12-02 MED ORDER — MAGNESIUM SULFATE 2 GM/50ML IV SOLN
2.0000 g | Freq: Once | INTRAVENOUS | Status: AC
  Administered 2015-12-02: 2 g via INTRAVENOUS
  Filled 2015-12-02: qty 50

## 2015-12-02 MED ORDER — ADULT MULTIVITAMIN W/MINERALS CH: 1.0000 | ORAL_TABLET | Freq: Every day | ORAL | Status: DC

## 2015-12-02 MED ORDER — POTASSIUM CHLORIDE 20 MEQ PO PACK
40.0000 meq | PACK | Freq: Two times a day (BID) | ORAL | Status: DC
  Administered 2015-12-02: 40 meq via ORAL
  Filled 2015-12-02: qty 2

## 2015-12-02 MED ORDER — LEVOFLOXACIN 500 MG PO TABS: 500.0000 mg | ORAL_TABLET | Freq: Every day | ORAL | Status: DC

## 2015-12-02 MED ORDER — PANTOPRAZOLE SODIUM 40 MG PO TBEC: 40.0000 mg | DELAYED_RELEASE_TABLET | Freq: Every day | ORAL | Status: DC

## 2015-12-02 MED ORDER — PREDNISONE 50 MG PO TABS: 50.0000 mg | ORAL_TABLET | Freq: Every day | ORAL | Status: DC

## 2015-12-02 MED ORDER — NADOLOL 20 MG PO TABS: 20.0000 mg | ORAL_TABLET | Freq: Every day | ORAL | Status: DC

## 2015-12-02 MED ORDER — LACTULOSE 10 GM/15ML PO SOLN: 10.0000 g | Freq: Two times a day (BID) | ORAL | Status: DC

## 2015-12-02 MED ORDER — CHLORHEXIDINE GLUCONATE CLOTH 2 % EX PADS
6.0000 | MEDICATED_PAD | Freq: Every day | CUTANEOUS | Status: DC
  Administered 2015-12-02: 6 via TOPICAL

## 2015-12-02 MED ORDER — MUPIROCIN 2 % EX OINT
1.0000 "application " | TOPICAL_OINTMENT | Freq: Two times a day (BID) | CUTANEOUS | Status: DC
  Administered 2015-12-02: 1 via NASAL
  Filled 2015-12-02: qty 22

## 2015-12-02 MED ORDER — ALBUTEROL SULFATE HFA 108 (90 BASE) MCG/ACT IN AERS: 2.0000 | INHALATION_SPRAY | Freq: Four times a day (QID) | RESPIRATORY_TRACT | Status: DC | PRN

## 2015-12-02 NOTE — Discharge Summary (Signed)
[place] at [place] NAME: Abigail Cook    MR#:  [id number]  DATE OF BIRTH:  11-26-1966  DATE OF ADMISSION:  11/29/2015 ADMITTING PHYSICIAN: Aldean Jewett, MD  DATE OF DISCHARGE: 12/02/2015  PRIMARY CARE PHYSICIAN: No PCP Per Patient    ADMISSION DIAGNOSIS:  Bronchospasm [J98.01] Dyspnea [R06.00]  DISCHARGE DIAGNOSIS:  Active Problems:   Acute respiratory failure ([place])   SECONDARY DIAGNOSIS:   Past Medical History  Diagnosis Date  . Hypertension   . Seizures ([place])   . Chronic kidney disease     HOSPITAL COURSE:   1. Acute respiratory failure: No hypoxia. She is a former smoker. Chest x-ray is clear.  COPD exacerbation.  Given Solu-Medrol - now switched to oral., DuoNeb's every 4 hours, azithromycin- changed to levaquin due to jaundice. required BiPAP.   on room air now.   Will give prescriptions to go home with, as she does not have a PCP currently.  2. Hypokalemia: Replace. Check magnesium. Continue to monitor.  3. Kidney disease: Difficult to assess due to hyperbilirubinemia obscuring test results.  Possibly dehydrated. gentle hydration.appreciated Consult nephrology. good urine output.  4. 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 Continue prednisolone and lactulose. Continue Protonix and nadolol. this is a stable issue in this admission.   Given prescriptions for prednisone, lactulose and nadolol on discharge.   Called hospice consult to arrange services at home after discharge.  5. Anemia of chronic disease: Stable no bleeding noted  6. Recent history of heavy alcohol abuse: Patient reports that she has consumed any alcohol or cocaine since last admission. Check UDS. Monitor using CIWA scale. o signs of withdrawal.  7. Abdominal pain   She had sharp  abdominal pain last night- lasted for 1-2 min, she said- she frequently have this type of pain due to her liver.    Ordered US abdomen for that last night, but she is pain free, tolerating diet well now, and want to go home- as that periodic pain is not new for her. Cancel US abdomen.  DISCHARGE CONDITIONS:   Stable.  CONSULTS OBTAINED:  Treatment Team:  Murlean Iba, MD  DRUG ALLERGIES:  No Known Allergies  DISCHARGE MEDICATIONS:   Current Discharge Medication List    START taking these medications   Details  albuterol (PROVENTIL HFA;VENTOLIN HFA) 108 (90 BASE) MCG/ACT inhaler Inhale 2 puffs into the lungs every 6 (six) hours as needed for wheezing or shortness of breath. Qty: 1 Inhaler, Refills: 2    levofloxacin (LEVAQUIN) 500 MG tablet Take 1 tablet (500 mg total) by mouth daily. Qty: 3 tablet, Refills: 0    Multiple Vitamin (MULTIVITAMIN WITH MINERALS) TABS tablet Take 1 tablet by mouth daily. Qty: 30 tablet, Refills: 1    predniSONE (DELTASONE) 50 MG tablet Take 1 tablet (50 mg total) by mouth daily with breakfast. Qty: 30 tablet, Refills: 0      CONTINUE these medications which have CHANGED   Details  lactulose (CHRONULAC) 10 GM/15ML solution Take 15 mLs (10 g total) by mouth 2 (two) times daily. Qty: 240 mL, Refills: 2    nadolol (CORGARD) 20 MG tablet Take 1 tablet (20 mg total) by mouth daily. Qty: 30 tablet, Refills: 1    pantoprazole (PROTONIX) 40 MG tablet Take 1 tablet (40 mg total) by mouth daily. Qty: 30 tablet, Refills: 0  STOP taking these medications     fluconazole (DIFLUCAN) 100 MG tablet      prednisoLONE (PRELONE) 15 MG/5ML SOLN          DISCHARGE INSTRUCTIONS:    Follow with PMD in 1-2 weeks.  If you experience worsening of your admission symptoms, develop shortness of breath, life threatening emergency, suicidal or homicidal thoughts you must seek medical attention immediately by calling 911 or calling your MD immediately  if  symptoms less severe.  You Must read complete instructions/literature along with all the possible adverse reactions/side effects for all the Medicines you take and that have been prescribed to you. Take any new Medicines after you have completely understood and accept all the possible adverse reactions/side effects.   Please note  You were cared for by a hospitalist during your hospital stay. If you have any questions about your discharge medications or the care you received while you were in the hospital after you are discharged, you can call the unit and asked to speak with the hospitalist on call if the hospitalist that took care of you is not available. Once you are discharged, your primary care physician will handle any further medical issues. Please note that NO REFILLS for any discharge medications will be authorized once you are discharged, as it is imperative that you return to your primary care physician (or establish a relationship with a primary care physician if you do not have one) for your aftercare needs so that they can reassess your need for medications and monitor your lab values.    Today   CHIEF COMPLAINT:   Chief Complaint  Patient presents with  . Respiratory Distress  . Chest Pain    HISTORY OF PRESENT ILLNESS:  Abigail Cook  is a 49 y.o. 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 She states that she has been short of breath for 2-3 days getting much worse. Today she was so short of breath at rest that she came to the ED. She has not been able to get in with a primary care provider since her discharge. She does not have a known history of COPD or asthma but she is a former smoker. She states that she has not been using any  inhalants including cigarettes or cocaine this week. She has not had fevers, chills, congestion. She has had a dry cough. Her chest x-ray is clear. She is oxygenating well. She is having significant work of breathing and wheezing and has been placed on BiPAP in the emergency room.    VITAL SIGNS:  Blood pressure 139/58, pulse 62, temperature 98.3 F (36.8 C), temperature source Oral, resp. rate 18, height 5\' 2"  (1.575 m), weight 99.791 kg (220 lb), SpO2 99 %.  I/O:   Intake/Output Summary (Last 24 hours) at 12/02/15 1252 Last data filed at 12/02/15 1237  Gross per 24 hour  Intake    300 ml  Output    550 ml  Net   -250 ml    PHYSICAL EXAMINATION:   GENERAL: 49 y.o.-year-old patientsitting up in bed,no acute distress EYES: Pupils equal, round, reactive to light and accommodation. No scleral icterus. Extraocular muscles intact.  HEENT: Head atraumatic, normocephalic. NECK: Supple, no jugular venous distention. No thyroid enlargement, no tenderness.  LUNGS: equal breathing  b/l, some wheezing throughout all lung fields  CARDIOVASCULAR: distant S1, S2 normal. No murmurs, rubs, or gallops.  ABDOMEN: Soft, nontender, nondistended. Bowel sounds present. No organomegaly or mass.No guarding no rebound  EXTREMITIES:Trace bilateral pedal edema, cyanosis, or clubbing. Pulses 2+  NEUROLOGIC: Cranial nerves II through XII aregrossly intact. Muscle strength 5/5 in all extremities. Sensation intact. Gait not checked.  PSYCHIATRIC: The patient is alert and oriented, follows commands SKIN: No obvious rash, lesion, or ulcer.   DATA REVIEW:   CBC  Recent Labs Lab 12/02/15 0541  WBC 12.3*  HGB 9.3*  HCT 27.8*  PLT 89*    Chemistries   Recent Labs Lab 11/29/15 1419  12/02/15 0541  NA 143  < > 139  K 2.7*  < > 3.2*  CL 107  < > 107  CO2 24  < > 25  GLUCOSE 147*  < > 139*  BUN 12  < > 22*  CREATININE UNABLE TO REPORT DUE TO ICTERUS  < > UNABLE TO REPORT DUE TO ICTERUS   CALCIUM 8.6*  < > 8.6*  MG 1.2*  --   --   AST 83*  < > 86*  ALT 69*  < > 69*  ALKPHOS 84  < > 103  BILITOT 29.8*  < > 25.3*  < > = values in this interval not displayed.  Cardiac Enzymes  Recent Labs Lab 11/29/15 1419  TROPONINI 0.03    Microbiology Results  Results for orders placed or performed during the hospital encounter of 11/29/15  MRSA PCR Screening     Status: Abnormal   Collection Time: 11/29/15  6:45 PM  Result Value Ref Range Status   MRSA by PCR POSITIVE (A) NEGATIVE Final    Comment:        The GeneXpert MRSA Assay (FDA approved for NASAL specimens only), is one component of a comprehensive MRSA colonization surveillance program. It is not intended to diagnose MRSA infection nor to guide or monitor treatment for MRSA infections. CRITICAL RESULT CALLED TO, READ BACK BY AND VERIFIED WITH: PAMELA ROMLEY ON 12/09/15 AT 2005PM BY TB.     RADIOLOGY:  No results found.    Management plans discussed with the patient, family and they are in agreement.  CODE STATUS:     Code Status Orders        Start     Ordered   11/29/15 1838  Full code   Continuous     11/29/15 1837      TOTAL TIME TAKING CARE OF THIS PATIENT: 35 minutes.    Vaughan Basta M.D on 12/02/2015 at 12:52 PM  Between 7am to 6pm - Pager - [phone]  After 6pm go to [email or web address] - password [email or web address] [facility]  [place] [facility]  Office  [phone]  CC: Primary care physician; No PCP Per Patient   Note: This dictation was prepared with Dragon dictation along with smaller phrase technology. Any transcriptional errors that result from this process are unintentional.

## 2015-12-02 NOTE — Care Management (Signed)
Patient was discharged previously with [facility] and follow up appointment with Dr Marijo File as a new PCP on the 9th of Jan. Unfortunately, patient was unable to attend this appointment due coming to the ED for SOB and being admitted. Contacted Kara at [facility] to inform of patient admission. Discussed case. Patient will not be able to get services until she attends intial PCP appointment. Will contact Dr Marylou Mccoy to inform of patient admission in hopes that Dr Marylou Mccoy will be able to accept patient back. Spoke with Dr Anselm Jungling concerning case. WIll place pallative consult.

## 2015-12-02 NOTE — Care Management (Signed)
Spoke with Olivia Mackie in billing at [employer] . Told her about patient medicaid card and upcoming appointment. Explained to tracy that Rose with our [facility] department had recommended that [facility] call [facility] in [place] ad request and override so that patient would be seen and that she would take care of the issue. Contacted Megan at [facility] in [place] and informed her that she would likely get a call from [employer] for and override. Patient will discharge today and will be transported by Azucena Freed at [facility].

## 2015-12-02 NOTE — Progress Notes (Signed)
Notified Dr. Anselm Jungling of critical total billirubin of 25.3

## 2015-12-02 NOTE — Progress Notes (Signed)
Pt A and O x 4. VSS. Pt tolerating diet well. No complaints of pain or nausea. IV removed intact, prescriptions given. Pt had hospice nurse to come visit and they will follow-up with patient at home. Pt voiced understanding of discharge instructions with no further questions. Pt discharged via wheelchair with axillary.

## 2015-12-02 NOTE — Evaluation (Signed)
Physical Therapy Evaluation Patient Details Name: Abigail Cook MRN: [id number] DOB: [birth year] Today's Date: 12/02/2015   History of Present Illness  presents to [facility] secondary to acute respiratory failure. Pt with history of cirrhosis secondary to alcohol abuse, cocaine, and Hep C. Pt with complaints of respiratroy distress and chest pain. Pt with recent admission on 11/16-11/30 for hepatic encephalopathy.  Clinical Impression  Pt is a pleasant 49 year old female who was admitted for acute respiratory failure. Pt performs bed mobility/transfers with mod I, and ambulation with rw and cga. Pt demonstrates increased safety awareness this admission than previously. Pt reports she has good support of family at home, however is concerned about the 17 steps she has to enter as that was difficult. Pt demonstrates deficits with strength/balance/mobility. Would benefit from skilled PT to address above deficits and promote optimal return to PLOF. Pt is motivated to perform therapy.      Follow Up Recommendations Home health PT;Supervision - Intermittent    Equipment Recommendations       Recommendations for Other Services       Precautions / Restrictions Precautions Precautions: Fall Restrictions Weight Bearing Restrictions: No      Mobility  Bed Mobility Overal bed mobility: Modified Independent Bed Mobility: Supine to Sit     Supine to sit: Modified independent (Device/Increase time)     General bed mobility comments: Mod I performed for bed mobility. Safe technique performed.  Transfers Overall transfer level: Modified independent Equipment used: Rolling walker (2 wheeled) Transfers: Sit to/from Stand Sit to Stand: Modified independent (Device/Increase time)         General transfer comment: cues for using rw, however pt not as impulsive as previous admissions. Safe technique performed  Ambulation/Gait Ambulation/Gait assistance: Min guard Ambulation Distance (Feet):  120 Feet Assistive device: Rolling walker (2 wheeled) Gait Pattern/deviations: Step-through pattern   Gait velocity interpretation: Below normal speed for age/gender General Gait Details: ambulated twice in room and then out in hall with isolation gown donned. CGA used. Pt with safe technique with reciprocal gait pattern. Pt aware of her respiratory status and knows when to turn around due to SOB symptoms.  Stairs            Wheelchair Mobility    Modified Rankin (Stroke Patients Only)       Balance Overall balance assessment: Modified Independent Sitting-balance support: Feet supported Sitting balance-Leahy Scale: Good Sitting balance - Comments: No balance deficits noted in sitting.      Standing balance-Leahy Scale: Good Standing balance comment: with rw                             Pertinent Vitals/Pain Pain Assessment: No/denies pain    Home Living Family/patient expects to be discharged to:: Private residence Living Arrangements: Children Available Help at Discharge: Family Type of Home: Apartment Home Access: Stairs to enter   Technical brewer of Steps: 17 Home Layout: Two level Home Equipment: Environmental consultant - 2 wheels      Prior Function Level of Independence: Independent with assistive device(s)         Comments: Patient denies any falls recently with a RW     Hand Dominance        Extremity/Trunk Assessment   Upper Extremity Assessment: Overall WFL for tasks assessed           Lower Extremity Assessment: Overall WFL for tasks assessed  Communication   Communication: No difficulties  Cognition Arousal/Alertness: Awake/alert Behavior During Therapy: WFL for tasks assessed/performed Overall Cognitive Status: Within Functional Limits for tasks assessed                      General Comments      Exercises        Assessment/Plan    PT Assessment Patient needs continued PT services  PT Diagnosis  Difficulty walking;Generalized weakness   PT Problem List Decreased strength;Decreased mobility;Decreased safety awareness;Decreased activity tolerance;Decreased balance;Cardiopulmonary status limiting activity;Decreased knowledge of use of DME  PT Treatment Interventions DME instruction;Therapeutic activities;Therapeutic exercise;Gait training;Balance training;Stair training;Neuromuscular re-education   PT Goals (Current goals can be found in the Care Plan section) Acute Rehab PT Goals Patient Stated Goal: To return home  PT Goal Formulation: With patient Time For Goal Achievement: 12/16/15 Potential to Achieve Goals: Good    Frequency Min 2X/week   Barriers to discharge Inaccessible home environment      Co-evaluation               End of Session Equipment Utilized During Treatment: Gait belt Activity Tolerance: Patient limited by fatigue Patient left: with bed alarm set Nurse Communication: Mobility status         Time: [id number] PT Time Calculation (min) (ACUTE ONLY): 17 min   Charges:   PT Evaluation $Initial PT Evaluation Tier I: 1 Procedure     PT G Codes:        Irish Piech 12/13/15, 11:26 AM Greggory Stallion, PT, DPT [phone]

## 2015-12-02 NOTE — Clinical Documentation Improvement (Signed)
Hospitalist  Please clarify if hypernatremia is present during this hospital visit.    Other  Clinically Undetermined   Supporting Information: She is a little hypernatremic today and has low sodium. Possibly dehydrated. We'll provide gentle hydration.  Sodium:  12/09:  143. 12/10:  143. 12/12:  139.   Please exercise your independent, professional judgment when responding. A specific answer is not anticipated or expected.   Thank You, Harris [phone]

## 2015-12-02 NOTE — Progress Notes (Signed)
Okay per Dr. Anselm Jungling to place order for pt to be NPO for ultrasound as well as discontinue prednisone tablet.

## 2015-12-02 NOTE — Progress Notes (Signed)
[facility]  ROUNDING NOTE   Subjective:  Renal function had been improving. BUN currently up to 22. Potassium also down to 3.2. Shortness of breath has improved.  Objective:  Vital signs in last 24 hours:  Temp:  [98.2 F (36.8 C)-98.3 F (36.8 C)] 98.3 F (36.8 C) (12/12 0549) Pulse Rate:  [62-81] 62 (12/12 0549) Resp:  [16-18] 18 (12/12 0549) BP: (122-139)/(54-58) 139/58 mmHg (12/12 0549) SpO2:  [96 %-100 %] 99 % (12/12 1308)  Weight change:  Filed Weights   11/29/15 1326 11/29/15 1840 11/30/15 1531  Weight: 100.064 kg (220 lb 9.6 oz) 98.9 kg (218 lb 0.6 oz) 99.791 kg (220 lb)    Intake/Output: I/O last 3 completed shifts: In: 300 [P.O.:300] Out: 750 [Urine:750]   Intake/Output this shift:  Total I/O In: 0  Out: 250 [Urine:250]  Physical Exam: General: NAD  Head: Normocephalic, atraumatic. Moist oral mucosal membranes  Eyes: Icterus noted  Neck: Supple, trachea midline  Lungs:  Mild scattered wheezing; normal effort  Heart: Regular rate and rhythm  Abdomen:  Soft, nontender, BS present  Extremities:  + peripheral edema.  Neurologic: Nonfocal, moving all four extremities  Skin: No lesions       Basic Metabolic Panel:  Recent Labs Lab 11/29/15 1419 11/30/15 0713 12/02/15 0541  NA 143 143 139  K 2.7* 3.8 3.2*  CL 107 110 107  CO2 24 26 25   GLUCOSE 147* 128* 139*  BUN 12 14 22*  CREATININE UNABLE TO REPORT DUE TO ICTERUS  UNABLE TO REPORT DUE TO ICTERIC INTERFERENCE UNABLE TO REPORT DUE TO ICTERUS  CALCIUM 8.6* 8.4* 8.6*  MG 1.2*  --   --     Liver Function Tests:  Recent Labs Lab 11/29/15 1419 11/30/15 0713 12/02/15 0541  AST 83* 80* 86*  ALT 69* 55* 69*  ALKPHOS 84 74 103  BILITOT 29.8* 28.2* 25.3*  PROT 6.5 5.4* 5.3*  ALBUMIN 2.7* 2.3* 2.2*   No results for input(s): LIPASE, AMYLASE in the last 168 hours.  Recent Labs Lab 11/30/15 0713  AMMONIA 44*    CBC:  Recent Labs Lab 11/29/15 1419 11/30/15 0713  12/02/15 0541  WBC 12.9* 15.1* 12.3*  HGB 9.7* 9.0* 9.3*  HCT 29.9* 27.2* 27.8*  MCV 102.1* 99.6 100.4*  PLT 86* 75* 89*    Cardiac Enzymes:  Recent Labs Lab 11/29/15 1419  TROPONINI 0.03    BNP: Invalid input(s): POCBNP  CBG: No results for input(s): GLUCAP in the last 168 hours.  Microbiology: Results for orders placed or performed during the hospital encounter of 11/29/15  MRSA PCR Screening     Status: Abnormal   Collection Time: 11/29/15  6:45 PM  Result Value Ref Range Status   MRSA by PCR POSITIVE (A) NEGATIVE Final    Comment:        The GeneXpert MRSA Assay (FDA approved for NASAL specimens only), is one component of a comprehensive MRSA colonization surveillance program. It is not intended to diagnose MRSA infection nor to guide or monitor treatment for MRSA infections. CRITICAL RESULT CALLED TO, READ BACK BY AND VERIFIED WITH: PAMELA ROMLEY ON 12/09/15 AT 2005PM BY TB.     Coagulation Studies: No results for input(s): LABPROT, INR in the last 72 hours.  Urinalysis: No results for input(s): COLORURINE, LABSPEC, PHURINE, GLUCOSEU, HGBUR, BILIRUBINUR, KETONESUR, PROTEINUR, UROBILINOGEN, NITRITE, LEUKOCYTESUR in the last 72 hours.  Invalid input(s): APPERANCEUR    Imaging: No results found.   Medications:     .  antiseptic oral rinse  7 mL Mouth Rinse q12n4p  . chlorhexidine  15 mL Mouth Rinse BID  . Chlorhexidine Gluconate Cloth  6 each Topical Q0600  . fluconazole  100 mg Oral Daily  . folic acid  1 mg Oral Daily  . heparin  5,000 Units Subcutaneous 3 times per day  . ipratropium-albuterol  3 mL Nebulization Q4H  . lactulose  10 g Oral BID  . levofloxacin  500 mg Oral Daily  . multivitamin with minerals  1 tablet Oral Daily  . mupirocin ointment  1 application Nasal BID  . pantoprazole  40 mg Oral Daily  . potassium chloride  40 mEq Oral BID  . prednisoLONE  40 mg Oral QAC breakfast  . thiamine  100 mg Oral Daily   Or  . thiamine   100 mg Intravenous Daily   acetaminophen **OR** acetaminophen, LORazepam **OR** LORazepam, morphine injection  Assessment/ Plan:  49 y.o. female with a PMHx of hypertension, seizure disorder,alcohol abuse, substance abuse (cocaine) who was admitted to [facility] on 11/06/2015 for evaluation of abdominal pain, malaise, and body aches.  1. Acute renal failure. 2. Liver cirrhosis. 3. Hyperbilirubinemia. 4.  Shortness of breath. 5.  Hypokalemia.  Plan: BUN slightly higher today.  Creatinine can't be calculated secondary to hyperbilirubinemia. IV fluids were previously stopped.  We may need to restart these if BUN continues to rise.  Agree with potassium supplementation given low potassium of 3.2.  She was found to have hypokalemia during the last admission as well.  She reports that she has been abstaining from alcohol at home. Continue to monitor renal function daily for now.   LOS: 3 Dawana Asper 12/12/20163:00 PM

## 2015-12-02 NOTE — Progress Notes (Signed)
New referral for hospice at home on 49yo patient who was admitted on 37.9.16 with respiratory failure.  Patient was pending [facility] referral but was waiting for her to establish PCP visit on 12.9.16- she was admitted prior to that MD visit.  She has past medical history alcoholic cirrhosis, cocain abuse, Hep C, renal failure, HTN, seizures, and CKD.  Patient is post palliative medicine consult and decided she would like to be hospice instead of home health at this time.  I spoke to the patient regarding hospice services and she is in agreement.  No DME needs identified, but RN will assess for needs at admission.  Patient would like call tomorrow morning to schedule admission time.  Discharge home planned for today.  Updated information faxed to referral intake.

## 2015-12-02 NOTE — Care Management (Signed)
Dicussed case with Dr Anselm Jungling concerning discharge plan and appropriateness of Hospice. MD agrees that patient is Home with Hospice appropriate and OK for palliative to see before discharge.  Discussed patient case at length with Dr Megan Salon Palliative care over the phone. Dr Megan Salon recalled discussion with patient at last admission. MD agrees that patient prognosis warrants Home with Hospice and that this would be a good discharge plan.  Spoke with patient for discharge planning.Patient had attempted to see Dr Marylou Mccoy for her follow up appointment but stated that the doctor would not see her until her Medicaid card has the MD name on it. The patient retrieved her New Medicaid card which did have Dr Ian Bushman. Willett's name on it. Dr Ilene Qua is in fact retired, this was supposed to be changed at last visit.  I had contacted Rose at [facility] office at last visit and this was supposed to be changed prior to new card being issued. Contacted Rose again today at [facility] office and she stated because the card was issued at the end of the month it would take 30 days for the new name to show in the system. Rose agreed to check on this again today and see if it could be expedited. [place] and spoke with Jinny Blossom who confirmed that patient had no see Dr Ilene Qua and that clinic was not accepting new Medicaid appointments. Unit secretary maid a new appointment for patient with Dr Mylinda Latina office [facility]) for the 20th.   Discussed home health and Home with hospice and the differences between the two. Social worker from [facility] [place]) [phone] Peer Support Specialist. Patient stated OK to discuss condition with her present.  Patient is now agreeable to Home with Hospice after l discussed the benefits and additional services. Patient has wrongly assumed that she would need to got to a facility for hospice. Explained to patient that Hospice could come to her home and  that she could have this service at her residence. Explained to patient that [facility] she had already chosen for [facility] [place] [facility] And would be able to change her status to home with hospice and provide her with a physician Dr Nyra Capes until she could be established with [facility]. Explained that this would be with [facility]  and [place] and patient agrees to this plan.  Patient requested new Albuterol Inhaler, Nadolol, and Lactulose. Stated that she is out of those meds. Discussed with Dr Anselm Jungling whos stated that he would add to discharge meds.   Plan is for patient to discharge with home with hospice. Follow up with Dr Mylinda Latina office on 20th. Home meds filled by attending. Use Dr Nyra Capes until PCP established.  Patient lives with son and has walker. Is followed by [employer] care social worker who is helping patient with appointments and substance abuse issues.

## 2015-12-19 ENCOUNTER — Encounter: Payer: Self-pay | Admitting: Emergency Medicine

## 2015-12-19 ENCOUNTER — Emergency Department

## 2015-12-19 ENCOUNTER — Inpatient Hospital Stay
Admission: EM | Admit: 2015-12-19 | Discharge: 2015-12-27 | DRG: 871 | Disposition: A | Discharge status: Hospice | Attending: Internal Medicine | Admitting: Internal Medicine

## 2015-12-19 DIAGNOSIS — D6959 Other secondary thrombocytopenia: Secondary | ICD-10-CM | POA: Diagnosis present

## 2015-12-19 DIAGNOSIS — F1010 Alcohol abuse, uncomplicated: Secondary | ICD-10-CM | POA: Diagnosis present

## 2015-12-19 DIAGNOSIS — N179 Acute kidney failure, unspecified: Secondary | ICD-10-CM | POA: Diagnosis present

## 2015-12-19 DIAGNOSIS — Z79899 Other long term (current) drug therapy: Secondary | ICD-10-CM | POA: Diagnosis not present

## 2015-12-19 DIAGNOSIS — J189 Pneumonia, unspecified organism: Secondary | ICD-10-CM

## 2015-12-19 DIAGNOSIS — R197 Diarrhea, unspecified: Secondary | ICD-10-CM

## 2015-12-19 DIAGNOSIS — K7031 Alcoholic cirrhosis of liver with ascites: Secondary | ICD-10-CM | POA: Diagnosis not present

## 2015-12-19 DIAGNOSIS — B1920 Unspecified viral hepatitis C without hepatic coma: Secondary | ICD-10-CM | POA: Diagnosis present

## 2015-12-19 DIAGNOSIS — R600 Localized edema: Secondary | ICD-10-CM | POA: Diagnosis present

## 2015-12-19 DIAGNOSIS — E43 Unspecified severe protein-calorie malnutrition: Secondary | ICD-10-CM | POA: Diagnosis present

## 2015-12-19 DIAGNOSIS — N189 Chronic kidney disease, unspecified: Secondary | ICD-10-CM | POA: Diagnosis present

## 2015-12-19 DIAGNOSIS — R571 Hypovolemic shock: Secondary | ICD-10-CM

## 2015-12-19 DIAGNOSIS — K529 Noninfective gastroenteritis and colitis, unspecified: Secondary | ICD-10-CM | POA: Diagnosis present

## 2015-12-19 DIAGNOSIS — I129 Hypertensive chronic kidney disease with stage 1 through stage 4 chronic kidney disease, or unspecified chronic kidney disease: Secondary | ICD-10-CM | POA: Diagnosis present

## 2015-12-19 DIAGNOSIS — Z6841 Body Mass Index (BMI) 40.0 and over, adult: Secondary | ICD-10-CM | POA: Diagnosis not present

## 2015-12-19 DIAGNOSIS — K7010 Alcoholic hepatitis without ascites: Secondary | ICD-10-CM | POA: Diagnosis present

## 2015-12-19 DIAGNOSIS — R188 Other ascites: Secondary | ICD-10-CM

## 2015-12-19 DIAGNOSIS — K7460 Unspecified cirrhosis of liver: Secondary | ICD-10-CM

## 2015-12-19 DIAGNOSIS — K7040 Alcoholic hepatic failure without coma: Secondary | ICD-10-CM | POA: Diagnosis not present

## 2015-12-19 DIAGNOSIS — M79606 Pain in leg, unspecified: Secondary | ICD-10-CM

## 2015-12-19 DIAGNOSIS — E876 Hypokalemia: Secondary | ICD-10-CM | POA: Diagnosis not present

## 2015-12-19 DIAGNOSIS — A419 Sepsis, unspecified organism: Principal | ICD-10-CM

## 2015-12-19 DIAGNOSIS — Z515 Encounter for palliative care: Secondary | ICD-10-CM | POA: Diagnosis not present

## 2015-12-19 DIAGNOSIS — K7030 Alcoholic cirrhosis of liver without ascites: Secondary | ICD-10-CM | POA: Diagnosis not present

## 2015-12-19 DIAGNOSIS — E861 Hypovolemia: Secondary | ICD-10-CM | POA: Diagnosis present

## 2015-12-19 DIAGNOSIS — R630 Anorexia: Secondary | ICD-10-CM | POA: Diagnosis not present

## 2015-12-19 DIAGNOSIS — N390 Urinary tract infection, site not specified: Secondary | ICD-10-CM

## 2015-12-19 DIAGNOSIS — Z66 Do not resuscitate: Secondary | ICD-10-CM | POA: Diagnosis present

## 2015-12-19 DIAGNOSIS — I959 Hypotension, unspecified: Secondary | ICD-10-CM

## 2015-12-19 DIAGNOSIS — J9601 Acute respiratory failure with hypoxia: Secondary | ICD-10-CM | POA: Diagnosis present

## 2015-12-19 DIAGNOSIS — Z87891 Personal history of nicotine dependence: Secondary | ICD-10-CM

## 2015-12-19 DIAGNOSIS — E871 Hypo-osmolality and hyponatremia: Secondary | ICD-10-CM | POA: Diagnosis present

## 2015-12-19 DIAGNOSIS — Z9981 Dependence on supplemental oxygen: Secondary | ICD-10-CM

## 2015-12-19 DIAGNOSIS — R140 Abdominal distension (gaseous): Secondary | ICD-10-CM

## 2015-12-19 DIAGNOSIS — Z452 Encounter for adjustment and management of vascular access device: Secondary | ICD-10-CM

## 2015-12-19 DIAGNOSIS — I9589 Other hypotension: Secondary | ICD-10-CM

## 2015-12-19 DIAGNOSIS — K7290 Hepatic failure, unspecified without coma: Secondary | ICD-10-CM | POA: Diagnosis present

## 2015-12-19 DIAGNOSIS — R278 Other lack of coordination: Secondary | ICD-10-CM | POA: Diagnosis not present

## 2015-12-19 DIAGNOSIS — E872 Acidosis: Secondary | ICD-10-CM | POA: Diagnosis present

## 2015-12-19 DIAGNOSIS — D638 Anemia in other chronic diseases classified elsewhere: Secondary | ICD-10-CM | POA: Diagnosis present

## 2015-12-19 DIAGNOSIS — K766 Portal hypertension: Secondary | ICD-10-CM | POA: Diagnosis present

## 2015-12-19 HISTORY — DX: Alcoholic cirrhosis of liver without ascites: K70.30

## 2015-12-19 LAB — URINALYSIS COMPLETE WITH MICROSCOPIC (ARMC ONLY)
Bacteria, UA: NONE SEEN
Glucose, UA: NEGATIVE mg/dL
KETONES UR: NEGATIVE mg/dL
NITRITE: NEGATIVE
PH: 6 (ref 5.0–8.0)
Protein, ur: NEGATIVE mg/dL
SPECIFIC GRAVITY, URINE: 1.014 (ref 1.005–1.030)

## 2015-12-19 LAB — CBC WITH DIFFERENTIAL/PLATELET
Basophils Absolute: 0 10*3/uL (ref 0–0.1)
Basophils Relative: 0 %
Eosinophils Absolute: 0 10*3/uL (ref 0–0.7)
HCT: 29.6 % — ABNORMAL LOW (ref 35.0–47.0)
Hemoglobin: 9.8 g/dL — ABNORMAL LOW (ref 12.0–16.0)
LYMPHS ABS: 0.6 10*3/uL — AB (ref 1.0–3.6)
Lymphocytes Relative: 7 %
MCH: 33 pg (ref 26.0–34.0)
MCHC: 33.1 g/dL (ref 32.0–36.0)
MCV: 99.6 fL (ref 80.0–100.0)
MONO ABS: 0.7 10*3/uL (ref 0.2–0.9)
Monocytes Relative: 9 %
Neutro Abs: 6.5 10*3/uL (ref 1.4–6.5)
Neutrophils Relative %: 84 %
PLATELETS: 39 10*3/uL — AB (ref 150–440)
RBC: 2.97 MIL/uL — AB (ref 3.80–5.20)
RDW: 23.9 % — ABNORMAL HIGH (ref 11.5–14.5)
WBC: 7.8 10*3/uL (ref 3.6–11.0)

## 2015-12-19 LAB — COMPREHENSIVE METABOLIC PANEL
ALK PHOS: 141 U/L — AB (ref 38–126)
ALT: 51 U/L (ref 14–54)
ANION GAP: 7 (ref 5–15)
AST: 65 U/L — ABNORMAL HIGH (ref 15–41)
Albumin: 1.7 g/dL — ABNORMAL LOW (ref 3.5–5.0)
BILIRUBIN TOTAL: 28.8 mg/dL — AB (ref 0.3–1.2)
BUN: 19 mg/dL (ref 6–20)
CALCIUM: 7.2 mg/dL — AB (ref 8.9–10.3)
CO2: 21 mmol/L — ABNORMAL LOW (ref 22–32)
CREATININE: UNDETERMINED mg/dL (ref 0.44–1.00)
Chloride: 105 mmol/L (ref 101–111)
GLUCOSE: 98 mg/dL (ref 65–99)
Potassium: 3.8 mmol/L (ref 3.5–5.1)
Sodium: 133 mmol/L — ABNORMAL LOW (ref 135–145)
TOTAL PROTEIN: 5.1 g/dL — AB (ref 6.5–8.1)

## 2015-12-19 LAB — AMMONIA: Ammonia: 30 umol/L (ref 9–35)

## 2015-12-19 LAB — TYPE AND SCREEN
ABO/RH(D): O NEG
ANTIBODY SCREEN: NEGATIVE
DAT, IgG: NEGATIVE
WEAK D: POSITIVE

## 2015-12-19 LAB — LACTIC ACID, PLASMA
LACTIC ACID, VENOUS: 2 mmol/L (ref 0.5–2.0)
Lactic Acid, Venous: 3 mmol/L (ref 0.5–2.0)

## 2015-12-19 LAB — TROPONIN I: TROPONIN I: 0.03 ng/mL (ref <0.031)

## 2015-12-19 LAB — PROTIME-INR
INR: 1.78
Prothrombin Time: 20.7 seconds — ABNORMAL HIGH (ref 11.4–15.0)

## 2015-12-19 LAB — GLUCOSE, CAPILLARY: GLUCOSE-CAPILLARY: 80 mg/dL (ref 65–99)

## 2015-12-19 LAB — APTT: aPTT: 43 seconds — ABNORMAL HIGH (ref 24–36)

## 2015-12-19 LAB — LIPASE, BLOOD: Lipase: 17 U/L (ref 11–51)

## 2015-12-19 MED ORDER — DEXTROSE 5 % IV SOLN
1.0000 g | INTRAVENOUS | Status: DC
  Filled 2015-12-19 (×2): qty 10

## 2015-12-19 MED ORDER — VANCOMYCIN HCL 500 MG IV SOLR
500.0000 mg | Freq: Once | INTRAVENOUS | Status: DC
  Filled 2015-12-19: qty 500

## 2015-12-19 MED ORDER — OXYCODONE HCL 5 MG PO TABS
5.0000 mg | ORAL_TABLET | ORAL | Status: DC | PRN
Start: 2015-12-19 — End: 2015-12-27
  Administered 2015-12-19 – 2015-12-27 (×11): 5 mg via ORAL
  Filled 2015-12-19 (×11): qty 1

## 2015-12-19 MED ORDER — SODIUM CHLORIDE 0.9 % IJ SOLN
3.0000 mL | Freq: Two times a day (BID) | INTRAMUSCULAR | Status: DC
  Administered 2015-12-20 – 2015-12-27 (×12): 3 mL via INTRAVENOUS

## 2015-12-19 MED ORDER — ONDANSETRON HCL 4 MG/2ML IJ SOLN
4.0000 mg | Freq: Four times a day (QID) | INTRAMUSCULAR | Status: DC | PRN
  Administered 2015-12-24: 4 mg via INTRAVENOUS
  Filled 2015-12-19: qty 2

## 2015-12-19 MED ORDER — ONDANSETRON HCL 4 MG PO TABS: 4.0000 mg | ORAL_TABLET | Freq: Four times a day (QID) | ORAL | Status: DC | PRN

## 2015-12-19 MED ORDER — DEXTROSE 5 % IV SOLN
0.0000 ug/min | INTRAVENOUS | Status: DC
  Administered 2015-12-20: 2 ug/min via INTRAVENOUS
  Filled 2015-12-19: qty 4

## 2015-12-19 MED ORDER — DEXTROSE 5 % IV SOLN
2.0000 g | Freq: Once | INTRAVENOUS | Status: AC
  Administered 2015-12-19: 2 g via INTRAVENOUS
  Filled 2015-12-19: qty 2

## 2015-12-19 MED ORDER — LINEZOLID 600 MG/300ML IV SOLN
600.0000 mg | Freq: Two times a day (BID) | INTRAVENOUS | Status: DC
  Administered 2015-12-19: 600 mg via INTRAVENOUS
  Filled 2015-12-19: qty 300

## 2015-12-19 MED ORDER — PANTOPRAZOLE SODIUM 40 MG PO TBEC
40.0000 mg | DELAYED_RELEASE_TABLET | Freq: Every day | ORAL | Status: DC
  Administered 2015-12-19 – 2015-12-26 (×8): 40 mg via ORAL
  Filled 2015-12-19 (×8): qty 1

## 2015-12-19 MED ORDER — SODIUM CHLORIDE 0.9 % IV BOLUS (SEPSIS)
1000.0000 mL | Freq: Once | INTRAVENOUS | Status: AC
  Administered 2015-12-19: 1000 mL via INTRAVENOUS

## 2015-12-19 MED ORDER — LEVOFLOXACIN IN D5W 750 MG/150ML IV SOLN
750.0000 mg | Freq: Once | INTRAVENOUS | Status: AC
  Administered 2015-12-19: 750 mg via INTRAVENOUS
  Filled 2015-12-19: qty 150

## 2015-12-19 MED ORDER — LACTULOSE 10 GM/15ML PO SOLN
10.0000 g | Freq: Two times a day (BID) | ORAL | Status: DC
  Administered 2015-12-20 (×2): 10 g via ORAL
  Filled 2015-12-19 (×2): qty 30

## 2015-12-19 MED ORDER — SODIUM CHLORIDE 0.9 % IV SOLN
Freq: Once | INTRAVENOUS | Status: AC
  Administered 2015-12-19: 15:00:00 via INTRAVENOUS

## 2015-12-19 MED ORDER — VANCOMYCIN HCL IN DEXTROSE 1-5 GM/200ML-% IV SOLN
1000.0000 mg | Freq: Once | INTRAVENOUS | Status: AC
  Administered 2015-12-19: 1000 mg via INTRAVENOUS
  Filled 2015-12-19: qty 200

## 2015-12-19 MED ORDER — SODIUM CHLORIDE 0.9 % IV BOLUS (SEPSIS)
1000.0000 mL | INTRAVENOUS | Status: AC
  Administered 2015-12-19 (×2): 1000 mL via INTRAVENOUS

## 2015-12-19 MED ORDER — IPRATROPIUM-ALBUTEROL 0.5-2.5 (3) MG/3ML IN SOLN
3.0000 mL | Freq: Four times a day (QID) | RESPIRATORY_TRACT | Status: DC
  Administered 2015-12-19 – 2015-12-20 (×4): 3 mL via RESPIRATORY_TRACT
  Filled 2015-12-19 (×4): qty 3

## 2015-12-19 MED ORDER — DEXTROSE 5 % IV SOLN: 1.0000 g | INTRAVENOUS | Status: DC

## 2015-12-19 MED ORDER — PREDNISONE 50 MG PO TABS
50.0000 mg | ORAL_TABLET | Freq: Every day | ORAL | Status: DC
  Administered 2015-12-20 – 2015-12-24 (×5): 50 mg via ORAL
  Filled 2015-12-19 (×5): qty 1

## 2015-12-19 MED ORDER — SODIUM CHLORIDE 0.9 % IV SOLN
INTRAVENOUS | Status: DC
  Administered 2015-12-19 – 2015-12-26 (×11): via INTRAVENOUS

## 2015-12-19 MED ORDER — DEXTROSE 5 % IV SOLN
0.0000 ug/min | INTRAVENOUS | Status: DC
  Administered 2015-12-19: 10 ug/min via INTRAVENOUS
  Administered 2015-12-20: 50 ug/min via INTRAVENOUS
  Administered 2015-12-20: 60 ug/min via INTRAVENOUS
  Filled 2015-12-19 (×3): qty 1

## 2015-12-19 MED ORDER — ADULT MULTIVITAMIN W/MINERALS CH
1.0000 | ORAL_TABLET | Freq: Every day | ORAL | Status: DC
  Administered 2015-12-19 – 2015-12-26 (×8): 1 via ORAL
  Filled 2015-12-19 (×8): qty 1

## 2015-12-19 NOTE — Progress Notes (Signed)
BP continues to run low despite boluses.  E-link MD informed. New orders received.  Will continue to monitor.

## 2015-12-19 NOTE — ED Notes (Addendum)
Pt to ed with c/o sob today, pt with hx of kidney failure, liver cirrhosis, and copd.  Pt sats 100% on 3 lpm.  Pt sats 86% on RA.  Pt placed on cm and ekg done.

## 2015-12-19 NOTE — ED Notes (Signed)
P[er dr Joni Fears pt hemmocult is positive.

## 2015-12-19 NOTE — Progress Notes (Signed)
eLink Physician-Brief Progress Note Patient Name: Abigail Cook DOB: Jan 09, 1966 MRN: [id number]   Date of Service  12/19/2015  HPI/Events of Note  Hypotension - BP = 71/37.  eICU Interventions  Will bolus with 0.9 NaCl 1 liter IV over 1 hour now.     Intervention Category Major Interventions: Hypotension - evaluation and management  [place] Droll Cornelia Copa 12/19/2015, 7:19 PM

## 2015-12-19 NOTE — Progress Notes (Signed)
eLink Physician-Brief Progress Note Patient Name: Abigail Cook DOB: 1966-02-04 MRN: [id number]   Date of Service  12/19/2015  HPI/Events of Note  Hypotension - BP = 77/62. Has had 5 liters of fluid boluses. No CVL.  eICU Interventions  Will order: Phenylephrine IV infusion. Titrate to MAP >= 65.     Intervention Category Major Interventions: Hypotension - evaluation and management  Sommer,Steven Cornelia Copa 12/19/2015, 9:58 PM

## 2015-12-19 NOTE — Progress Notes (Signed)
Visit made. Patient is followed by Hospice and [place] at home with a hospice diagnosis of liver cirrhosis.She is a Full Code.  She was sent to the [facility] ED today for evaluation of decreased oxygen saturations of 84 %, she uses oxygen at home, usually 2 liters, was increased to 3 with no improvement oxygen saturations.  Patient has been admitted for treatment of bialteral pneumonia. Patient seen lying in bed, alert and oriented, denied pain, on room air. She does c/o frequent loose stools, per her hospice nurse this has been chronic as patient  takes lactulose. Per conversation with Critical Care physician Dr. Mortimer Fries, patient has agreed to DNR. She is currently receiving IV antibiotics and IV fluids, BP remains low. Will continue to follow and update hospice team. Flo Shanks RN, BS, [facility] liaison [phone] c

## 2015-12-19 NOTE — H&P (Signed)
[place] [facility] [place] NAME: Abigail Cook    MR#:  [id number]  DATE OF BIRTH:  [birth year]  DATE OF ADMISSION:  12/19/2015  PRIMARY CARE PHYSICIAN: Hospice  REQUESTING/REFERRING PHYSICIAN: Dr Joni Fears  CHIEF COMPLAINT:   Chief Complaint  Patient presents with  . Shortness of Breath    HISTORY OF PRESENT ILLNESS:  Abigail Cook  is a 49 y.o. female with a known history of cirrhosis of the liver with alcoholic hepatitis, acute renal failure. She presents with shortness of breath going on for a few days she also complains of chest pain described as a stabbing throbbing in the chest 7 out of 10 intensity. It is helped by sitting up. She's been coughing up gray phlegm. Her blood pressure is been low. She's been having pain in the legs for a long time now. In the ER, her blood pressure was low and she was found to have pneumonia bilaterally on chest x-ray and hospitalist services were contacted for further evaluation.  PAST MEDICAL HISTORY:   Past Medical History  Diagnosis Date  . Hypertension   . Seizures ([place])   . Chronic kidney disease   . Alcoholic cirrhosis of liver ([place])     PAST SURGICAL HISTORY:   Past Surgical History  Procedure Laterality Date  . Rod placed in right leg/foot    . Esophagogastroduodenoscopy Left 11/18/2015    Procedure: ESOPHAGOGASTRODUODENOSCOPY (EGD);  Surgeon: Hulen Luster, MD;  Location: [facility] ENDOSCOPY;  Service: Endoscopy;  Laterality: Left;  Marland Kitchen Mandible surgery      SOCIAL HISTORY:   Social History  Substance Use Topics  . Smoking status: Former Research scientist (life sciences)  . Smokeless tobacco: Not on file  . Alcohol Use: No     Comment: every other day    FAMILY HISTORY:   Family History  Problem Relation Age of Onset  . Cirrhosis Mother   . Cirrhosis Father     DRUG ALLERGIES:  No Known Allergies  REVIEW OF SYSTEMS:  CONSTITUTIONAL: No fever, positive for fatigue.  EYES: Poor vision EARS, NOSE,  AND THROAT: No tinnitus or ear pain. No sore throat RESPIRATORY: Positive for cough and shortness of breath, no wheezing or hemoptysis.  CARDIOVASCULAR: Positive for chest pain, no orthopnea, edema.  GASTROINTESTINAL: No nausea, vomiting. Positive for diarrhea and abdominal pain. Bright red blood per rectum GENITOURINARY: Burning on urination ENDOCRINE: No polyuria, nocturia,  HEMATOLOGY: No anemia, easy bruising or bleeding SKIN: No rash or lesion. MUSCULOSKELETAL: No joint pain or arthritis.   NEUROLOGIC: No tingling, numbness, weakness.  PSYCHIATRY: No anxiety or depression.   MEDICATIONS AT HOME:   Prior to Admission medications   Medication Sig Start Date End Date Taking? Authorizing Provider  albuterol (PROVENTIL HFA;VENTOLIN HFA) 108 (90 BASE) MCG/ACT inhaler Inhale 2 puffs into the lungs every 6 (six) hours as needed for wheezing or shortness of breath. 12/02/15  Yes Vaughan Basta, MD  ipratropium-albuterol (DUONEB) 0.5-2.5 (3) MG/3ML SOLN Take 1 vial by nebulization every 6 (six) hours as needed. 12/04/15  Yes Historical Provider, MD  lactulose (CHRONULAC) 10 GM/15ML solution Take 15 mLs (10 g total) by mouth 2 (two) times daily. 12/02/15  Yes Vaughan Basta, MD  Multiple Vitamin (MULTIVITAMIN WITH MINERALS) TABS tablet Take 1 tablet by mouth daily. 12/02/15  Yes Vaughan Basta, MD  mupirocin ointment (BACTROBAN) 2 % Apply 1 application topically 3 (three) times daily. To each nostril for 1 week 12/12/15 12/19/15 Yes Historical Provider, MD  nadolol (  CORGARD) 20 MG tablet Take 1 tablet (20 mg total) by mouth daily. 12/02/15  Yes Vaughan Basta, MD  pantoprazole (PROTONIX) 40 MG tablet Take 1 tablet (40 mg total) by mouth daily. 12/02/15  Yes Vaughan Basta, MD  Potassium Chloride 40 MEQ/15ML (20%) SOLN Take 15 mLs by mouth daily. For one week 12/12/15 12/19/15 Yes Historical Provider, MD      VITAL SIGNS:  Blood pressure 91/72, pulse 63,  temperature 97.8 F (36.6 C), temperature source Oral, resp. rate 28, height 5\' 2"  (1.575 m), weight 99.791 kg (220 lb), SpO2 100 %.  PHYSICAL EXAMINATION:  GENERAL:  49 y.o.-year-old patient lying in the bed with no acute distress.  EYES: Pupils equal, round, reactive to light and accommodation. Positive for icterus. Extraocular muscles intact.  HEENT: Head atraumatic, normocephalic. Oropharynx and nasopharynx clear.  NECK:  Supple, no jugular venous distention. No thyroid enlargement, no tenderness.  LUNGS: Normal breath sounds bilaterally, no wheezing, rales,rhonchi or crepitation. No use of accessory muscles of respiration.  CARDIOVASCULAR: S1, S2 normal. No murmurs, rubs, or gallops.  ABDOMEN: Soft, nontender, nondistended. Bowel sounds present. No organomegaly or mass.  EXTREMITIES: 3+ pedal edema, no cyanosis, or clubbing.  NEUROLOGIC: Cranial nerves II through XII are intact. Muscle strength 5/5 in all extremities. Sensation intact. Gait not checked.  PSYCHIATRIC: The patient is alert and oriented x 3.  SKIN: No rash, lesion, or ulcer. Positive for jaundice  LABORATORY PANEL:   CBC  Recent Labs Lab 12/19/15 1114  WBC 7.8  HGB 9.8*  HCT 29.6*  PLT 39*   ------------------------------------------------------------------------------------------------------------------  Chemistries   Recent Labs Lab 12/19/15 1114  NA 133*  K 3.8  CL 105  CO2 21*  GLUCOSE 98  BUN 19  CREATININE UNABLE TO REPORT DUE TO ICTERUS  CALCIUM 7.2*  AST 65*  ALT 51  ALKPHOS 141*  BILITOT 28.8*   ------------------------------------------------------------------------------------------------------------------  Cardiac Enzymes  Recent Labs Lab 12/19/15 1114  TROPONINI 0.03   ------------------------------------------------------------------------------------------------------------------  RADIOLOGY:  Dg Chest Port 1 View  12/19/2015  CLINICAL DATA:  Shortness of breath EXAM:  PORTABLE CHEST 1 VIEW COMPARISON:  11/29/2015 chest radiograph. FINDINGS: Stable cardiomediastinal silhouette with top normal heart size. No pneumothorax. No pleural effusion. There hazy patchy parahilar opacities in both lungs. IMPRESSION: Nonspecific hazy patchy parahilar opacities in both lungs, which could represent a developing multifocal pneumonia. Consider short-term follow-up with PA and lateral chest radiographs. Electronically Signed   By: Ilona Sorrel M.D.   On: 12/19/2015 12:47    EKG:   Normal sinus rhythm 65 bpm flipped T waves V1 and V2  IMPRESSION AND PLAN:   1. Sepsis, hypotension, bilateral pneumonia. Since the patient recently in the hospital will need triple antibiotics. Since I do not have a creatinine I will give Zyvox. I will also give Levaquin and ceftazidime. Critical care specialist consultation. IV fluid hydration. Stop nadolol. Pressors if needed. 2. Alcoholic liver cirrhosis with alcoholic hepatitis. Jaundice and thrombocytopenia and auto anticoagulation with liver failure. Cannot give nadolol with hypotension. Continue prednisolone. Continue lactulose. 3. Acute respiratory failure with pulse ox being 86% on room air- continue oxygen supplementation. Patient follows with hospice at home but wants to be a full code. 4. Acute renal failure on previous hospitalization. Cannot calculate creatinine because of high bilirubin. 5. Hyponatremia- IV fluid hydration with normal saline 6. Anemia- likely of chronic disease hemoglobin has been stable since last time. 7. Pain in bilateral lower extremities will get an ultrasound to rule out DVT.  All the records are reviewed and case discussed with ED provider. Management plans discussed with the patient, family and they are in agreement.  CODE STATUS: Full code  TOTAL TIME TAKING CARE OF THIS PATIENT: 55 minutes, patient will be admitted to the critical care unit and overall prognosis is going to be poor.Leslye Peer, Ralene Bathe.D on 12/19/2015 at 3:00 PM  Between 7am to 6pm - Pager - [phone]  After 6pm call admission pager [facility]  Office  [phone]  CC: Primary care physician; Hospice

## 2015-12-19 NOTE — ED Notes (Signed)
Dr Joni Fears made aware of critical lab value, total billirubin 28.8

## 2015-12-19 NOTE — ED Notes (Signed)
Pt placed on bed pan by this tech and RN Otila Kluver C) urine specimen obtained and sent to lab for testing.

## 2015-12-19 NOTE — Progress Notes (Signed)
ANTIBIOTIC CONSULT NOTE - INITIAL  Pharmacy Consult for vancomycin & cefepime Indication: pneumonia  No Known Allergies  Patient Measurements: Height: 5\' 2"  (157.5 cm) Weight: 220 lb (99.791 kg) IBW/kg (Calculated) : 50.1 Adjusted Body Weight: 70 kg  Vital Signs: Temp: 97.8 F (36.6 C) (12/29 1116) Temp Source: Oral (12/29 1116) BP: 78/57 mmHg (12/29 1200) Pulse Rate: 63 (12/29 1200)  Labs:  Recent Labs  12/19/15 1114  WBC 7.8  HGB 9.8*  PLT 39*  CREATININE UNABLE TO REPORT DUE TO ICTERUS   CrCl cannot be calculated (Patient has no serum creatinine result on file.). No results for input(s): VANCOTROUGH, VANCOPEAK, VANCORANDOM, GENTTROUGH, GENTPEAK, GENTRANDOM, TOBRATROUGH, TOBRAPEAK, TOBRARND, AMIKACINPEAK, AMIKACINTROU, AMIKACIN in the last 72 hours.   Assessment: Pharmacy consulted to dose vancomycin in this 49 year old female for HCAP. Patient was recently diagnosed with advanced cirrhosis due to alcohol abuse and hepatitis C. Patient was recently discharged for the hospital on 12/02/15 with home hospice. Unable to determine SCr due to icterus, so cannot determine renal function, however notes from previous admission discuss renal dysfunction.  Adjusted body weight = 70 kg  Goal of Therapy:  Vancomycin trough level 15-20 mcg/ml  Plan:  Will give vancomycin 1500 mg x1 dose today (~20 mg/kg)  Vancomycin random level ordered for tomorrow 24 hours after initial dose to assess renal function.   Cefepime 1 g IV daily based on renal dysfunction  Pharmacy will continue to monitor, thank you for the consult.  Darylene Price Adali Pennings 12/19/2015,12:56 PM

## 2015-12-19 NOTE — ED Provider Notes (Signed)
[facility] Emergency Department Provider Note  ____________________________________________  Time seen: 11:25 AM  I have reviewed the triage vital signs and the nursing notes.   HISTORY  Chief Complaint Shortness of Breath    HPI Abigail Cook is a 49 y.o. female with a history of liver failure who complains of shortness of breath and coughthat started today. Denies chest pain. Denies history of paracentesis. Also complains of bloody stools for the past 3 or 4 days. Generalized weakness. No fever or syncope.     Past Medical History  Diagnosis Date  . Hypertension   . Seizures ([place])   . Chronic kidney disease   . Alcoholic cirrhosis of liver [facility])      Patient Active Problem List   Diagnosis Date Noted  . Sepsis ([place]) 12/19/2015  . Acute respiratory failure ([place]) 11/29/2015  . Pressure ulcer 11/19/2015  . Hyperbilirubinemia   . Alcoholic cirrhosis of liver with ascites ([place])   . Hepatic encephalopathy ([place]) 11/06/2015     Past Surgical History  Procedure Laterality Date  . Rod placed in right leg/foot    . Esophagogastroduodenoscopy Left 11/18/2015    Procedure: ESOPHAGOGASTRODUODENOSCOPY (EGD);  Surgeon: Hulen Luster, MD;  Location: [facility] ENDOSCOPY;  Service: Endoscopy;  Laterality: Left;  Marland Kitchen Mandible surgery       Current Outpatient Rx  Name  Route  Sig  Dispense  Refill  . albuterol (PROVENTIL HFA;VENTOLIN HFA) 108 (90 BASE) MCG/ACT inhaler   Inhalation   Inhale 2 puffs into the lungs every 6 (six) hours as needed for wheezing or shortness of breath.   1 Inhaler   2   . ipratropium-albuterol (DUONEB) 0.5-2.5 (3) MG/3ML SOLN   Nebulization   Take 1 vial by nebulization every 6 (six) hours as needed.      4   . lactulose (CHRONULAC) 10 GM/15ML solution   Oral   Take 15 mLs (10 g total) by mouth 2 (two) times daily.   240 mL   2   . Multiple Vitamin (MULTIVITAMIN WITH MINERALS) TABS tablet   Oral   Take 1 tablet by mouth  daily.   30 tablet   1   . mupirocin ointment (BACTROBAN) 2 %   Topical   Apply 1 application topically 3 (three) times daily. To each nostril for 1 week      0   . nadolol (CORGARD) 20 MG tablet   Oral   Take 1 tablet (20 mg total) by mouth daily.   30 tablet   1   . pantoprazole (PROTONIX) 40 MG tablet   Oral   Take 1 tablet (40 mg total) by mouth daily.   30 tablet   0   . Potassium Chloride 40 MEQ/15ML (20%) SOLN   Oral   Take 15 mLs by mouth daily. For one week      0      Allergies Review of patient's allergies indicates no known allergies.   Family History  Problem Relation Age of Onset  . Cirrhosis Mother   . Cirrhosis Father     Social History Social History  Substance Use Topics  . Smoking status: Former Research scientist (life sciences)  . Smokeless tobacco: None  . Alcohol Use: No     Comment: every other day    Review of Systems  Constitutional:   No fever or chills. No weight changes Eyes:   No blurry vision or double vision. Chronic icterus ENT:   No sore throat. Cardiovascular:  No chest pain. Respiratory:   Positive shortness of breath and cough. Gastrointestinal:   Negative for abdominal pain, vomiting and diarrhea.  No BRBPR or melena. Genitourinary:   Negative for dysuria, urinary retention, bloody urine, or difficulty urinating. Musculoskeletal:   Negative for back pain. No joint swelling or pain. Skin:   Negative for rash. Neurological:   Negative for headaches, focal weakness or numbness. Psychiatric:  No anxiety or depression.   Endocrine:  No hot/cold intolerance, changes in energy, or sleep difficulty.  10-point ROS otherwise negative.  ____________________________________________   PHYSICAL EXAM:  VITAL SIGNS: ED Triage Vitals  Enc Vitals Group     BP 12/19/15 1116 74/45 mmHg     Pulse Rate 12/19/15 1116 66     Resp 12/19/15 1116 18     Temp 12/19/15 1116 97.8 F (36.6 C)     Temp Source 12/19/15 1116 Oral     SpO2 12/19/15 1116 86 %      Weight 12/19/15 1116 220 lb (99.791 kg)     Height 12/19/15 1116 5\' 2"  (1.575 m)     Head Cir --      Peak Flow --      Pain Score 12/19/15 1118 10     Pain Loc --      Pain Edu? --      Excl. in [place]? --     Vital signs reviewed, nursing assessments reviewed.   Constitutional:   Alert and oriented. Ill-appearing. Eyes:   Positive scleral icterus. No conjunctival pallor. PERRL. EOMI ENT   Head:   Normocephalic and atraumatic.   Nose:   No congestion/rhinnorhea. No septal hematoma   Mouth/Throat:   Dry mucous membranes, no pharyngeal erythema. No peritonsillar mass. No uvula shift.   Neck:   No stridor. No SubQ emphysema. No meningismus. Hematological/Lymphatic/Immunilogical:   No cervical lymphadenopathy. Cardiovascular:   RRR. Normal and symmetric distal pulses are present in all extremities. No murmurs, rubs, or gallops. Respiratory:  Tachypnea. Bilateral crackles, right greater than left Gastrointestinal:   Soft and nontender. Mild distention. There is no CVA tenderness.  No rebound, rigidity, or guarding. Rectal exam reveals brown stool which is Hemoccult positive Genitourinary:   deferred Musculoskeletal:   Nontender with normal range of motion in all extremities. No joint effusions.  No lower extremity tenderness.  No edema. Neurologic:   Normal speech and language.  CN 2-10 normal. Motor grossly intact. No pronator drift.  Normal gait. No gross focal neurologic deficits are appreciated.  Skin:    Skin is warm, dry and intact. No rash noted.  No petechiae, purpura, or bullae. Psychiatric:   Mood and affect are normal. Speech and behavior are normal. Patient exhibits appropriate insight and judgment.  ____________________________________________    LABS (pertinent positives/negatives) (all labs ordered are listed, but only abnormal results are displayed) Labs Reviewed  COMPREHENSIVE METABOLIC PANEL - Abnormal; Notable for the following:    Sodium 133 (*)     CO2 21 (*)    Calcium 7.2 (*)    Total Protein 5.1 (*)    Albumin 1.7 (*)    AST 65 (*)    Alkaline Phosphatase 141 (*)    Total Bilirubin 28.8 (*)    All other components within normal limits  CBC WITH DIFFERENTIAL/PLATELET - Abnormal; Notable for the following:    RBC 2.97 (*)    Hemoglobin 9.8 (*)    HCT 29.6 (*)    RDW 23.9 (*)    Platelets 39 (*)  Lymphs Abs 0.6 (*)    All other components within normal limits  APTT - Abnormal; Notable for the following:    aPTT 43 (*)    All other components within normal limits  PROTIME-INR - Abnormal; Notable for the following:    Prothrombin Time 20.7 (*)    All other components within normal limits  URINALYSIS COMPLETEWITH MICROSCOPIC ([facility] ONLY) - Abnormal; Notable for the following:    Color, Urine AMBER (*)    APPearance HAZY (*)    Bilirubin Urine 2+ (*)    Hgb urine dipstick 1+ (*)    Leukocytes, UA TRACE (*)    Squamous Epithelial / LPF 6-30 (*)    All other components within normal limits  CULTURE, BLOOD (ROUTINE X 2)  CULTURE, BLOOD (ROUTINE X 2)  CULTURE, EXPECTORATED SPUTUM-ASSESSMENT  URINE CULTURE  LIPASE, BLOOD  TROPONIN I  AMMONIA  LACTIC ACID, PLASMA  LACTIC ACID, PLASMA  TYPE AND SCREEN   ____________________________________________   EKG  Interpreted by me Normal sinus rhythm rate of 65, normal axis intervals and QRS. Normal ST segments. Isolated T-wave inversion in V2 which is nonspecific  ____________________________________________    RADIOLOGY  Chest x-ray reveals bilateral perihilar opacities consistent with multifocal pneumonia  ____________________________________________   PROCEDURES CRITICAL CARE Performed by: Joni Fears, Sukhmani Fetherolf   Total critical care time: 35 minutes  Critical care time was exclusive of separately billable procedures and treating other patients.  Critical care was necessary to treat or prevent imminent or life-threatening deterioration.  Critical care was  time spent personally by me on the following activities: development of treatment plan with patient and/or surrogate as well as nursing, discussions with consultants, evaluation of patient's response to treatment, examination of patient, obtaining history from patient or surrogate, ordering and performing treatments and interventions, ordering and review of laboratory studies, ordering and review of radiographic studies, pulse oximetry and re-evaluation of patient's condition.   ____________________________________________   INITIAL IMPRESSION / ASSESSMENT AND PLAN / ED COURSE  Pertinent labs & imaging results that were available during my care of the patient were reviewed by me and considered in my medical decision making (see chart for details).  Patient presents with hypotension with a blood pressure of 60/40, acute respiratory failure with hypoxia, tachypnea. Concern for sepsis due to pneumonia. Recent hospitalization as well. We'll initiate code sepsis, start with vancomycin and cefepime and Levaquin. Check labs lactate blood cultures chest x-ray. 3 L IV fluid bolus. We'll watch oxygenation on blood pressure closely.  ----------------------------------------- 12:00 PM on 12/19/2015 ----------------------------------------- Blood pressure 80/50, fluid responsive with the first liter, continue IV fluid boluses.  ----------------------------------------- 2:36 PM on 12/19/2015 -----------------------------------------  Most recent blood pressure 80/55. Still good oxygen saturations on nasal cannula O2. No other change in status. We'll continue to monitor a pressure, possibility of placing a central venous catheter discussed with the patient who verbally consents. ----------------------------------------- 3:17 PM on 12/19/2015 -----------------------------------------  Blood pressure now 80/60 with a map of 67. Continues to be fluid responsive and improved. Mental status is still clear and  sharp and patient is feeling better. We will defer on central line for now.     ____________________________________________   FINAL CLINICAL IMPRESSION(S) / ED DIAGNOSES  Final diagnoses:  Alcoholic cirrhosis of liver with ascites ([place])  Hyperbilirubinemia  Acute respiratory failure with hypoxia ([facility])  Sepsis, due to unspecified organism ([place])  Bilateral pneumonia  Urinary tract infection without hematuria, site unspecified      Carrie Mew, MD 12/19/15 1551

## 2015-12-19 NOTE — H&P (Signed)
PULMONOLOGY CONSULT  PATIENT NAME: Abigail Cook    MR#:  [id number]  DATE OF BIRTH:  Sep 19, 1966  DATE OF ADMISSION:  12/19/2015  PRIMARY CARE PHYSICIAN: Hospice  REQUESTING/REFERRING PHYSICIAN: [place]  CHIEF COMPLAINT:   Chief Complaint  Patient presents with  . Shortness of Breath  weakness and could not walk  HISTORY OF PRESENT ILLNESS:  Vertie Krakow  is a 49 y.o. 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  She was found to have o2 sat 86%, with very low BP, patient was given 3 L NS bolus. Patient feels a lot better since admission Patient denies fevers, has chills. No abd pain, no nausea,vomting +lower ext swelling for severel months  Patient being followed by hospice, I have discussed Vent support and chest compressions if needed and patient states that [place]  CXR does not really suggest pneumonia, patient with excessive volume loss.   PAST MEDICAL HISTORY:   Past Medical History  Diagnosis Date  . Hypertension   . Seizures ([place])   . Chronic kidney disease   . Alcoholic cirrhosis of liver ([place])     PAST SURGICAL HISTORY:   Past Surgical History  Procedure Laterality Date  . Rod placed in right leg/foot    . Esophagogastroduodenoscopy Left 11/18/2015    Procedure: ESOPHAGOGASTRODUODENOSCOPY (EGD);  Surgeon: Hulen Luster, MD;  Location: [facility] ENDOSCOPY;  Service: Endoscopy;  Laterality: Left;  Marland Kitchen Mandible surgery      SOCIAL HISTORY:   Social History  Substance Use Topics  . Smoking status: Former Research scientist (life sciences)  . Smokeless tobacco: Not on file  . Alcohol Use: No     Comment: every other day    FAMILY HISTORY:   Family History  Problem Relation Age of Onset  . Cirrhosis Mother   . Cirrhosis Father     DRUG ALLERGIES:  No Known  Allergies  REVIEW OF SYSTEMS:  CONSTITUTIONAL: No fever, positive for fatigue.  EYES: Poor vision EARS, NOSE, AND THROAT: No tinnitus or ear pain. No sore throat RESPIRATORY: -cough and some shortness of breath, no wheezing or hemoptysis.  CARDIOVASCULAR: denies chest pain, no orthopnea, edema.  GASTROINTESTINAL: No nausea, vomiting. + diarrhea and abdominal pain. Bright red blood per rectum GENITOURINARY: Burning on urination ENDOCRINE: No polyuria, nocturia,  HEMATOLOGY: No anemia, easy bruising or bleeding SKIN: No rash or lesion. MUSCULOSKELETAL: No joint pain or arthritis.   NEUROLOGIC: No tingling, numbness, weakness.  PSYCHIATRY: No anxiety or depression.   MEDICATIONS AT HOME:   Prior to Admission medications   Medication Sig Start Date End Date Taking? Authorizing Provider  albuterol (PROVENTIL HFA;VENTOLIN HFA) 108 (90 BASE) MCG/ACT inhaler Inhale 2 puffs into the lungs every 6 (six) hours as needed for wheezing or shortness of breath. 12/02/15  Yes Vaughan Basta, MD  ipratropium-albuterol (DUONEB) 0.5-2.5 (3) MG/3ML SOLN Take 1 vial by nebulization every 6 (six) hours as needed. 12/04/15  Yes Historical Provider, MD  lactulose (CHRONULAC) 10 GM/15ML solution Take 15 mLs (10 g total) by mouth 2 (two) times daily. 12/02/15  Yes Vaughan Basta, MD  Multiple Vitamin (MULTIVITAMIN WITH MINERALS) TABS tablet Take 1 tablet by mouth daily. 12/02/15  Yes Vaughan Basta, MD  mupirocin ointment (BACTROBAN) 2 % Apply 1 application topically 3 (three) times daily. To each nostril  for 1 week 12/12/15 12/19/15 Yes Historical Provider, MD  nadolol (CORGARD) 20 MG tablet Take 1 tablet (20 mg total) by mouth daily. 12/02/15  Yes Vaughan Basta, MD  pantoprazole (PROTONIX) 40 MG tablet Take 1 tablet (40 mg total) by mouth daily. 12/02/15  Yes Vaughan Basta, MD  Potassium Chloride 40 MEQ/15ML (20%) SOLN Take 15 mLs by mouth daily. For one week 12/12/15 12/19/15  Yes Historical Provider, MD      VITAL SIGNS:  Blood pressure 78/40, pulse 63, temperature 97.7 F (36.5 C), temperature source Oral, resp. rate 31, height 5\' 2"  (1.575 m), weight 220 lb (99.791 kg), SpO2 100 %.  PHYSICAL EXAMINATION:  GENERAL:  49 y.o.-year-old patient lying in the bed with no acute distress.  EYES: Pupils equal, round, reactive to light and accommodation. Positive for icterus. Extraocular muscles intact.  HEENT: Head atraumatic, normocephalic. Oropharynx and nasopharynx clear.  NECK:  Supple, no jugular venous distention. No thyroid enlargement, no tenderness.  LUNGS: Normal breath sounds bilaterally, no wheezing, rales,rhonchi or crepitation. No use of accessory muscles of respiration.  CARDIOVASCULAR: S1, S2 normal. No murmurs, rubs, or gallops.  ABDOMEN: Soft, nontender, nondistended. Bowel sounds present. No organomegaly or mass.  EXTREMITIES: 3+ pedal edema, no cyanosis, or clubbing.  NEUROLOGIC: Cranial nerves II through XII are intact. Muscle strength 5/5 in all extremities. Sensation intact. Gait not checked.  PSYCHIATRIC: The patient is alert and oriented x 3.  SKIN: No rash, lesion, or ulcer. Positive for jaundice  LABORATORY PANEL:   CBC  Recent Labs Lab 12/19/15 1114  WBC 7.8  HGB 9.8*  HCT 29.6*  PLT 39*   ------------------------------------------------------------------------------------------------------------------  Chemistries   Recent Labs Lab 12/19/15 1114  NA 133*  K 3.8  CL 105  CO2 21*  GLUCOSE 98  BUN 19  CREATININE UNABLE TO REPORT DUE TO ICTERUS  CALCIUM 7.2*  AST 65*  ALT 51  ALKPHOS 141*  BILITOT 28.8*   ------------------------------------------------------------------------------------------------------------------  Cardiac Enzymes  Recent Labs Lab 12/19/15 1114  TROPONINI 0.03   ------------------------------------------------------------------------------------------------------------------  RADIOLOGY:   Dg Chest Port 1 View  12/19/2015  CLINICAL DATA:  Shortness of breath EXAM: PORTABLE CHEST 1 VIEW COMPARISON:  11/29/2015 chest radiograph. FINDINGS: Stable cardiomediastinal silhouette with top normal heart size. No pneumothorax. No pleural effusion. There hazy patchy parahilar opacities in both lungs. IMPRESSION: Nonspecific hazy patchy parahilar opacities in both lungs, which could represent a developing multifocal pneumonia. Consider short-term follow-up with PA and lateral chest radiographs. Electronically Signed   By: Ilona Sorrel M.D.   On: 12/19/2015 12:47   Images reviewed 12/19/2015  EKG:   Normal sinus rhythm 65 bpm flipped T waves V1 and V2  IMPRESSION AND PLAN:  49 yo AAF with end stage liver cirrhosis from ETOH abuse with acute worsening of chronic diarrhea leading to hypovolumic shock Patient does seem to have septic shock at this time, patient does not need CVL at this time. BP has improved and she is not in shock  1.Shock-hypovolumia -continue IVF's for  now  2.UTI-start rocephin -follow up cultures  3.ESLD-recommend GI consult for med reconciliation  4.LE SWELLING -check ECHO  5.diarrhea with GOB -check for c diff  Prognosis is very poor, patient now DNR as she has expressed this to me and to ICU nurse  I have personally obtained a history, examined the patient, evaluated Pertinent laboratory and RadioGraphic/imaging results, and  formulated the assessment and plan   The Patient requires high complexity decision making for assessment and support,  frequent evaluation and titration of therapies.  Patient satisfied with Plan of action and management. All questions answered  Corrin Parker, M.D.  [facility]  Medical Director [place] Director [facility] Cardio-Pulmonary Department

## 2015-12-19 NOTE — Progress Notes (Signed)
   12/19/15 1900  Clinical Encounter Type  Visited With Patient  Visit Type Initial  Referral From Patient  Consult/Referral To Chaplain  Spiritual Encounters  Spiritual Needs Prayer  Stress Factors  Patient Stress Factors Health changes  Chaplain prayed with patient as requested and provided pastoral care.   Chaplain Jonnette Nuon [phone]

## 2015-12-20 ENCOUNTER — Inpatient Hospital Stay

## 2015-12-20 ENCOUNTER — Inpatient Hospital Stay
Admit: 2015-12-20 | Discharge: 2015-12-20 | Disposition: A | Discharge status: Home / Self Care | Attending: Internal Medicine | Admitting: Internal Medicine

## 2015-12-20 DIAGNOSIS — K7290 Hepatic failure, unspecified without coma: Secondary | ICD-10-CM

## 2015-12-20 DIAGNOSIS — K7031 Alcoholic cirrhosis of liver with ascites: Secondary | ICD-10-CM

## 2015-12-20 DIAGNOSIS — R571 Hypovolemic shock: Secondary | ICD-10-CM

## 2015-12-20 LAB — BASIC METABOLIC PANEL
ANION GAP: 6 (ref 5–15)
BUN: 15 mg/dL (ref 6–20)
CHLORIDE: 106 mmol/L (ref 101–111)
CO2: 20 mmol/L — ABNORMAL LOW (ref 22–32)
CREATININE: UNDETERMINED mg/dL (ref 0.44–1.00)
Calcium: 6.6 mg/dL — ABNORMAL LOW (ref 8.9–10.3)
Glucose, Bld: 106 mg/dL — ABNORMAL HIGH (ref 65–99)
POTASSIUM: 3.5 mmol/L (ref 3.5–5.1)
SODIUM: 132 mmol/L — AB (ref 135–145)

## 2015-12-20 LAB — CBC
HEMATOCRIT: 29.3 % — AB (ref 35.0–47.0)
HEMOGLOBIN: 9.7 g/dL — AB (ref 12.0–16.0)
MCH: 33 pg (ref 26.0–34.0)
MCHC: 33.2 g/dL (ref 32.0–36.0)
MCV: 99.5 fL (ref 80.0–100.0)
PLATELETS: 38 10*3/uL — AB (ref 150–440)
RBC: 2.95 MIL/uL — AB (ref 3.80–5.20)
RDW: 23.6 % — ABNORMAL HIGH (ref 11.5–14.5)
WBC: 9.1 10*3/uL (ref 3.6–11.0)

## 2015-12-20 LAB — C DIFFICILE QUICK SCREEN W PCR REFLEX
C DIFFICILE (CDIFF) INTERP: NEGATIVE
C Diff antigen: NEGATIVE
C Diff toxin: NEGATIVE

## 2015-12-20 MED ORDER — NOREPINEPHRINE BITARTRATE 1 MG/ML IV SOLN
0.0000 ug/min | INTRAVENOUS | Status: DC
  Administered 2015-12-20: 10 ug/min via INTRAVENOUS
  Filled 2015-12-20: qty 16

## 2015-12-20 MED ORDER — FUROSEMIDE 10 MG/ML IJ SOLN
4.0000 mg/h | INTRAMUSCULAR | Status: DC
  Filled 2015-12-20: qty 25

## 2015-12-20 MED ORDER — SODIUM CHLORIDE 0.9 % IV BOLUS (SEPSIS)
1000.0000 mL | Freq: Once | INTRAVENOUS | Status: AC
  Administered 2015-12-20: 1000 mL via INTRAVENOUS

## 2015-12-20 MED ORDER — MUPIROCIN 2 % EX OINT
1.0000 "application " | TOPICAL_OINTMENT | Freq: Two times a day (BID) | CUTANEOUS | Status: DC
  Filled 2015-12-20: qty 22

## 2015-12-20 MED ORDER — CEFAZOLIN SODIUM 1-5 GM-% IV SOLN
1.0000 g | Freq: Two times a day (BID) | INTRAVENOUS | Status: DC
  Administered 2015-12-20 – 2015-12-23 (×7): 1 g via INTRAVENOUS
  Filled 2015-12-20 (×8): qty 50

## 2015-12-20 MED ORDER — CHLORHEXIDINE GLUCONATE CLOTH 2 % EX PADS: 6.0000 | MEDICATED_PAD | Freq: Every day | CUTANEOUS | Status: DC

## 2015-12-20 MED ORDER — IPRATROPIUM-ALBUTEROL 0.5-2.5 (3) MG/3ML IN SOLN
3.0000 mL | Freq: Four times a day (QID) | RESPIRATORY_TRACT | Status: DC | PRN
  Administered 2015-12-24: 3 mL via RESPIRATORY_TRACT
  Filled 2015-12-20: qty 3

## 2015-12-20 NOTE — Consult Note (Signed)
GI Inpatient Consult Note  Reason for Consult:   Attending Requesting Consult:  History of Present Illness: Abigail Cook is a 49 y.o. female with decomensated ETOH cirrhosis a/w SOB and chronic diarrhea.  Was recently admitted early December for SOB and was treated for Pna..  Had very high MELD at that time.    Currently again report SOB but this hospitalization having hypotension requiring pressors.  Thought to be due to diarhea.  CXR this admission negative for Pna.  SOB thought due to hypovolemia.  She is difficult historian but tells me she only had one stool today. Denies blood in stool, black stools, abd pain, n/v, abd swelling.  C. Diff is negative this hospitalization.  Has not yet had ruq [place].  No ascites on u/s from early December admission.  Liver labs essentially unchanged from prior admission.  Reports last ETOH was November 2016.   Being followed by hospice for poor prognosis and made DNR today per her wishes.    Past Medical History:  Past Medical History  Diagnosis Date  . Hypertension   . Seizures ([place])   . Chronic kidney disease   . Alcoholic cirrhosis of liver ([place])     Problem List: Patient Active Problem List   Diagnosis Date Noted  . Hypovolemic shock ([place]) 12/20/2015  . Sepsis ([place]) 12/19/2015  . Acute respiratory failure ([place]) 11/29/2015  . Pressure ulcer 11/19/2015  . Hyperbilirubinemia   . Alcoholic cirrhosis of liver with ascites ([place])   . Hepatic encephalopathy ([place]) 11/06/2015    Past Surgical History: Past Surgical History  Procedure Laterality Date  . Rod placed in right leg/foot    . Esophagogastroduodenoscopy Left 11/18/2015    Procedure: ESOPHAGOGASTRODUODENOSCOPY (EGD);  Surgeon: Hulen Luster, MD;  Location: [facility] ENDOSCOPY;  Service: Endoscopy;  Laterality: Left;  Marland Kitchen Mandible surgery      Allergies: No Known Allergies  Home Medications: Prescriptions prior to admission  Medication Sig Dispense Refill Last Dose  . ipratropium-albuterol  (DUONEB) 0.5-2.5 (3) MG/3ML SOLN Take 1 vial by nebulization every 6 (six) hours as needed.  4 12/18/2015 at Unknown time  . lactulose (CHRONULAC) 10 GM/15ML solution Take 15 mLs (10 g total) by mouth 2 (two) times daily. 240 mL 2 12/18/2015 at Unknown time  . Multiple Vitamin (MULTIVITAMIN WITH MINERALS) TABS tablet Take 1 tablet by mouth daily. 30 tablet 1 12/18/2015 at Unknown time  . [EXPIRED] mupirocin ointment (BACTROBAN) 2 % Apply 1 application topically 3 (three) times daily. To each nostril for 1 week  0 12/18/2015 at Unknown time  . nadolol (CORGARD) 20 MG tablet Take 1 tablet (20 mg total) by mouth daily. 30 tablet 1 12/19/2015 at Unknown time  . pantoprazole (PROTONIX) 40 MG tablet Take 1 tablet (40 mg total) by mouth daily. 30 tablet 0 12/19/2015 at Unknown time  . [EXPIRED] Potassium Chloride 40 MEQ/15ML (20%) SOLN Take 15 mLs by mouth daily. For one week  0 12/19/2015 at Unknown time  . albuterol (PROVENTIL HFA;VENTOLIN HFA) 108 (90 BASE) MCG/ACT inhaler Inhale 2 puffs into the lungs every 6 (six) hours as needed for wheezing or shortness of breath. (Patient not taking: Reported on 12/19/2015) 1 Inhaler 2 Not Taking at Unknown time   Home medication reconciliation was not completed with the patient.   Scheduled Inpatient Medications:   .  ceFAZolin (ANCEF) IV  1 g Intravenous Q12H  . lactulose  10 g Oral BID  . multivitamin with minerals  1 tablet Oral Daily  .  pantoprazole  40 mg Oral QAC breakfast  . predniSONE  50 mg Oral Q breakfast  . sodium chloride  3 mL Intravenous Q12H    Continuous Inpatient Infusions:   . sodium chloride 100 mL/hr at 12/20/15 0809  . furosemide (LASIX) infusion    . norepinephrine (LEVOPHED) Adult infusion 6 mcg/min (12/20/15 1841)  . phenylephrine (NEO-SYNEPHRINE) Adult infusion Stopped (12/20/15 0910)    PRN Inpatient Medications:  ipratropium-albuterol, ondansetron **OR** ondansetron (ZOFRAN) IV, oxyCODONE  Family History: family history  includes Cirrhosis in her father and mother.  The patient's family history is negative for inflammatory bowel disorders, GI malignancy, or solid organ transplantation.  Social History:   reports that she has quit smoking. She does not have any smokeless tobacco history on file. She reports that she does not drink alcohol or use illicit drugs. Reports last ETOH 10/2015.   Review of Systems: Constitutional: Weight is decreased.  Eyes: No changes in vision. ENT: No oral lesions, sore throat.  GI: see HPI.  Heme/Lymph: +easy bruising.  CV: No chest pain.  GU: No hematuria.  Integumentary: No rashes.  Neuro: No headaches.  Psych: No depression/anxiety.  Endocrine: No heat/cold intolerance.  Allergic/Immunologic: No urticaria.  Resp: +cough, + SOB.  Musculoskeletal: No joint swelling.    Physical Examination: BP 92/56 mmHg  Pulse 52  Temp(Src) 98.7 F (37.1 C) (Axillary)  Resp 21  Ht 5\' 2"  (1.575 m)  Wt 99.791 kg (220 lb)  BMI 40.23 kg/m2  SpO2 100% Gen: NAD, alert and oriented x 2, appears chronically ill HEENT:EOMI, + scleral icterus Neck: supple, no JVD or thyromegaly Chest: CTA bilaterally, no wheezes, crackles, or other adventitious sounds CV: RRR, no m/g/c/r Abd: soft, NT, ND, +BS in all four quadrants; no HSM, guarding, ridigity, or rebound tenderness Ext: no edema, well perfused with 2+ pulses, Skin: no rash or lesions noted Lymph: no LAD Neuro: no asterixis, grossly non-focal.   Data: Lab Results  Component Value Date   WBC 9.1 12/20/2015   HGB 9.7* 12/20/2015   HCT 29.3* 12/20/2015   MCV 99.5 12/20/2015   PLT 38* 12/20/2015    Recent Labs Lab 12/19/15 1114 12/20/15 0403  HGB 9.8* 9.7*   Lab Results  Component Value Date   NA 132* 12/20/2015   K 3.5 12/20/2015   CL 106 12/20/2015   CO2 20* 12/20/2015   BUN 15 12/20/2015   CREATININE UNABLE TO REPORT DUE TO ICTERUS 12/20/2015   Lab Results  Component Value Date   ALT 51 12/19/2015   AST 65*  12/19/2015   ALKPHOS 141* 12/19/2015   BILITOT 28.8* 12/19/2015    Recent Labs Lab 12/19/15 1114  APTT 43*  INR 1.78   Assessment/Plan: Ms. Scherff is a 49 y.o. female with decompensated ETOH cirrhosis a/w SOB and found to be hypotenisve and requring pressors, thought to be from hypovolemia from diarrhea.  No blood in stools, no change in hgb from previous admission.  She has a poor prognosis with high MELD and multiple comorbidities. Not to further quantify the amount of stool output.    Recommendations: - obtain ruq u/s to r/o ascites and SBP - watch for early signs H.E. - agree with holding nadolol and diuretics for now - send stool culture or GI panel - strict documentation of stool output.  - ETOH abstinence.  - stop lactulose for now since diarrhea thought to be causing hypovolemia   Thank you for the consult. Please call with questions or concerns.  Emeli Goguen,  Grace Blight, MD

## 2015-12-20 NOTE — Procedures (Signed)
Central Venous Catheter Placement: Indication: Patient receiving vesicant or irritant drug.; Patient receiving intravenous therapy for longer than 5 days.; Patient has limited or no vascular access.   Consent:verbal  Risks and benefits explained in detail including risk of infection, bleeding, respiratory failure and death..   Hand washing performed prior to starting the procedure.   Procedure: An active timeout was performed and correct patient, name, & ID confirmed.  After explaining risk and benefits, patient was positioned correctly for central venous access. Patient was prepped using strict sterile technique including chlorohexadine preps, sterile drape, sterile gown and sterile gloves.  The area was prepped, draped and anesthetized in the usual sterile manner. Patient comfort was obtained.  A triple lumen catheter was placed in LEFT Internal Jugular Vein There was good blood return, catheter caps were placed on lumens, catheter flushed easily, the line was secured and a sterile dressing and BIO-PATCH applied.   Ultrasound was used to visualize vasculature and guidance of needle.   Number of Attempts: 1 Complications:none Estimated Blood Loss: none Chest Radiograph indicated and ordered.  Operator: Sianni Cloninger.   AmeLie Hollars David Sienna Stonehocker, M.D.  [place]  Medical Director ICU-[facility] [place] Medical Director [facility] Cardio-Pulmonary Department     

## 2015-12-20 NOTE — H&P (Signed)
PULMONOLOGY CONSULT  PATIENT NAME: Abigail Cook    MR#:  [id number]  DATE OF BIRTH:  1966-07-29  DATE OF ADMISSION:  12/19/2015  PRIMARY CARE PHYSICIAN: Hospice  REQUESTING/REFERRING PHYSICIAN: [place]  CHIEF COMPLAINT:   Chief Complaint  Patient presents with  . Shortness of Breath  weakness and could not walk  HISTORY OF PRESENT ILLNESS:  Low bP last night, started on vasopressors Will place CVL Alert and awake     DRUG ALLERGIES:  No Known Allergies  REVIEW OF SYSTEMS:  CONSTITUTIONAL: No fever, positive for fatigue.  EYES: Poor vision EARS, NOSE, AND THROAT: No tinnitus or ear pain. No sore throat RESPIRATORY: -cough and some shortness of breath, no wheezing or hemoptysis.  CARDIOVASCULAR: denies chest pain, no orthopnea, edema.  GASTROINTESTINAL: No nausea, vomiting. + diarrhea and abdominal pain. Bright red blood per rectum GENITOURINARY: Burning on urination ENDOCRINE: No polyuria, nocturia,  HEMATOLOGY: No anemia, easy bruising or bleeding SKIN: No rash or lesion. ALL OTHER ROS NEGATIVE  MEDICATIONS AT HOME:   Prior to Admission medications   Medication Sig Start Date End Date Taking? Authorizing Provider  albuterol (PROVENTIL HFA;VENTOLIN HFA) 108 (90 BASE) MCG/ACT inhaler Inhale 2 puffs into the lungs every 6 (six) hours as needed for wheezing or shortness of breath. 12/02/15  Yes Vaughan Basta, MD  ipratropium-albuterol (DUONEB) 0.5-2.5 (3) MG/3ML SOLN Take 1 vial by nebulization every 6 (six) hours as needed. 12/04/15  Yes Historical Provider, MD  lactulose (CHRONULAC) 10 GM/15ML solution Take 15 mLs (10 g total) by mouth 2 (two) times daily. 12/02/15  Yes Vaughan Basta, MD  Multiple Vitamin (MULTIVITAMIN WITH MINERALS) TABS tablet Take 1 tablet by mouth daily. 12/02/15  Yes Vaughan Basta, MD  mupirocin ointment (BACTROBAN) 2 % Apply 1 application topically 3 (three) times daily. To each nostril for 1 week 12/12/15 12/19/15 Yes  Historical Provider, MD  nadolol (CORGARD) 20 MG tablet Take 1 tablet (20 mg total) by mouth daily. 12/02/15  Yes Vaughan Basta, MD  pantoprazole (PROTONIX) 40 MG tablet Take 1 tablet (40 mg total) by mouth daily. 12/02/15  Yes Vaughan Basta, MD  Potassium Chloride 40 MEQ/15ML (20%) SOLN Take 15 mLs by mouth daily. For one week 12/12/15 12/19/15 Yes Historical Provider, MD      VITAL SIGNS:  Blood pressure 67/45, pulse 63, temperature 98.3 F (36.8 C), temperature source Oral, resp. rate 22, height 5\' 2"  (1.575 m), weight 220 lb (99.791 kg), SpO2 100 %.  PHYSICAL EXAMINATION:  GENERAL:  49 y.o.-year-old patient lying in the bed with no acute distress.  EYES: Pupils equal, round, reactive to light and accommodation. Positive for icterus. Extraocular muscles intact.  HEENT: Head atraumatic, normocephalic. Oropharynx and nasopharynx clear.  NECK:  Supple, no jugular venous distention. No thyroid enlargement, no tenderness.  LUNGS: Normal breath sounds bilaterally, no wheezing, rales,rhonchi or crepitation. No use of accessory muscles of respiration.  CARDIOVASCULAR: S1, S2 normal. No murmurs, rubs, or gallops.  ABDOMEN: Soft, nontender, nondistended. Bowel sounds present. No organomegaly or mass.  EXTREMITIES: 3+ pedal edema, no cyanosis, or clubbing.  NEUROLOGIC: Cranial nerves II through XII are intact. Muscle strength 5/5 in all extremities. Sensation intact. Gait not checked.  PSYCHIATRIC: The patient is alert and oriented x 3.  SKIN: No rash, lesion, or ulcer. Positive for jaundice  LABORATORY PANEL:   CBC  Recent Labs Lab 12/20/15 0403  WBC 9.1  HGB 9.7*  HCT 29.3*  PLT 38*   ------------------------------------------------------------------------------------------------------------------  Chemistries   Recent Labs Lab  12/19/15 1114 12/20/15 0403  NA 133* 132*  K 3.8 3.5  CL 105 106  CO2 21* 20*  GLUCOSE 98 106*  BUN 19 15  CREATININE UNABLE TO  REPORT DUE TO ICTERUS UNABLE TO REPORT DUE TO ICTERUS  CALCIUM 7.2* 6.6*  AST 65*  --   ALT 51  --   ALKPHOS 141*  --   BILITOT 28.8*  --    ------------------------------------------------------------------------------------------------------------------  Cardiac Enzymes  Recent Labs Lab 12/19/15 1114  TROPONINI 0.03   ------------------------------------------------------------------------------------------------------------------  RADIOLOGY:  Dg Chest Port 1 View  12/19/2015  CLINICAL DATA:  Shortness of breath EXAM: PORTABLE CHEST 1 VIEW COMPARISON:  11/29/2015 chest radiograph. FINDINGS: Stable cardiomediastinal silhouette with top normal heart size. No pneumothorax. No pleural effusion. There hazy patchy parahilar opacities in both lungs. IMPRESSION: Nonspecific hazy patchy parahilar opacities in both lungs, which could represent a developing multifocal pneumonia. Consider short-term follow-up with PA and lateral chest radiographs. Electronically Signed   By: Ilona Sorrel M.D.   On: 12/19/2015 12:47   Images reviewed 12/20/2015  EKG:   Normal sinus rhythm 65 bpm flipped T waves V1 and V2  IMPRESSION AND PLAN:  49 yo AAF with end stage liver cirrhosis from ETOH abuse with acute worsening of chronic diarrhea leading to hypovolumic shock Will place CVL  1.Shock-hypovolumia -continue IVF's for  Now -place CVL  2.UTI-start rocephin -follow up cultures  3.ESLD-recommend GI consult for med reconciliation  4.LE SWELLING -check ECHO  5.diarrhea with GOB -check for c diff  Prognosis is very poor, patient now DNR as she has expressed this to me and to ICU nurse The Patient requires high complexity decision making for assessment and support, frequent evaluation and titration of therapies.  Corrin Parker, M.D.  [facility]  Medical Director [place] Director [facility] Cardio-Pulmonary Department

## 2015-12-20 NOTE — Progress Notes (Addendum)
[place] [place] NAME: Abigail Cook    MR#:  [id number]  DATE OF BIRTH:  1966-10-01  SUBJECTIVE:   Patient denies dizziness however she does report swelling in her lower extremity and abdomen. Patient denies abdominal pain.  Vision had low blood pressure and has been started on vasopressors.  REVIEW OF SYSTEMS:    Review of Systems  Constitutional: Negative for fever, chills and malaise/fatigue.  HENT: Negative for sore throat.   Eyes: Negative for blurred vision.  Respiratory: Negative for cough, hemoptysis, shortness of breath and wheezing.   Cardiovascular: Positive for leg swelling. Negative for chest pain and palpitations.  Gastrointestinal: Negative for nausea, vomiting, abdominal pain, diarrhea and blood in stool.  Genitourinary: Negative for dysuria.  Musculoskeletal: Negative for back pain.  Neurological: Negative for dizziness, tremors and headaches.  Endo/Heme/Allergies: Does not bruise/bleed easily.    Tolerating Diet: Yes      DRUG ALLERGIES:  No Known Allergies  VITALS:  Blood pressure 75/51, pulse 69, temperature 98.3 F (36.8 C), temperature source Oral, resp. rate 24, height 5\' 2"  (1.575 m), weight 99.791 kg (220 lb), SpO2 100 %.  PHYSICAL EXAMINATION:   Physical Exam  Constitutional: She is oriented to person, place, and time and well-developed, well-nourished, and in no distress. No distress.  HENT:  Head: Normocephalic.  Eyes: Scleral icterus is present.  Neck: Normal range of motion. Neck supple. No JVD present. No tracheal deviation present.  Cardiovascular: Normal rate, regular rhythm and normal heart sounds.  Exam reveals no gallop and no friction rub.   No murmur heard. Pulmonary/Chest: Effort normal and breath sounds normal. No respiratory distress. She has no wheezes. She has no rales. She exhibits no tenderness.  Abdominal: Soft. Bowel sounds are normal. She exhibits distension. She  exhibits no mass. There is no tenderness. There is no rebound and no guarding.  Musculoskeletal: Normal range of motion. She exhibits edema.  Neurological: She is alert and oriented to person, place, and time.  Skin: Skin is warm. No rash noted. No erythema.  Jaundice  Psychiatric: Affect and judgment normal.      LABORATORY PANEL:   CBC  Recent Labs Lab 12/20/15 0403  WBC 9.1  HGB 9.7*  HCT 29.3*  PLT 38*   ------------------------------------------------------------------------------------------------------------------  Chemistries   Recent Labs Lab 12/19/15 1114 12/20/15 0403  NA 133* 132*  K 3.8 3.5  CL 105 106  CO2 21* 20*  GLUCOSE 98 106*  BUN 19 15  CREATININE UNABLE TO REPORT DUE TO ICTERUS UNABLE TO REPORT DUE TO ICTERUS  CALCIUM 7.2* 6.6*  AST 65*  --   ALT 51  --   ALKPHOS 141*  --   BILITOT 28.8*  --    ------------------------------------------------------------------------------------------------------------------  Cardiac Enzymes  Recent Labs Lab 12/19/15 1114  TROPONINI 0.03   ------------------------------------------------------------------------------------------------------------------  RADIOLOGY:  Dg Chest Port 1 View  12/19/2015  CLINICAL DATA:  Shortness of breath EXAM: PORTABLE CHEST 1 VIEW COMPARISON:  11/29/2015 chest radiograph. FINDINGS: Stable cardiomediastinal silhouette with top normal heart size. No pneumothorax. No pleural effusion. There hazy patchy parahilar opacities in both lungs. IMPRESSION: Nonspecific hazy patchy parahilar opacities in both lungs, which could represent a developing multifocal pneumonia. Consider short-term follow-up with PA and lateral chest radiographs. Electronically Signed   By: Ilona Sorrel M.D.   On: 12/19/2015 12:47     ASSESSMENT AND PLAN:    49 year old female with a history of EtOH liver cirrhosis who  presents with hypovolemic shock.  1. Hypovolemic shock: This is secondary to chronic  diarrhea. Continue IV fluids and pressors.  2. Urinary tract infection: Continue Ancef.  3. End-stage liver disease: Patient has lower extremity edema and ascites on physical examination. . GI consultation will be placed. 2-D echocardiogram is also pending for lower extremity edema. Creatinine cannot be catheter related due to elevated bilirubin.  She is being followed by hospice. 4. Diarrhea: Patient is being evaluated for C. difficile.    5. Hyponatremia: This is secondary to liver cirrhosis.  6. Thrombocytopenia: This is secondary to end-stage liver disease.  Management plans discussed with the patient and she is in agreement.  CODE STATUS: DNR  CRITICAL TOTAL TIME TAKING CARE OF THIS PATIENT: 40 minutes.     POSSIBLE D/C 3-4 days , DEPENDING ON CLINICAL CONDITION.   Gertha Lichtenberg M.D on 12/20/2015 at 12:26 PM  Between 7am to 6pm - Pager - [phone] After 6pm go to [email or web address] - password [email or web address] [facility]  [facility]  Office  [phone]  CC: Primary care physician; No PCP Per Patient  Note: This dictation was prepared with Dragon dictation along with smaller phrase technology. Any transcriptional errors that result from this process are unintentional.

## 2015-12-20 NOTE — Progress Notes (Signed)
Visit made. Patient seen lying in bed, portable chest xray just completed. She denied pain, was alert but sleepy. Lights dimmed. Breathing noted to be noisy, patient did not complain of and shortness of breath, of note she does use oxygen at 2 liters at home, saturations 100% on room air at visit. Blood pressure remained low overnight, patient has been started on vasopressors for blood pressure support, requiring a central line to be placed. She remains on IV fluids and antibiotics. C-diff culture pending. No family/friends at bedside.Patient aware that hospice will continue to follow and update hospice team. Flo Shanks RN, BSN, [facility], hospital Liaison [phone]

## 2015-12-21 ENCOUNTER — Inpatient Hospital Stay

## 2015-12-21 LAB — COMPREHENSIVE METABOLIC PANEL
ALBUMIN: 1.3 g/dL — AB (ref 3.5–5.0)
ALK PHOS: 132 U/L — AB (ref 38–126)
ALT: 38 U/L (ref 14–54)
AST: 53 U/L — ABNORMAL HIGH (ref 15–41)
Anion gap: 7 (ref 5–15)
BUN: 16 mg/dL (ref 6–20)
CHLORIDE: 112 mmol/L — AB (ref 101–111)
CO2: 13 mmol/L — AB (ref 22–32)
Calcium: 6.5 mg/dL — ABNORMAL LOW (ref 8.9–10.3)
GLUCOSE: 125 mg/dL — AB (ref 65–99)
Potassium: 3.5 mmol/L (ref 3.5–5.1)
SODIUM: 132 mmol/L — AB (ref 135–145)
Total Bilirubin: 22.8 mg/dL (ref 0.3–1.2)
Total Protein: 4.4 g/dL — ABNORMAL LOW (ref 6.5–8.1)

## 2015-12-21 LAB — CBC
HCT: 29.1 % — ABNORMAL LOW (ref 35.0–47.0)
HEMOGLOBIN: 9.7 g/dL — AB (ref 12.0–16.0)
MCH: 32.5 pg (ref 26.0–34.0)
MCHC: 33.3 g/dL (ref 32.0–36.0)
MCV: 97.8 fL (ref 80.0–100.0)
PLATELETS: 30 10*3/uL — AB (ref 150–440)
RBC: 2.98 MIL/uL — AB (ref 3.80–5.20)
RDW: 22.7 % — ABNORMAL HIGH (ref 11.5–14.5)
WBC: 10.9 10*3/uL (ref 3.6–11.0)

## 2015-12-21 LAB — URINE CULTURE

## 2015-12-21 NOTE — H&P (Signed)
PULMONOLOGY CONSULT  PATIENT NAME: Abigail Cook    MR#:  [id number]  DATE OF BIRTH:  August 10, 1966  DATE OF ADMISSION:  12/19/2015  PRIMARY CARE PHYSICIAN: Hospice  REQUESTING/REFERRING PHYSICIAN: [place]  CHIEF COMPLAINT:   Chief Complaint  Patient presents with  . Shortness of Breath  weakness and could not walk  HISTORY OF PRESENT ILLNESS:  Low bP last night, remains on vasopressors Alert and awake Will keep MAP>55    DRUG ALLERGIES:  No Known Allergies  REVIEW OF SYSTEMS:  CONSTITUTIONAL: No fever, positive for fatigue.  EYES: Poor vision EARS, NOSE, AND THROAT: No tinnitus or ear pain. No sore throat RESPIRATORY: -cough and some shortness of breath, no wheezing or hemoptysis.  CARDIOVASCULAR: denies chest pain, no orthopnea, edema.  GASTROINTESTINAL: No nausea, vomiting. + diarrhea and abdominal pain. Bright red blood per rectum GENITOURINARY: Burning on urination ENDOCRINE: No polyuria, nocturia,  HEMATOLOGY: No anemia, easy bruising or bleeding SKIN: No rash or lesion. ALL OTHER ROS NEGATIVE  MEDICATIONS AT HOME:   Prior to Admission medications   Medication Sig Start Date End Date Taking? Authorizing Provider  albuterol (PROVENTIL HFA;VENTOLIN HFA) 108 (90 BASE) MCG/ACT inhaler Inhale 2 puffs into the lungs every 6 (six) hours as needed for wheezing or shortness of breath. 12/02/15  Yes Vaughan Basta, MD  ipratropium-albuterol (DUONEB) 0.5-2.5 (3) MG/3ML SOLN Take 1 vial by nebulization every 6 (six) hours as needed. 12/04/15  Yes Historical Provider, MD  lactulose (CHRONULAC) 10 GM/15ML solution Take 15 mLs (10 g total) by mouth 2 (two) times daily. 12/02/15  Yes Vaughan Basta, MD  Multiple Vitamin (MULTIVITAMIN WITH MINERALS) TABS tablet Take 1 tablet by mouth daily. 12/02/15  Yes Vaughan Basta, MD  mupirocin ointment (BACTROBAN) 2 % Apply 1 application topically 3 (three) times daily. To each nostril for 1 week 12/12/15 12/19/15 Yes  Historical Provider, MD  nadolol (CORGARD) 20 MG tablet Take 1 tablet (20 mg total) by mouth daily. 12/02/15  Yes Vaughan Basta, MD  pantoprazole (PROTONIX) 40 MG tablet Take 1 tablet (40 mg total) by mouth daily. 12/02/15  Yes Vaughan Basta, MD  Potassium Chloride 40 MEQ/15ML (20%) SOLN Take 15 mLs by mouth daily. For one week 12/12/15 12/19/15 Yes Historical Provider, MD      VITAL SIGNS:  Blood pressure 93/64, pulse 57, temperature 96.4 F (35.8 C), temperature source Axillary, resp. rate 20, height 5\' 2"  (1.575 m), weight 220 lb (99.791 kg), SpO2 100 %.  PHYSICAL EXAMINATION:  GENERAL:  49 y.o.-year-old patient lying in the bed with no acute distress.  EYES: Pupils equal, round, reactive to light and accommodation. Positive for icterus. Extraocular muscles intact.  HEENT: Head atraumatic, normocephalic. Oropharynx and nasopharynx clear.  NECK:  Supple, no jugular venous distention. No thyroid enlargement, no tenderness.  LUNGS: Normal breath sounds bilaterally, no wheezing, rales,rhonchi or crepitation. No use of accessory muscles of respiration.  CARDIOVASCULAR: S1, S2 normal. No murmurs, rubs, or gallops.  ABDOMEN: Soft, nontender, nondistended. Bowel sounds present. No organomegaly or mass.  EXTREMITIES: 3+ pedal edema, no cyanosis, or clubbing.  NEUROLOGIC: Cranial nerves II through XII are intact. Muscle strength 5/5 in all extremities. Sensation intact. Gait not checked.    LABORATORY PANEL:   CBC  Recent Labs Lab 12/21/15 0631  WBC 10.9  HGB 9.7*  HCT 29.1*  PLT 30*   ------------------------------------------------------------------------------------------------------------------  Chemistries   Recent Labs Lab 12/19/15 1114 12/20/15 0403  NA 133* 132*  K 3.8 3.5  CL 105 106  CO2 21*  20*  GLUCOSE 98 106*  BUN 19 15  CREATININE UNABLE TO REPORT DUE TO ICTERUS UNABLE TO REPORT DUE TO ICTERUS  CALCIUM 7.2* 6.6*  AST 65*  --   ALT 51  --    ALKPHOS 141*  --   BILITOT 28.8*  --    ------------------------------------------------------------------------------------------------------------------  Cardiac Enzymes  Recent Labs Lab 12/19/15 1114  TROPONINI 0.03   ------------------------------------------------------------------------------------------------------------------  RADIOLOGY:  US Venous Img Lower Bilateral  12/20/2015  CLINICAL DATA:  Bilateral lower extremity pain EXAM: BILATERAL LOWER EXTREMITY VENOUS DUPLEX ULTRASOUND TECHNIQUE: Doppler venous assessment of the bilateral lower extremity deep venous system was performed, including characterization of spectral flow, compressibility, and phasicity. COMPARISON:  None. FINDINGS: There is complete compressibility of the bilateral common femoral, femoral, and popliteal veins. Doppler analysis demonstrates respiratory phasicity and augmentation of flow with calf compression. No evidence of calf vein DVT. IMPRESSION: No evidence of lower extremity DVT. Electronically Signed   By: Marybelle Killings M.D.   On: 12/20/2015 16:02   Dg Chest Port 1 View  12/20/2015  CLINICAL DATA:  Central line placement. History of hypertension, chronic kidney disease. EXAM: PORTABLE CHEST 1 VIEW COMPARISON:  Chest x-rays dated 12/19/2015 and 11/29/2015. FINDINGS: Left internal jugular central line is been placed with tip well positioned at the expected level of the cavoatrial junction. No pneumothorax seen. Study is hypoinspiratory with crowding of the perihilar bronchovascular markings. Given the low lung volumes, lungs appear clear. No evidence of pneumonia. No pleural effusion seen. Cardiomediastinal silhouette is stable in size and configuration. IMPRESSION: 1. Left internal jugular central line appears well positioned with tip at the expected level of the cavoatrial junction. No pneumothorax or other procedural complicating feature seen. 2. Lungs appear clear and there is no evidence of acute  cardiopulmonary abnormality. Electronically Signed   By: Franki Cabot M.D.   On: 12/20/2015 12:56   Dg Chest Port 1 View  12/19/2015  CLINICAL DATA:  Shortness of breath EXAM: PORTABLE CHEST 1 VIEW COMPARISON:  11/29/2015 chest radiograph. FINDINGS: Stable cardiomediastinal silhouette with top normal heart size. No pneumothorax. No pleural effusion. There hazy patchy parahilar opacities in both lungs. IMPRESSION: Nonspecific hazy patchy parahilar opacities in both lungs, which could represent a developing multifocal pneumonia. Consider short-term follow-up with PA and lateral chest radiographs. Electronically Signed   By: Ilona Sorrel M.D.   On: 12/19/2015 12:47   CXR 12/29 images reviewed 12/21/2015     IMPRESSION AND PLAN:  49 yo AAF with end stage liver cirrhosis from ETOH abuse with acute worsening of chronic diarrhea leading to hypovolumic shock -resolving slowly  1.Shock-hypovolumia -continue IVF's for  Now -check CVP Plan to wean off vasopressors to keep MAP>55  2.UTI-cont abx now on ancef -follow up cultures  3.ESLD-recommend GI consult for med reconciliation  4.LE SWELLING - ECHO grade 1 diastolic dysfunction, nl EF  5.diarrhea with GiB c diff neg -follow up GI recs  Prognosis is very poor, patient now DNR/DNI as she has expressed this to me and to ICU nurse The Patient requires high complexity decision making for assessment and support, frequent evaluation and titration of therapies.  Corrin Parker, M.D.  [facility]  Medical Director [place] Director [facility] Cardio-Pulmonary Department

## 2015-12-21 NOTE — Progress Notes (Signed)
GI Note:  Diarrhea now resovled off lactulose.  RUQ us/ does show ascites.   Meds;  .  ceFAZolin (ANCEF) IV  1 g Intravenous Q12H  . multivitamin with minerals  1 tablet Oral Daily  . pantoprazole  40 mg Oral QAC breakfast  . predniSONE  50 mg Oral Q breakfast  . sodium chloride  3 mL Intravenous Q12H    Recs: - diagnositc only paracentesis to eval for SBP - cont holding lactulose for now, watch for s/s hepatic encephalopathy - cont holding nadolol

## 2015-12-21 NOTE — Progress Notes (Signed)
[place] [facility] [place] NAME: Abigail Cook    MR#:  [id number]  DATE OF BIRTH:  [birth year]  SUBJECTIVE:   Patient no longer having diarrhea. She remains on vasopressors.  REVIEW OF SYSTEMS:    Review of Systems  Constitutional: Negative for fever, chills and malaise/fatigue.  HENT: Negative for sore throat.   Eyes: Negative for blurred vision.  Respiratory: Negative for cough, hemoptysis, shortness of breath and wheezing.   Cardiovascular: Positive for leg swelling. Negative for chest pain and palpitations.  Gastrointestinal: Negative for nausea, vomiting, abdominal pain, diarrhea and blood in stool.  Genitourinary: Negative for dysuria.  Musculoskeletal: Negative for back pain.  Neurological: Negative for dizziness, tremors and headaches.  Endo/Heme/Allergies: Does not bruise/bleed easily.    Tolerating Diet: Yes      DRUG ALLERGIES:  No Known Allergies  VITALS:  Blood pressure 92/51, pulse 57, temperature 97.6 F (36.4 C), temperature source Oral, resp. rate 16, height 5\' 2"  (1.575 m), weight 99.791 kg (220 lb), SpO2 100 %.  PHYSICAL EXAMINATION:   Physical Exam  Constitutional: She is oriented to person, place, and time and well-developed, well-nourished, and in no distress. No distress.  HENT:  Head: Normocephalic.  Eyes: Scleral icterus is present.  Neck: Normal range of motion. Neck supple. No JVD present. No tracheal deviation present.  Cardiovascular: Normal rate, regular rhythm and normal heart sounds.  Exam reveals no gallop and no friction rub.   No murmur heard. Pulmonary/Chest: Effort normal and breath sounds normal. No respiratory distress. She has no wheezes. She has no rales. She exhibits no tenderness.  Abdominal: Soft. Bowel sounds are normal. She exhibits distension. She exhibits no mass. There is no tenderness. There is no rebound and no guarding.  Musculoskeletal: Normal range of motion. She  exhibits edema.  Neurological: She is alert and oriented to person, place, and time.  Skin: Skin is warm. No rash noted. No erythema.  Jaundice  Psychiatric: Affect and judgment normal.      LABORATORY PANEL:   CBC  Recent Labs Lab 12/21/15 0631  WBC 10.9  HGB 9.7*  HCT 29.1*  PLT 30*   ------------------------------------------------------------------------------------------------------------------  Chemistries   Recent Labs Lab 12/21/15 0631  NA 132*  K 3.5  CL 112*  CO2 13*  GLUCOSE 125*  BUN 16  CREATININE NOT CALCULATED  CALCIUM 6.5*  AST 53*  ALT 38  ALKPHOS 132*  BILITOT 22.8*   ------------------------------------------------------------------------------------------------------------------  Cardiac Enzymes  Recent Labs Lab 12/19/15 1114  TROPONINI 0.03   ------------------------------------------------------------------------------------------------------------------  RADIOLOGY:  US Abdomen Limited  12/21/2015  CLINICAL DATA:  Hepatic cirrhosis, ascites. EXAM: LIMITED ABDOMEN ULTRASOUND FOR ASCITES TECHNIQUE: Limited ultrasound survey for ascites was performed in all four abdominal quadrants. COMPARISON:  Ultrasound of November 07, 2015. FINDINGS: Nodular hepatic margins are again noted suggesting hepatic cirrhosis. Mild to moderate ascites is noted in the right upper quadrant. Mild to moderate ascites is noted in the right lower quadrant. Minimal ascites is noted in the left upper and lower quadrants. IMPRESSION: Ascites is noted, most prominently seen in the right upper and lower quadrants. Electronically Signed   By: Marijo Conception, M.D.   On: 12/21/2015 08:36   US Venous Img Lower Bilateral  12/20/2015  CLINICAL DATA:  Bilateral lower extremity pain EXAM: BILATERAL LOWER EXTREMITY VENOUS DUPLEX ULTRASOUND TECHNIQUE: Doppler venous assessment of the bilateral lower extremity deep venous system was performed, including characterization of  spectral flow, compressibility, and phasicity.  COMPARISON:  None. FINDINGS: There is complete compressibility of the bilateral common femoral, femoral, and popliteal veins. Doppler analysis demonstrates respiratory phasicity and augmentation of flow with calf compression. No evidence of calf vein DVT. IMPRESSION: No evidence of lower extremity DVT. Electronically Signed   By: Marybelle Killings M.D.   On: 12/20/2015 16:02   Dg Chest Port 1 View  12/20/2015  CLINICAL DATA:  Central line placement. History of hypertension, chronic kidney disease. EXAM: PORTABLE CHEST 1 VIEW COMPARISON:  Chest x-rays dated 12/19/2015 and 11/29/2015. FINDINGS: Left internal jugular central line is been placed with tip well positioned at the expected level of the cavoatrial junction. No pneumothorax seen. Study is hypoinspiratory with crowding of the perihilar bronchovascular markings. Given the low lung volumes, lungs appear clear. No evidence of pneumonia. No pleural effusion seen. Cardiomediastinal silhouette is stable in size and configuration. IMPRESSION: 1. Left internal jugular central line appears well positioned with tip at the expected level of the cavoatrial junction. No pneumothorax or other procedural complicating feature seen. 2. Lungs appear clear and there is no evidence of acute cardiopulmonary abnormality. Electronically Signed   By: Franki Cabot M.D.   On: 12/20/2015 12:56   Dg Chest Port 1 View  12/19/2015  CLINICAL DATA:  Shortness of breath EXAM: PORTABLE CHEST 1 VIEW COMPARISON:  11/29/2015 chest radiograph. FINDINGS: Stable cardiomediastinal silhouette with top normal heart size. No pneumothorax. No pleural effusion. There hazy patchy parahilar opacities in both lungs. IMPRESSION: Nonspecific hazy patchy parahilar opacities in both lungs, which could represent a developing multifocal pneumonia. Consider short-term follow-up with PA and lateral chest radiographs. Electronically Signed   By: Ilona Sorrel M.D.    On: 12/19/2015 12:47     ASSESSMENT AND PLAN:    49 year old female with a history of EtOH liver cirrhosis who presents with hypovolemic shock.  1. Hypovolemic shock: This is secondary to chronic diarrhea. Patient remains on pressors. Keep map greater than 65.   2. Urinary tract infection: Continue Ancef. urine culture pending.   3. End-stage liver disease: Patient has lower extremity edema and ascites on physical examination. .  GI consult placed and recommendations were for right upper quadrant ultrasound. Ultrasound does show evidence of ascites. Continue to hold nadolol and diuretics.   Creatinine cannot be Calculated due to elevated bilirubin. She is being followed by hospice. 4. Diarrhea: diarrhea has improved. C. difficile is negative.   5. Hyponatremia: This is secondary to liver cirrhosis.  6. Thrombocytopenia: This is secondary to end-stage liver disease.  Management plans discussed with the patient and she is in agreement.  CODE STATUS: DNR  CRITICAL TOTAL TIME TAKING CARE OF THIS PATIENT: 38 minutes.     POSSIBLE D/C 3-4 days , DEPENDING ON CLINICAL CONDITION.   Ival Pacer M.D on 12/21/2015 at 10:39 AM  Between 7am to 6pm - Pager - [phone] After 6pm go to [email or web address] - password [email or web address] [facility]  [place] [facility]  Office  [phone]  CC: Primary care physician; No PCP Per Patient  Note: This dictation was prepared with Dragon dictation along with smaller phrase technology. Any transcriptional errors that result from this process are unintentional.

## 2015-12-22 DIAGNOSIS — A419 Sepsis, unspecified organism: Principal | ICD-10-CM

## 2015-12-22 LAB — CBC
HEMATOCRIT: 27.3 % — AB (ref 35.0–47.0)
Hemoglobin: 9.4 g/dL — ABNORMAL LOW (ref 12.0–16.0)
MCH: 33.5 pg (ref 26.0–34.0)
MCHC: 34.2 g/dL (ref 32.0–36.0)
MCV: 97.8 fL (ref 80.0–100.0)
Platelets: 40 10*3/uL — ABNORMAL LOW (ref 150–440)
RBC: 2.79 MIL/uL — ABNORMAL LOW (ref 3.80–5.20)
RDW: 22.1 % — AB (ref 11.5–14.5)
WBC: 10.4 10*3/uL (ref 3.6–11.0)

## 2015-12-22 LAB — GASTROINTESTINAL PANEL BY PCR, STOOL (REPLACES STOOL CULTURE)
ADENOVIRUS F40/41: NOT DETECTED
ASTROVIRUS: NOT DETECTED
CYCLOSPORA CAYETANENSIS: NOT DETECTED
Campylobacter species: NOT DETECTED
Cryptosporidium: NOT DETECTED
E. coli O157: NOT DETECTED
ENTAMOEBA HISTOLYTICA: NOT DETECTED
ENTEROPATHOGENIC E COLI (EPEC): NOT DETECTED
ENTEROTOXIGENIC E COLI (ETEC): NOT DETECTED
Enteroaggregative E coli (EAEC): NOT DETECTED
Giardia lamblia: NOT DETECTED
Norovirus GI/GII: NOT DETECTED
Plesimonas shigelloides: NOT DETECTED
Rotavirus A: NOT DETECTED
SAPOVIRUS (I, II, IV, AND V): NOT DETECTED
Salmonella species: NOT DETECTED
Shiga like toxin producing E coli (STEC): NOT DETECTED
Shigella/Enteroinvasive E coli (EIEC): NOT DETECTED
VIBRIO CHOLERAE: NOT DETECTED
VIBRIO SPECIES: NOT DETECTED
Yersinia enterocolitica: NOT DETECTED

## 2015-12-22 LAB — BASIC METABOLIC PANEL
Anion gap: 7 (ref 5–15)
BUN: 14 mg/dL (ref 6–20)
CALCIUM: 6.7 mg/dL — AB (ref 8.9–10.3)
CO2: 15 mmol/L — AB (ref 22–32)
Chloride: 112 mmol/L — ABNORMAL HIGH (ref 101–111)
GLUCOSE: 120 mg/dL — AB (ref 65–99)
POTASSIUM: 3.1 mmol/L — AB (ref 3.5–5.1)
Sodium: 134 mmol/L — ABNORMAL LOW (ref 135–145)

## 2015-12-22 MED ORDER — POTASSIUM CHLORIDE CRYS ER 20 MEQ PO TBCR
40.0000 meq | EXTENDED_RELEASE_TABLET | Freq: Two times a day (BID) | ORAL | Status: DC
  Administered 2015-12-22 – 2015-12-27 (×9): 40 meq via ORAL
  Filled 2015-12-22 (×9): qty 2

## 2015-12-22 NOTE — H&P (Signed)
PULMONOLOGY CONSULT  PATIENT NAME: Abigail Cook    MR#:  [id number]  DATE OF BIRTH:  02/04/1966  DATE OF ADMISSION:  12/19/2015  PRIMARY CARE PHYSICIAN: Hospice  REQUESTING/REFERRING PHYSICIAN: [place]  CHIEF COMPLAINT:   Chief Complaint  Patient presents with  . Shortness of Breath  weakness and could not walk  HISTORY OF PRESENT ILLNESS:  Off vasopressors Alert and awake Will keep MAP>55, NAD    DRUG ALLERGIES:  No Known Allergies  REVIEW OF SYSTEMS:  CONSTITUTIONAL: No fever, positive for fatigue.  EYES: Poor vision EARS, NOSE, AND THROAT: No tinnitus or ear pain. No sore throat RESPIRATORY: -cough and some shortness of breath, no wheezing or hemoptysis.  CARDIOVASCULAR: denies chest pain, no orthopnea, edema.  GASTROINTESTINAL: No nausea, vomiting. + diarrhea and abdominal pain. Bright red blood per rectum ALL OTHER ROS NEGATIVE  MEDICATIONS AT HOME:   Prior to Admission medications   Medication Sig Start Date End Date Taking? Authorizing Provider  albuterol (PROVENTIL HFA;VENTOLIN HFA) 108 (90 BASE) MCG/ACT inhaler Inhale 2 puffs into the lungs every 6 (six) hours as needed for wheezing or shortness of breath. 12/02/15  Yes Vaughan Basta, MD  ipratropium-albuterol (DUONEB) 0.5-2.5 (3) MG/3ML SOLN Take 1 vial by nebulization every 6 (six) hours as needed. 12/04/15  Yes Historical Provider, MD  lactulose (CHRONULAC) 10 GM/15ML solution Take 15 mLs (10 g total) by mouth 2 (two) times daily. 12/02/15  Yes Vaughan Basta, MD  Multiple Vitamin (MULTIVITAMIN WITH MINERALS) TABS tablet Take 1 tablet by mouth daily. 12/02/15  Yes Vaughan Basta, MD  mupirocin ointment (BACTROBAN) 2 % Apply 1 application topically 3 (three) times daily. To each nostril for 1 week 12/12/15 12/19/15 Yes Historical Provider, MD  nadolol (CORGARD) 20 MG tablet Take 1 tablet (20 mg total) by mouth daily. 12/02/15  Yes Vaughan Basta, MD  pantoprazole (PROTONIX) 40  MG tablet Take 1 tablet (40 mg total) by mouth daily. 12/02/15  Yes Vaughan Basta, MD  Potassium Chloride 40 MEQ/15ML (20%) SOLN Take 15 mLs by mouth daily. For one week 12/12/15 12/19/15 Yes Historical Provider, MD      VITAL SIGNS:  Blood pressure 96/54, pulse 56, temperature 96.4 F (35.8 C), temperature source Axillary, resp. rate 16, height 5\' 2"  (1.575 m), weight 220 lb (99.791 kg), SpO2 100 %.  PHYSICAL EXAMINATION:  GENERAL:  50 y.o.-year-old patient lying in the bed with no acute distress.  EYES: Pupils equal, round, reactive to light and accommodation. Positive for icterus. Extraocular muscles intact.  HEENT: Head atraumatic, normocephalic. Oropharynx and nasopharynx clear.  NECK:  Supple, no jugular venous distention. No thyroid enlargement, no tenderness.  LUNGS: Normal breath sounds bilaterally, no wheezing, rales,rhonchi or crepitation. No use of accessory muscles of respiration.  CARDIOVASCULAR: S1, S2 normal. No murmurs, rubs, or gallops.  ABDOMEN: Soft, nontender, nondistended. Bowel sounds present. No organomegaly or mass.  EXTREMITIES: 3+ pedal edema, no cyanosis, or clubbing.  NEUROLOGIC: Cranial nerves II through XII are intact. Muscle strength 5/5 in all extremities. Sensation intact. Gait not checked.    LABORATORY PANEL:   CBC  Recent Labs Lab 12/22/15 0526  WBC 10.4  HGB 9.4*  HCT 27.3*  PLT 40*   ------------------------------------------------------------------------------------------------------------------  Chemistries   Recent Labs Lab 12/21/15 0631 12/22/15 0526  NA 132* 134*  K 3.5 3.1*  CL 112* 112*  CO2 13* 15*  GLUCOSE 125* 120*  BUN 16 14  CREATININE NOT CALCULATED SEE COMMENTS  CALCIUM 6.5* 6.7*  AST 53*  --  ALT 38  --   ALKPHOS 132*  --   BILITOT 22.8*  --    ------------------------------------------------------------------------------------------------------------------  Cardiac Enzymes  Recent Labs Lab  12/19/15 1114  TROPONINI 0.03   ------------------------------------------------------------------------------------------------------------------  RADIOLOGY:  US Abdomen Limited  12/21/2015  CLINICAL DATA:  Hepatic cirrhosis, ascites. EXAM: LIMITED ABDOMEN ULTRASOUND FOR ASCITES TECHNIQUE: Limited ultrasound survey for ascites was performed in all four abdominal quadrants. COMPARISON:  Ultrasound of November 07, 2015. FINDINGS: Nodular hepatic margins are again noted suggesting hepatic cirrhosis. Mild to moderate ascites is noted in the right upper quadrant. Mild to moderate ascites is noted in the right lower quadrant. Minimal ascites is noted in the left upper and lower quadrants. IMPRESSION: Ascites is noted, most prominently seen in the right upper and lower quadrants. Electronically Signed   By: Marijo Conception, M.D.   On: 12/21/2015 08:36   US Venous Img Lower Bilateral  12/20/2015  CLINICAL DATA:  Bilateral lower extremity pain EXAM: BILATERAL LOWER EXTREMITY VENOUS DUPLEX ULTRASOUND TECHNIQUE: Doppler venous assessment of the bilateral lower extremity deep venous system was performed, including characterization of spectral flow, compressibility, and phasicity. COMPARISON:  None. FINDINGS: There is complete compressibility of the bilateral common femoral, femoral, and popliteal veins. Doppler analysis demonstrates respiratory phasicity and augmentation of flow with calf compression. No evidence of calf vein DVT. IMPRESSION: No evidence of lower extremity DVT. Electronically Signed   By: Marybelle Killings M.D.   On: 12/20/2015 16:02   Dg Chest Port 1 View  12/20/2015  CLINICAL DATA:  Central line placement. History of hypertension, chronic kidney disease. EXAM: PORTABLE CHEST 1 VIEW COMPARISON:  Chest x-rays dated 12/19/2015 and 11/29/2015. FINDINGS: Left internal jugular central line is been placed with tip well positioned at the expected level of the cavoatrial junction. No pneumothorax seen.  Study is hypoinspiratory with crowding of the perihilar bronchovascular markings. Given the low lung volumes, lungs appear clear. No evidence of pneumonia. No pleural effusion seen. Cardiomediastinal silhouette is stable in size and configuration. IMPRESSION: 1. Left internal jugular central line appears well positioned with tip at the expected level of the cavoatrial junction. No pneumothorax or other procedural complicating feature seen. 2. Lungs appear clear and there is no evidence of acute cardiopulmonary abnormality. Electronically Signed   By: Franki Cabot M.D.   On: 12/20/2015 12:56   CXR 12/29 images reviewed 12/22/2015     IMPRESSION AND PLAN:  50 yo AAF with end stage liver cirrhosis from ETOH abuse with acute worsening of chronic diarrhea leading to hypovolumic shock -resolving slowly  1.Shock-hypovolumia-resolving -continue IVF's for  Now -off vasopressors  2.UTI-cont abx now on ancef -follow up cultures  3.ESLD-recommend GI consult for med reconciliation  4.LE SWELLING - ECHO grade 1 diastolic dysfunction, nl EF  5.diarrhea with GiB c diff neg -follow up GI recs  Prognosis is very poor, patient now DNR/DNI as she has expressed this to me and to ICU nurse  OK to transfer to gen med floor once weaned off vasopressors   The Patient requires high complexity decision making for assessment and support, frequent evaluation and titration of therapies.  Corrin Parker, M.D.  [facility]  Medical Director [place] Director [facility] Cardio-Pulmonary Department

## 2015-12-22 NOTE — Progress Notes (Signed)
GI Inpatient Follow-up Note  Patient Identification: Abigail Cook is a 50 y.o. female with decomp cirrhosis c/b ascites a/w hypovolemia requirng pressors  Subjective:  No complaints today. Denies abd pain, n/v, rectal bleeding, melena, confusion.   Scheduled Inpatient Medications:  .  ceFAZolin (ANCEF) IV  1 g Intravenous Q12H  . multivitamin with minerals  1 tablet Oral Daily  . pantoprazole  40 mg Oral QAC breakfast  . potassium chloride  40 mEq Oral BID  . predniSONE  50 mg Oral Q breakfast  . sodium chloride  3 mL Intravenous Q12H    Continuous Inpatient Infusions:   . sodium chloride 100 mL/hr at 12/22/15 0634  . norepinephrine (LEVOPHED) Adult infusion 2 mcg/min (12/22/15 2035)  . phenylephrine (NEO-SYNEPHRINE) Adult infusion Stopped (12/20/15 0910)    PRN Inpatient Medications:  ipratropium-albuterol, ondansetron **OR** ondansetron (ZOFRAN) IV, oxyCODONE    Physical Examination: BP 107/68 mmHg  Pulse 64  Temp(Src) 96.2 F (35.7 C) (Axillary)  Resp 14  Ht 5\' 2"  (1.575 m)  Wt 99.791 kg (220 lb)  BMI 40.23 kg/m2  SpO2 100% Gen: NAD, alert and oriented x 3 Neck: supple, no JVD or thyromegaly Chest: CTA bilaterally, no wheezes, crackles, or other adventitious sounds CV: RRR, no m/g/c/r Abd: soft, NT, ND, +BS in all four quadrants; no HSM, guarding, ridigity, or rebound tenderness Ext: no edema, well perfused with 2+ pulses, Skin: no rash or lesions noted Lymph: no LAD Neuro: very mild asterixis  Data: Lab Results  Component Value Date   WBC 10.4 12/22/2015   HGB 9.4* 12/22/2015   HCT 27.3* 12/22/2015   MCV 97.8 12/22/2015   PLT 40* 12/22/2015    Recent Labs Lab 12/20/15 0403 12/21/15 0631 12/22/15 0526  HGB 9.7* 9.7* 9.4*   Lab Results  Component Value Date   NA 134* 12/22/2015   K 3.1* 12/22/2015   CL 112* 12/22/2015   CO2 15* 12/22/2015   BUN 14 12/22/2015   CREATININE SEE COMMENTS 12/22/2015   Lab Results  Component Value Date   ALT 38 12/21/2015   AST 53* 12/21/2015   ALKPHOS 132* 12/21/2015   BILITOT 22.8* 12/21/2015    Recent Labs Lab 12/19/15 1114  APTT 43*  INR 1.78   Assessment/Plan: Ms. Gierke is a 50 y.o. female with dcompensated ETOH cirrhosis c/b asites, a/w hypovolemia requring pressors and diarrhea.  Diarrhea now resolved off lactulose.  Weaning off pressors. Does have ascites on u/s which is new.   Recommendations: - diagnositic only para to r/o SBP - hold lactulose for now but may nee to start low dose since she has mild asterixis on exam - low na diet - diuretics when tolerates for the ascites, likely not until outpt if BP will tolerate at all.  - hold nadolol - cont ETOH avoidance - high risk for mortality  Please call with questions or concerns.  REIN, Grace Blight, MD

## 2015-12-22 NOTE — Progress Notes (Signed)
[place] at [place] NAME: Abigail Cook    MR#:  [id number]  DATE OF BIRTH:  1966-01-07  SUBJECTIVE:   Patient is still on vasopressors. She denies dizziness, chest pain or shortness of breath. She is tolerating her diet and has no issues other than lower extremely edema.  REVIEW OF SYSTEMS:    Review of Systems  Constitutional: Negative for fever, chills and malaise/fatigue.  HENT: Negative for sore throat.   Eyes: Negative for blurred vision.  Respiratory: Negative for cough, hemoptysis, shortness of breath and wheezing.   Cardiovascular: Positive for leg swelling. Negative for chest pain and palpitations.  Gastrointestinal: Negative for nausea, vomiting, abdominal pain, diarrhea and blood in stool.  Genitourinary: Negative for dysuria.  Musculoskeletal: Negative for back pain.  Neurological: Negative for dizziness, tremors and headaches.  Endo/Heme/Allergies: Does not bruise/bleed easily.    Tolerating Diet: Yes      DRUG ALLERGIES:  No Known Allergies  VITALS:  Blood pressure 88/51, pulse 55, temperature 96.4 F (35.8 C), temperature source Axillary, resp. rate 16, height 5\' 2"  (1.575 m), weight 99.791 kg (220 lb), SpO2 100 %.  PHYSICAL EXAMINATION:   Physical Exam  Constitutional: She is oriented to person, place, and time and well-developed, well-nourished, and in no distress. No distress.  HENT:  Head: Normocephalic.  Eyes: Scleral icterus is present.  Neck: Normal range of motion. Neck supple. No JVD present. No tracheal deviation present.  Cardiovascular: Normal rate, regular rhythm and normal heart sounds.  Exam reveals no gallop and no friction rub.   No murmur heard. Pulmonary/Chest: Effort normal and breath sounds normal. No respiratory distress. She has no wheezes. She has no rales. She exhibits no tenderness.  Abdominal: Soft. Bowel sounds are normal. She exhibits distension. She exhibits no mass.  There is no tenderness. There is no rebound and no guarding.  Musculoskeletal: Normal range of motion. She exhibits edema.  Neurological: She is alert and oriented to person, place, and time.  Skin: Skin is warm. No rash noted. No erythema.  Jaundice  Psychiatric: Affect and judgment normal.      LABORATORY PANEL:   CBC  Recent Labs Lab 12/22/15 0526  WBC 10.4  HGB 9.4*  HCT 27.3*  PLT 40*   ------------------------------------------------------------------------------------------------------------------  Chemistries   Recent Labs Lab 12/21/15 0631 12/22/15 0526  NA 132* 134*  K 3.5 3.1*  CL 112* 112*  CO2 13* 15*  GLUCOSE 125* 120*  BUN 16 14  CREATININE NOT CALCULATED SEE COMMENTS  CALCIUM 6.5* 6.7*  AST 53*  --   ALT 38  --   ALKPHOS 132*  --   BILITOT 22.8*  --    ------------------------------------------------------------------------------------------------------------------  Cardiac Enzymes  Recent Labs Lab 12/19/15 1114  TROPONINI 0.03   ------------------------------------------------------------------------------------------------------------------  RADIOLOGY:  US Abdomen Limited  12/21/2015  CLINICAL DATA:  Hepatic cirrhosis, ascites. EXAM: LIMITED ABDOMEN ULTRASOUND FOR ASCITES TECHNIQUE: Limited ultrasound survey for ascites was performed in all four abdominal quadrants. COMPARISON:  Ultrasound of November 07, 2015. FINDINGS: Nodular hepatic margins are again noted suggesting hepatic cirrhosis. Mild to moderate ascites is noted in the right upper quadrant. Mild to moderate ascites is noted in the right lower quadrant. Minimal ascites is noted in the left upper and lower quadrants. IMPRESSION: Ascites is noted, most prominently seen in the right upper and lower quadrants. Electronically Signed   By: Marijo Conception, M.D.   On: 12/21/2015 08:36   US Venous Img  Lower Bilateral  12/20/2015  CLINICAL DATA:  Bilateral lower extremity pain EXAM:  BILATERAL LOWER EXTREMITY VENOUS DUPLEX ULTRASOUND TECHNIQUE: Doppler venous assessment of the bilateral lower extremity deep venous system was performed, including characterization of spectral flow, compressibility, and phasicity. COMPARISON:  None. FINDINGS: There is complete compressibility of the bilateral common femoral, femoral, and popliteal veins. Doppler analysis demonstrates respiratory phasicity and augmentation of flow with calf compression. No evidence of calf vein DVT. IMPRESSION: No evidence of lower extremity DVT. Electronically Signed   By: Marybelle Killings M.D.   On: 12/20/2015 16:02   Dg Chest Port 1 View  12/20/2015  CLINICAL DATA:  Central line placement. History of hypertension, chronic kidney disease. EXAM: PORTABLE CHEST 1 VIEW COMPARISON:  Chest x-rays dated 12/19/2015 and 11/29/2015. FINDINGS: Left internal jugular central line is been placed with tip well positioned at the expected level of the cavoatrial junction. No pneumothorax seen. Study is hypoinspiratory with crowding of the perihilar bronchovascular markings. Given the low lung volumes, lungs appear clear. No evidence of pneumonia. No pleural effusion seen. Cardiomediastinal silhouette is stable in size and configuration. IMPRESSION: 1. Left internal jugular central line appears well positioned with tip at the expected level of the cavoatrial junction. No pneumothorax or other procedural complicating feature seen. 2. Lungs appear clear and there is no evidence of acute cardiopulmonary abnormality. Electronically Signed   By: Franki Cabot M.D.   On: 12/20/2015 12:56     ASSESSMENT AND PLAN:    50 year old female with a history of EtOH liver cirrhosis who presents with hypovolemic shock.  1. Hypovolemic shock: This is secondary to chronic diarrhea. Patient remains on pressors. Keep map greater than 60.  . 2. Urinary tract infection: Continue Ancef. urine culture suggests 3 collection.   3. End-stage liver disease:  Patient has lower extremity edema and ascites on physical examination. .  GI consult placed and recommendations were for right upper quadrant ultrasound. Ultrasound does show evidence of ascites. GI is now recommending paracentesis to evaluate for SBP.  Continue to hold nadolol and diuretics.   Creatinine cannot be Calculated due to elevated bilirubin. She is being followed by hospice. 4. Diarrhea: diarrhea has improved. C. difficile is negative as well as GI panel..   5. Hyponatremia: This is secondary to liver cirrhosis.  6. Thrombocytopenia: This is secondary to end-stage liver disease.  Management plans discussed with the patient and she is in agreement.  CODE STATUS: DNR  CRITICAL TOTAL TIME TAKING CARE OF THIS PATIENT: 24 minutes.   patient remains critically ill eyes she still remains on vasopressors for hypovolemic shock.    POSSIBLE D/C 3-4 days , DEPENDING ON CLINICAL CONDITION.   Alice Burnside M.D on 12/22/2015 at 9:50 AM  Between 7am to 6pm - Pager - [phone] After 6pm go to [email or web address] - password [email or web address] [facility]  [place] [facility]  Office  [phone]  CC: Primary care physician; No PCP Per Patient  Note: This dictation was prepared with Dragon dictation along with smaller phrase technology. Any transcriptional errors that result from this process are unintentional.

## 2015-12-23 MED ORDER — LACTULOSE 10 GM/15ML PO SOLN
10.0000 g | Freq: Every day | ORAL | Status: DC
  Administered 2015-12-23 – 2015-12-25 (×3): 10 g via ORAL
  Filled 2015-12-23 (×3): qty 30

## 2015-12-23 MED ORDER — CEFAZOLIN SODIUM 1-5 GM-% IV SOLN
1.0000 g | Freq: Every day | INTRAVENOUS | Status: DC
  Administered 2015-12-24 – 2015-12-25 (×2): 1 g via INTRAVENOUS
  Filled 2015-12-23 (×3): qty 50

## 2015-12-23 NOTE — Progress Notes (Signed)
GI Inpatient Follow-up Note  Patient Identification: Abigail Cook is a 50 y.o. female with decomp cirrhosis, hypovolemia requriing pressors, diarrhea  Subjective:  No more diarrhea. One stool this am, formed. Denies abd pain, n/v, rectal bleeding, melena. Weaned off pressors.   Scheduled Inpatient Medications:  . [START ON 12/24/2015]  ceFAZolin (ANCEF) IV  1 g Intravenous Daily  . lactulose  10 g Oral Daily  . multivitamin with minerals  1 tablet Oral Daily  . pantoprazole  40 mg Oral QAC breakfast  . potassium chloride  40 mEq Oral BID  . predniSONE  50 mg Oral Q breakfast  . sodium chloride  3 mL Intravenous Q12H    Continuous Inpatient Infusions:   . sodium chloride 100 mL/hr at 12/23/15 1900  . norepinephrine (LEVOPHED) Adult infusion Stopped (12/23/15 1042)  . phenylephrine (NEO-SYNEPHRINE) Adult infusion Stopped (12/20/15 0910)    PRN Inpatient Medications:  ipratropium-albuterol, ondansetron **OR** ondansetron (ZOFRAN) IV, oxyCODONE  Review of Systems: Constitutional: Weight is stable.  Eyes: No changes in vision. ENT: No oral lesions, sore throat.  GI: see HPI.  Heme/Lymph: No easy bruising.  CV: No chest pain.  GU: No hematuria.  Integumentary: No rashes.  Neuro: No headaches.  Psych: No depression/anxiety.  Endocrine: No heat/cold intolerance.  Allergic/Immunologic: No urticaria.  Resp: No cough, SOB.  Musculoskeletal: No joint swelling.    Physical Examination: BP 95/52 mmHg  Pulse 61  Temp(Src) 97.9 F (36.6 C) (Axillary)  Resp 16  Ht 5\' 2"  (1.575 m)  Wt 99.791 kg (220 lb)  BMI 40.23 kg/m2  SpO2 100% Gen: NAD, alert and oriented x 3, + appears chronically il,  + jaundice  Neck: supple, no JVD or thyromegaly Chest: CTA bilaterally, no wheezes, crackles, or other adventitious sounds CV: RRR, no m/g/c/r Abd: NT, ND, +BS in all four quadrants; no HSM, guarding, ridigity, or rebound tenderness Ext: no edema, well perfused with 2+ pulses, Skin:  no rash or lesions noted Lymph: no LAD Neuro: + mild asterixis  Data: Lab Results  Component Value Date   WBC 10.4 12/22/2015   HGB 9.4* 12/22/2015   HCT 27.3* 12/22/2015   MCV 97.8 12/22/2015   PLT 40* 12/22/2015    Recent Labs Lab 12/20/15 0403 12/21/15 0631 12/22/15 0526  HGB 9.7* 9.7* 9.4*   Lab Results  Component Value Date   NA 134* 12/22/2015   K 3.1* 12/22/2015   CL 112* 12/22/2015   CO2 15* 12/22/2015   BUN 14 12/22/2015   CREATININE SEE COMMENTS 12/22/2015   Lab Results  Component Value Date   ALT 38 12/21/2015   AST 53* 12/21/2015   ALKPHOS 132* 12/21/2015   BILITOT 22.8* 12/21/2015    Recent Labs Lab 12/19/15 1114  APTT 43*  INR 1.78   Assessment/Plan: Ms. Rathmann is a 50 y.o. female with decomp ETOH cirrhosis c/b ascictes, diarrhea which is now resolved off lactulose, now off pressors.   Recommendations: - restart lactulose 15 ml daily since she is developing asterixis.  - diagnostic only para - low Na diet - start low dose spiro and lasix if / once BP tolerates, likely won't tolerate since she just required pressors - titrate lactulose to 2 stools per day.   Please call with questions or concerns.  Shadrach Bartunek, Grace Blight, MD

## 2015-12-23 NOTE — Progress Notes (Signed)
Paged Dr. Benjie Karvonen in regards to patient off vasopressors since 1047 AM. Orders received to move patient out of unit to any med-surg no telemetry.

## 2015-12-23 NOTE — Progress Notes (Signed)
[place] at [place] NAME: Abigail Cook    MR#:  [id number]  DATE OF BIRTH:  [birth year]  SUBJECTIVE:   Patient is still on vasopressors but completely symptomatic. Patient continues to have lower extremity edema. Patient denies chest pain or shortness of breath.  REVIEW OF SYSTEMS:    Review of Systems  Constitutional: Negative for fever, chills and malaise/fatigue.  HENT: Negative for sore throat.   Eyes: Negative for blurred vision.  Respiratory: Negative for cough, hemoptysis, shortness of breath and wheezing.   Cardiovascular: Positive for leg swelling. Negative for chest pain and palpitations.  Gastrointestinal: Negative for nausea, vomiting, abdominal pain, diarrhea and blood in stool.  Genitourinary: Negative for dysuria.  Musculoskeletal: Negative for back pain.  Neurological: Negative for dizziness, tremors and headaches.  Endo/Heme/Allergies: Does not bruise/bleed easily.    Tolerating Diet: Yes      DRUG ALLERGIES:  No Known Allergies  VITALS:  Blood pressure 94/55, pulse 65, temperature 97.4 F (36.3 C), temperature source Axillary, resp. rate 15, height 5\' 2"  (1.575 m), weight 99.791 kg (220 lb), SpO2 100 %.  PHYSICAL EXAMINATION:   Physical Exam  Constitutional: She is oriented to person, place, and time and well-developed, well-nourished, and in no distress. No distress.  HENT:  Head: Normocephalic.  Eyes: Scleral icterus is present.  Neck: Normal range of motion. Neck supple. No JVD present. No tracheal deviation present.  Cardiovascular: Normal rate, regular rhythm and normal heart sounds.  Exam reveals no gallop and no friction rub.   No murmur heard. Pulmonary/Chest: Effort normal and breath sounds normal. No respiratory distress. She has no wheezes. She has no rales. She exhibits no tenderness.  Abdominal: Soft. Bowel sounds are normal. She exhibits distension. She exhibits no mass. There is no  tenderness. There is no rebound and no guarding.  Musculoskeletal: Normal range of motion. She exhibits edema.  Neurological: She is alert and oriented to person, place, and time.  Skin: Skin is warm. No rash noted. No erythema.  Psychiatric: Affect and judgment normal.      LABORATORY PANEL:   CBC  Recent Labs Lab 12/22/15 0526  WBC 10.4  HGB 9.4*  HCT 27.3*  PLT 40*   ------------------------------------------------------------------------------------------------------------------  Chemistries   Recent Labs Lab 12/21/15 0631 12/22/15 0526  NA 132* 134*  K 3.5 3.1*  CL 112* 112*  CO2 13* 15*  GLUCOSE 125* 120*  BUN 16 14  CREATININE NOT CALCULATED SEE COMMENTS  CALCIUM 6.5* 6.7*  AST 53*  --   ALT 38  --   ALKPHOS 132*  --   BILITOT 22.8*  --    ------------------------------------------------------------------------------------------------------------------  Cardiac Enzymes  Recent Labs Lab 12/19/15 1114  TROPONINI 0.03   ------------------------------------------------------------------------------------------------------------------  RADIOLOGY:  No results found.   ASSESSMENT AND PLAN:    50 year old female with a history of EtOH liver cirrhosis who presents with hypovolemic shock.  1. Hypovolemic shock: This is secondary to chronic diarrhea. Patient remains on pressors. Keep map greater than 55 as per Dr.KASA. Continue to try to wean vasopressors.  . 2. Urinary tract infection: Continue Ancef. urine culture suggests recollection. Ancef in be discontinued tomorrow.  3. End-stage liver disease: Patient has lower extremity edema and ascites on physical examination. .  GI consult placed and recommendations were for right upper quadrant ultrasound. Ultrasound does show evidence of ascites. GI is now recommending paracentesis to evaluate for SBP. Paracentesis is pending  Continue to hold nadolol and  diuretics.  Her lower extremity edema is likely due  to a combination of liver cirrhosis and third spacing. Patient will eventually need to be restarted on Lasix due to her cirrhosis and may benefit from middle drain if her blood pressure continues to be low.  Creatinine cannot be Calculated due to elevated bilirubin. She is being followed by hospice. 4. Diarrhea: diarrhea has improved. C. difficile is negative as well as stool GI panel.  5. Hyponatremia: This is secondary to liver cirrhosis.  6. Thrombocytopenia: This is secondary to end-stage liver disease.  Management plans discussed with the patient and she is in agreement.  CODE STATUS: DNR  CRITICAL TOTAL TIME TAKING CARE OF THIS PATIENT: 51 minutes   patient remains critically ill  she still remains on vasopressors for hypovolemic shock.   Once patient is off of pressors consider physical therapy consultation. POSSIBLE D/C 3-4 days , DEPENDING ON CLINICAL CONDITION.   Earleen Aoun M.D on 12/23/2015 at 11:07 AM  Between 7am to 6pm - Pager - [phone] After 6pm go to [email or web address] - password [email or web address] [facility]  [place] [facility]  Office  [phone]  CC: Primary care physician; No PCP Per Patient  Note: This dictation was prepared with Dragon dictation along with smaller phrase technology. Any transcriptional errors that result from this process are unintentional.

## 2015-12-23 NOTE — Progress Notes (Signed)
Pharmacy Antibiotic Follow-up Note  Abigail Cook is a 50 y.o. year-old female admitted on 12/19/2015.  The patient is currently on day 4 of cefazolin for UTI.  Assessment/Plan: After discussion with Dr. Mortimer Fries, will d/c cefazolin after 7 total days of therapy. SCr cannot be calculted due to elevated bilirubin. Will adjust cefazolin dosing to 1 g iv daily due to h/o CKD.   Temp (24hrs), Avg:97.3 F (36.3 C), Min:96.2 F (35.7 C), Max:98 F (36.7 C)   Recent Labs Lab 12/19/15 1114 12/20/15 0403 12/21/15 0631 12/22/15 0526  WBC 7.8 9.1 10.9 10.4    Recent Labs Lab 12/19/15 1114 12/20/15 0403 12/21/15 0631 12/22/15 0526  CREATININE UNABLE TO REPORT DUE TO ICTERUS UNABLE TO REPORT DUE TO ICTERUS NOT CALCULATED SEE COMMENTS   CrCl cannot be calculated (Patient has no serum creatinine result on file.).    No Known Allergies  Antimicrobials this admission: cefepime 12/29 >> 12/29 levofloxacin 12/29 >> 12/29 linezolid 12/29 >> 12/29 Vancomycin 12/29 >> 12/29 Cefazolin 12/30 >>  Microbiology results: 12/29 BCx: NGTD x 2 12/29 UCx: multiple species, recollect   12/29 MRSA PCR: pending ? 01/01 GI panel: negative 12/30 C difficile: negative   Thank you for allowing pharmacy to be a part of this patient's care.  Ulice Dash D PharmD 12/23/2015 11:27 AM

## 2015-12-23 NOTE — Care Management (Signed)
Patient is being weaned off pressors.  Has still had some blood pressures [id number] systolic

## 2015-12-23 NOTE — Progress Notes (Signed)
Received Called from case manager at [employer]. Gave update stating pt is progressing and will move out of the until to a regular room once bed is available.

## 2015-12-24 ENCOUNTER — Inpatient Hospital Stay

## 2015-12-24 LAB — COMPREHENSIVE METABOLIC PANEL
ALBUMIN: 1.5 g/dL — AB (ref 3.5–5.0)
ALT: 36 U/L (ref 14–54)
AST: 96 U/L — AB (ref 15–41)
Alkaline Phosphatase: 203 U/L — ABNORMAL HIGH (ref 38–126)
Anion gap: 3 — ABNORMAL LOW (ref 5–15)
BUN: 14 mg/dL (ref 6–20)
CHLORIDE: 114 mmol/L — AB (ref 101–111)
CO2: 16 mmol/L — ABNORMAL LOW (ref 22–32)
Calcium: 7.1 mg/dL — ABNORMAL LOW (ref 8.9–10.3)
Creatinine, Ser: UNDETERMINED mg/dL (ref 0.44–1.00)
Glucose, Bld: 98 mg/dL (ref 65–99)
POTASSIUM: 4 mmol/L (ref 3.5–5.1)
SODIUM: 133 mmol/L — AB (ref 135–145)
Total Bilirubin: 22.5 mg/dL (ref 0.3–1.2)
Total Protein: 4.6 g/dL — ABNORMAL LOW (ref 6.5–8.1)

## 2015-12-24 LAB — CULTURE, BLOOD (ROUTINE X 2)
CULTURE: NO GROWTH
Culture: NO GROWTH

## 2015-12-24 LAB — ALBUMIN, FLUID (OTHER)

## 2015-12-24 LAB — BASIC METABOLIC PANEL
Anion gap: 3 — ABNORMAL LOW (ref 5–15)
BUN: 15 mg/dL (ref 6–20)
CALCIUM: 7.1 mg/dL — AB (ref 8.9–10.3)
CO2: 16 mmol/L — ABNORMAL LOW (ref 22–32)
CREATININE: UNDETERMINED mg/dL (ref 0.44–1.00)
Chloride: 115 mmol/L — ABNORMAL HIGH (ref 101–111)
Glucose, Bld: 102 mg/dL — ABNORMAL HIGH (ref 65–99)
Potassium: 3.9 mmol/L (ref 3.5–5.1)
SODIUM: 134 mmol/L — AB (ref 135–145)

## 2015-12-24 LAB — BODY FLUID CELL COUNT WITH DIFFERENTIAL
Eos, Fluid: 0 %
Lymphs, Fluid: 33 %
Monocyte-Macrophage-Serous Fluid: 59 %
Neutrophil Count, Fluid: 8 %
Other Cells, Fluid: 0 %
Total Nucleated Cell Count, Fluid: 72 cu mm

## 2015-12-24 LAB — CBC
HCT: 28.4 % — ABNORMAL LOW (ref 35.0–47.0)
Hemoglobin: 9.6 g/dL — ABNORMAL LOW (ref 12.0–16.0)
MCH: 33.4 pg (ref 26.0–34.0)
MCHC: 33.9 g/dL (ref 32.0–36.0)
MCV: 98.7 fL (ref 80.0–100.0)
PLATELETS: 49 10*3/uL — AB (ref 150–440)
RBC: 2.88 MIL/uL — ABNORMAL LOW (ref 3.80–5.20)
RDW: 22.3 % — AB (ref 11.5–14.5)
WBC: 10.9 10*3/uL (ref 3.6–11.0)

## 2015-12-24 MED ORDER — PREDNISONE 20 MG PO TABS
40.0000 mg | ORAL_TABLET | Freq: Every day | ORAL | Status: DC
  Administered 2015-12-25 – 2015-12-26 (×2): 40 mg via ORAL
  Filled 2015-12-24 (×2): qty 2

## 2015-12-24 MED ORDER — CALCIUM CARBONATE ANTACID 500 MG PO CHEW
1.0000 | CHEWABLE_TABLET | Freq: Three times a day (TID) | ORAL | Status: DC | PRN
  Administered 2015-12-24: 200 mg via ORAL
  Filled 2015-12-24: qty 1

## 2015-12-24 NOTE — Plan of Care (Signed)
Problem: Safety: Goal: Ability to remain free from injury will improve Outcome: Progressing No injury.  Calls for assist. Turns and repos  Self. Enc to  Stay off  back  Problem: Pain Managment: Goal: General experience of comfort will improve Outcome: Progressing No pain  meds needed  Problem: Skin Integrity: Goal: Risk for impaired skin integrity will decrease Outcome: Not Progressing Cont to have 4 plus edema  Lower ext.

## 2015-12-24 NOTE — Progress Notes (Signed)
GI Inpatient Follow-up Note  Patient Identification: Abigail Cook is a 50 y.o. female with decomp ETOH cirrhosis c/b ascites, H.E.  Subjective:  No comlaints today.  Denies abd pain, n/v, rectal bleeding, melena.  Reports one stool today.   Para done, does not have SBP.   Scheduled Inpatient Medications:  .  ceFAZolin (ANCEF) IV  1 g Intravenous Daily  . lactulose  10 g Oral Daily  . multivitamin with minerals  1 tablet Oral Daily  . pantoprazole  40 mg Oral QAC breakfast  . potassium chloride  40 mEq Oral BID  . [START ON 12/25/2015] predniSONE  40 mg Oral Q breakfast  . sodium chloride  3 mL Intravenous Q12H    Continuous Inpatient Infusions:   . sodium chloride 100 mL/hr at 12/24/15 0200    PRN Inpatient Medications:  calcium carbonate, ipratropium-albuterol, ondansetron **OR** ondansetron (ZOFRAN) IV, oxyCODONE  Review of Systems: Constitutional: Weight is stable.  Eyes: No changes in vision. ENT: No oral lesions, sore throat.  GI: see HPI.  Heme/Lymph: +easy bruising.  CV: No chest pain.  GU: No hematuria.  Integumentary: No rashes.  Neuro: No headaches.  Psych: +depression/anxiety.  Endocrine: No heat/cold intolerance.  Allergic/Immunologic: No urticaria.  Resp: No cough, SOB.  Musculoskeletal: No joint swelling.    Physical Examination: BP 103/56 mmHg  Pulse 61  Temp(Src) 97.8 F (36.6 C) (Oral)  Resp 20  Ht 5\' 2"  (1.575 m)  Wt 99.791 kg (220 lb)  BMI 40.23 kg/m2  SpO2 100% Gen: NAD, alert and oriented x 3, + scleral icterus Neck: supple, no JVD or thyromegaly Chest: CTA bilaterally, no wheezes, crackles, or other adventitious sounds CV: RRR, no m/g/c/r Abd: soft, NT, ND, +BS in all four quadrants; no HSM, guarding, ridigity, or rebound tenderness, + obese abd Ext: no edema, well perfused with 2+ pulses, Skin: no rash or lesions noted Lymph: no LAD  Data: Lab Results  Component Value Date   WBC 10.9 12/24/2015   HGB 9.6* 12/24/2015   HCT  28.4* 12/24/2015   MCV 98.7 12/24/2015   PLT 49* 12/24/2015    Recent Labs Lab 12/21/15 0631 12/22/15 0526 12/24/15 0506  HGB 9.7* 9.4* 9.6*   Lab Results  Component Value Date   NA 134* 12/24/2015   NA 133* 12/24/2015   K 3.9 12/24/2015   K 4.0 12/24/2015   CL 115* 12/24/2015   CL 114* 12/24/2015   CO2 16* 12/24/2015   CO2 16* 12/24/2015   BUN 15 12/24/2015   BUN 14 12/24/2015   CREATININE UNABLE TO REPORT DUE TO ICTERUS 12/24/2015   CREATININE UNABLE TO REPORT DUE TO ICTERIC INTERFERENCE 12/24/2015   Lab Results  Component Value Date   ALT 36 12/24/2015   AST 96* 12/24/2015   ALKPHOS 203* 12/24/2015   BILITOT 22.5* 12/24/2015    Recent Labs Lab 12/19/15 1114  APTT 43*  INR 1.78   Assessment/Plan: Abigail Cook is a 50 y.o. female  With decomp ETOH cirrhosis c/b ascites, H.E.  + Asterixis today, mild  Recommendations: - taper off prednisone since not helping, decreased to 40 mg today.   40 x 4 days, 30 x 4 days, 20 x 4 days, 10 x 4 days, 5 x 4 days - need to increase lactulose to obtain 2 stools per day, she has mild asterixis on exam - low na diet - start diuretics if / when able, low dose.   Please call with questions or concerns.  Marjo Grosvenor, [place],  MD

## 2015-12-24 NOTE — Procedures (Signed)
Procedure and risks discussed with patient and informed consent obtained. Under US guidance, paracentesis was performed.

## 2015-12-24 NOTE — Progress Notes (Signed)
Dr Rayann Heman notified of  pts elevated total bilirubin level.  He said she is always that. No new orders

## 2015-12-24 NOTE — Progress Notes (Addendum)
Lab called and reported a critical  Total bilirulin level  Of 22.5 per susan in lab. Dr Margaretmary Eddy notified of results.she said to call GI  And let them  know

## 2015-12-24 NOTE — Progress Notes (Signed)
[facility] [place] NAME: Abigail Cook    MR#:  [id number]  DATE OF BIRTH:  [birth year]  SUBJECTIVE:   Patient is off vasopressors, bp is soft, had paracentesis today  12/24/2015. Tolerated procedure well.  Patient continues to have lower extremity edema.  REVIEW OF SYSTEMS:    Review of Systems  Constitutional: Negative for fever, chills and malaise/fatigue.  HENT: Negative for sore throat.   Eyes: Negative for blurred vision.  Respiratory: Negative for cough, hemoptysis, shortness of breath and wheezing.   Cardiovascular: Positive for leg swelling. Negative for chest pain and palpitations.  Gastrointestinal: Negative for nausea, vomiting, abdominal pain, diarrhea and blood in stool.  Genitourinary: Negative for dysuria.  Musculoskeletal: Negative for back pain.  Neurological: Negative for dizziness, tremors and headaches.  Endo/Heme/Allergies: Does not bruise/bleed easily.    Tolerating Diet: Yes      DRUG ALLERGIES:  No Known Allergies  VITALS:  Blood pressure 111/57, pulse 61, temperature 97.8 F (36.6 C), temperature source Oral, resp. rate 18, height 5\' 2"  (1.575 m), weight 99.791 kg (220 lb), SpO2 98 %.  PHYSICAL EXAMINATION:   Physical Exam  Constitutional: She is oriented to person, place, and time and well-developed, well-nourished, and in no distress. No distress.  HENT:  Head: Normocephalic.  Eyes: Scleral icterus is present.  Neck: Normal range of motion. Neck supple. No JVD present. No tracheal deviation present.  Cardiovascular: Normal rate, regular rhythm and normal heart sounds.  Exam reveals no gallop and no friction rub.   No murmur heard. Pulmonary/Chest: Effort normal and breath sounds normal. No respiratory distress. She has no wheezes. She has no rales. She exhibits no tenderness.  Abdominal: Soft. Bowel sounds are normal. She exhibits distension. She exhibits no mass. There is no tenderness.  There is no rebound and no guarding.  Musculoskeletal: Normal range of motion. She exhibits edema.  Neurological: She is alert and oriented to person, place, and time.  Skin: Skin is warm. No rash noted. No erythema.  Psychiatric: Affect and judgment normal.      LABORATORY PANEL:   CBC  Recent Labs Lab 12/24/15 0506  WBC 10.9  HGB 9.6*  HCT 28.4*  PLT 49*   ------------------------------------------------------------------------------------------------------------------  Chemistries   Recent Labs Lab 12/24/15 0506  NA 133*  134*  K 4.0  3.9  CL 114*  115*  CO2 16*  16*  GLUCOSE 98  102*  BUN 14  15  CREATININE UNABLE TO REPORT DUE TO ICTERIC INTERFERENCE  UNABLE TO REPORT DUE TO ICTERUS  CALCIUM 7.1*  7.1*  AST 96*  ALT 36  ALKPHOS 203*  BILITOT 22.5*   ------------------------------------------------------------------------------------------------------------------  Cardiac Enzymes  Recent Labs Lab 12/19/15 1114  TROPONINI 0.03   ------------------------------------------------------------------------------------------------------------------  RADIOLOGY:  US Paracentesis  12/24/2015  CLINICAL DATA:  Ascites. EXAM: ULTRASOUND GUIDED PARACENTESIS COMPARISON:  None. PROCEDURE: An ultrasound guided paracentesis was thoroughly discussed with the patient and questions answered. The benefits, risks, alternatives and complications were also discussed. The patient understands and wishes to proceed with the procedure. Written consent was obtained. Ultrasound was performed to localize and mark an adequate pocket of fluid in the right lower quadrant of the abdomen. The area was then prepped and draped in the normal sterile fashion. 1% Lidocaine was used for local anesthesia. Under ultrasound guidance a Safe-T-Centesis catheter was introduced. Paracentesis was performed. The catheter was removed and a dressing applied. COMPLICATIONS: None immediate. FINDINGS: A total  of  approximately 1.7 L of serous fluid was removed. A fluid sample was sent for laboratory analysis. IMPRESSION: Successful ultrasound guided paracentesis yielding 1.7 L of ascites. Electronically Signed   By: Marijo Conception, M.D.   On: 12/24/2015 13:54     ASSESSMENT AND PLAN:    50 year old female with a history of EtOH liver cirrhosis who presents with hypovolemic shock.  1. Hypovolemic shock: resolved. This is secondary to chronic diarrhea. Resolved, off pressors  . 2. Urinary tract infection: Continue Ancef. urine culture with multiple specimens, suggests recollection. Ancef to be discontinued today.  3. End-stage liver disease: Patient has lower extremity edema and ascites on physical examination. .  GI consult placed and recommendations were for right upper quadrant ultrasound. Ultrasound does show evidence of ascites. Pt had  paracentesis to evaluate for SBP.    Continue to hold nadolol and diuretics.  Her lower extremity edema is likely due to a combination of liver cirrhosis and third spacing. Patient will eventually need to be restarted on Lasix and spironolactone due to her cirrhosis   Creatinine cannot be Calculated due to elevated bilirubin. She is being followed by hospice.  4. Diarrhea: diarrhea has improved. C. difficile is negative as well as stool GI panel.  5. Hyponatremia: This is secondary to liver cirrhosis.  6. Thrombocytopenia: This is secondary to end-stage liver disease.  Management plans discussed with the patient and she is in agreement.  CODE STATUS: DNR  TOTAL TIME TAKING CARE OF THIS PATIENT: 47  Minutes .   Once patient is off of pressors consider physical therapy consultation. POSSIBLE D/C 3-4 days , DEPENDING ON CLINICAL CONDITION.   [name] M.D on 12/24/2015 at 6:03 PM  Between 7am to 6pm - Pager - [phone] After 6pm go to [email or web address] - password [email or web address] [facility]  Office  [phone]  CC: Primary care  physician; Nino Glow McLean-Scocozza, MD  Note: This dictation was prepared with Dragon dictation along with smaller phrase technology. Any transcriptional errors that result from this process are unintentional.

## 2015-12-24 NOTE — Progress Notes (Signed)
Visit made. Patient seen lying in bed, having just returned from having a paracentesis. Per chart note review 1.7 liters were drawn off. She was transferred from ICU this morning as she had been off vasopressors since yesterday morning. Patient had eaten a few bites of lunch and was requesting coffee. She appeared a bit drowsy, denied pain or discomfort. [facility] peer Kennyth Lose present during visit. Kennyth Lose related that Meosha has an appointment with her PCP Dr. Linus Orn McLean-Scocuzza at [facility] on 1/5 and was wondering if she would be discharged in time. Of note there is no PCP listed for patient. Writer faxed the patient's H+P to Dr. Terese Door and gave the physician contact information to the unit secretary to add to the patient's record. Also of note patient was made a DNR while in the ICU no signed DNR in place on chart. CMRN Hassan Rowan made aware. She remains on IV fluid and IV antibiotics, oral steroids and lactulose. GI consult completed and have recommended restarting lasix and spironolactone depending on blood pressure which is running low 80-low 100's over 30's-50's. No discharge plan at this time. Will continue to follow and update hospice team.  Flo Shanks RN, BSN, [facility] liaison [phone] c

## 2015-12-25 LAB — COMPREHENSIVE METABOLIC PANEL
ALBUMIN: 1.4 g/dL — AB (ref 3.5–5.0)
ALT: 31 U/L (ref 14–54)
AST: 81 U/L — AB (ref 15–41)
Alkaline Phosphatase: 201 U/L — ABNORMAL HIGH (ref 38–126)
Anion gap: 4 — ABNORMAL LOW (ref 5–15)
BUN: 15 mg/dL (ref 6–20)
CHLORIDE: 117 mmol/L — AB (ref 101–111)
CO2: 15 mmol/L — AB (ref 22–32)
Calcium: 7.4 mg/dL — ABNORMAL LOW (ref 8.9–10.3)
GLUCOSE: 111 mg/dL — AB (ref 65–99)
POTASSIUM: 4.4 mmol/L (ref 3.5–5.1)
SODIUM: 136 mmol/L (ref 135–145)
Total Bilirubin: 22.8 mg/dL (ref 0.3–1.2)
Total Protein: 4.5 g/dL — ABNORMAL LOW (ref 6.5–8.1)

## 2015-12-25 LAB — CBC
HCT: 27.8 % — ABNORMAL LOW (ref 35.0–47.0)
HEMOGLOBIN: 9.4 g/dL — AB (ref 12.0–16.0)
MCH: 34.3 pg — AB (ref 26.0–34.0)
MCHC: 33.9 g/dL (ref 32.0–36.0)
MCV: 101.1 fL — ABNORMAL HIGH (ref 80.0–100.0)
PLATELETS: 54 10*3/uL — AB (ref 150–440)
RBC: 2.75 MIL/uL — AB (ref 3.80–5.20)
RDW: 22.2 % — ABNORMAL HIGH (ref 11.5–14.5)
WBC: 11.5 10*3/uL — AB (ref 3.6–11.0)

## 2015-12-25 LAB — AMMONIA: AMMONIA: 81 umol/L — AB (ref 9–35)

## 2015-12-25 MED ORDER — LACTULOSE 10 GM/15ML PO SOLN
30.0000 g | Freq: Two times a day (BID) | ORAL | Status: DC
  Administered 2015-12-25: 30 g via ORAL
  Filled 2015-12-25: qty 60

## 2015-12-25 MED ORDER — LACTULOSE 10 GM/15ML PO SOLN: 10.0000 g | Freq: Two times a day (BID) | ORAL | Status: DC

## 2015-12-25 NOTE — Plan of Care (Signed)
Problem: Bowel/Gastric: Goal: Will not experience complications related to bowel motility Outcome: Progressing Plan of care progress: -remains jaundice, edema generalized -continue to monitor LFT, bilirubin -continues lactulose, increased today by GI, GI following -paracentesis on 1/3 -continues IV abts -assisted with turning and repositioning in bed -remains free from fall/injury call bell in reach -no complaints of pain, no distress or discomfort noted

## 2015-12-25 NOTE — Plan of Care (Signed)
Problem: Safety: Goal: Ability to remain free from injury will improve Outcome: Progressing On high fall precaution per policy. Uses bedpan with assist.  Problem: Pain Managment: Goal: General experience of comfort will improve Outcome: Progressing Complained of pain, PRN pain med given with improvement.  Problem: Physical Regulation: Goal: Will remain free from infection Outcome: Progressing Continues on IV fluids.

## 2015-12-25 NOTE — Progress Notes (Signed)
Initial Nutrition Assessment   INTERVENTION:   Meals and Snacks: Cater to patient preferences Medical Food Supplement Therapy: will recommend on follow if intake decreased Coordination of Care: will recommend collecting daily weights   NUTRITION DIAGNOSIS:   Increased nutrient needs related to chronic illness as evidenced by estimated needs.  GOAL:   Patient will meet greater than or equal to 90% of their needs  MONITOR:    (Energy Intake, Hepatic Profile, Anthropometrics, Digestive System)  REASON FOR ASSESSMENT:   LOS    ASSESSMENT:   Pt admitted with hypovolemic shock secondary to chronic diarrhea with sepsis. Pt with h/o EtOH liver cirrhosis admitted with lower extremity edmea and 3rd spacing. Pt s/p paracentesis on 1/3.  Past Medical History  Diagnosis Date  . Hypertension   . Seizures ([place])   . Chronic kidney disease   . Alcoholic cirrhosis of liver (HCC)      Diet Order:  Diet regular Room service appropriate?: Yes; Fluid consistency:: Thin    Current Nutrition: Pt mumbled yes when asked if she ate breakfast this am before falling back asleep on visit.   Food/Nutrition-Related History: Recorded po intake 100% of meals and one 50% of meal since admission, limited documentation as pt on isolation. Per MST no decrease in appetite.   Scheduled Medications:  .  ceFAZolin (ANCEF) IV  1 g Intravenous Daily  . lactulose  10 g Oral BID  . multivitamin with minerals  1 tablet Oral Daily  . pantoprazole  40 mg Oral QAC breakfast  . potassium chloride  40 mEq Oral BID  . predniSONE  40 mg Oral Q breakfast  . sodium chloride  3 mL Intravenous Q12H    Continuous Medications:  . sodium chloride 100 mL/hr at 12/25/15 0938     Electrolyte/Renal Profile and Glucose Profile:   Recent Labs Lab 12/22/15 0526 12/24/15 0506 12/25/15 0530  NA 134* 133*  134* 136  K 3.1* 4.0  3.9 4.4  CL 112* 114*  115* 117*  CO2 15* 16*  16* 15*  BUN 14 14  15 15    CREATININE SEE COMMENTS UNABLE TO REPORT DUE TO ICTERIC INTERFERENCE  UNABLE TO REPORT DUE TO ICTERUS SEE COMMENTS  CALCIUM 6.7* 7.1*  7.1* 7.4*  GLUCOSE 120* 98  102* 111*   Protein Profile:   Recent Labs Lab 12/21/15 0631 12/24/15 0506 12/25/15 0530  ALBUMIN 1.3* 1.5* 1.4*    Gastrointestinal Profile: Last BM:  12/24/2015   Nutrition-Focused Physical Exam Findings:  Unable to complete Nutrition-Focused physical exam at this time. RD notes 4+ bilateral lower extremity edema documented per Nsg.   Weight Change: Per CHL weight has been stable since last month; no new weight since admission.    Height:   Ht Readings from Last 1 Encounters:  12/19/15 5\' 2"  (1.575 m)    Weight:   Wt Readings from Last 1 Encounters:  12/19/15 220 lb (99.791 kg)   Wt Readings from Last 10 Encounters:  12/19/15 220 lb (99.791 kg)  11/30/15 220 lb (99.791 kg)  11/06/15 214 lb (97.07 kg)  08/14/15 216 lb (97.977 kg)    Ideal Body Weight:   50kg  BMI:  Body mass index is 40.23 kg/(m^2).  Estimated Nutritional Needs:   Kcal:  using IBW of 50kg, BEE: 1073kcals, TEE: (IF 1.1-1.3)(AF 1.2) 1417-1674kcals  Protein:  55-65g protein (1.1-1.3g/kg)  Fluid:  1250-1551mL of fluid (25-30mL/kg)  EDUCATION NEEDS:   No education needs identified at this time   [place]  Level  Dwyane Luo, RD, LDN Pager [phone] Weekend/On-Call Pager [phone]

## 2015-12-25 NOTE — Progress Notes (Signed)
Visit made. Patient seen sleeping soundly. Per chart note review patient remains on IV antibiotics, now on prednisone taper per GI note. Of note patient lives with only her son, she is no longer ambulatory and per conversation with staff aide Vikki Ports she has been incontinent of urine today, eating 50% of her meals. Writer spoke this morning with Dr. Margaretmary Eddy regarding patient's living situation, and the hospice team's concern about this,  she planned to request a  Palliative care consult. Abigail Cook is known to palliative Care. No discharge plan at this time. Will continue to follow and update hospice team. Flo Shanks RN, BSN, [facility] [place], [facility] [phone] c

## 2015-12-25 NOTE — Progress Notes (Signed)
[facility] [place] NAME: Abigail Cook    MR#:  [id number]  DATE OF BIRTH:  07-17-1966   pcp - [facility]  SUBJECTIVE:   Patient  had paracentesis  12/24/2015. Tolerated procedure well.  Patient is jaundiced.   REVIEW OF SYSTEMS:    Review of Systems  Constitutional: Negative for fever, chills and malaise/fatigue.  HENT: Negative for sore throat.   Eyes: Negative for blurred vision.  Respiratory: Negative for cough, hemoptysis, shortness of breath and wheezing.   Cardiovascular: Positive for leg swelling. Negative for chest pain and palpitations.  Gastrointestinal: Negative for nausea, vomiting, abdominal pain, diarrhea and blood in stool.  Genitourinary: Negative for dysuria.  Musculoskeletal: Negative for back pain.  Neurological: Negative for dizziness, tremors and headaches.  Endo/Heme/Allergies: Does not bruise/bleed easily.    Tolerating Diet: Yes      DRUG ALLERGIES:  No Known Allergies  VITALS:  Blood pressure 105/69, pulse 65, temperature 97.7 F (36.5 C), temperature source Oral, resp. rate 20, height 5\' 2"  (1.575 m), weight 99.791 kg (220 lb), SpO2 99 %.  PHYSICAL EXAMINATION:   Physical Exam  Constitutional: She is oriented to person, place, and time and well-developed, well-nourished, and in no distress. No distress.  HENT:  Head: Normocephalic.  Eyes: Scleral icterus is present.  Neck: Normal range of motion. Neck supple. No JVD present. No tracheal deviation present.  Cardiovascular: Normal rate, regular rhythm and normal heart sounds.  Exam reveals no gallop and no friction rub.   No murmur heard. Pulmonary/Chest: Effort normal and breath sounds normal. No respiratory distress. She has no wheezes. She has no rales. She exhibits no tenderness.  Abdominal: Soft. Bowel sounds are normal. She exhibits distension. She exhibits no mass. There is no tenderness. There is no rebound and no  guarding.  Musculoskeletal: Normal range of motion. She exhibits edema.  Neurological: She is alert and oriented to person, place, and time.  Skin: Skin is warm. No rash noted. No erythema.  Psychiatric: Affect and judgment normal.      LABORATORY PANEL:   CBC  Recent Labs Lab 12/25/15 0530  WBC 11.5*  HGB 9.4*  HCT 27.8*  PLT 54*   ------------------------------------------------------------------------------------------------------------------  Chemistries   Recent Labs Lab 12/25/15 0530  NA 136  K 4.4  CL 117*  CO2 15*  GLUCOSE 111*  BUN 15  CREATININE SEE COMMENTS  CALCIUM 7.4*  AST 81*  ALT 31  ALKPHOS 201*  BILITOT 22.8*   ------------------------------------------------------------------------------------------------------------------  Cardiac Enzymes  Recent Labs Lab 12/19/15 1114  TROPONINI 0.03   ------------------------------------------------------------------------------------------------------------------  RADIOLOGY:  US Paracentesis  12/24/2015  CLINICAL DATA:  Ascites. EXAM: ULTRASOUND GUIDED PARACENTESIS COMPARISON:  None. PROCEDURE: An ultrasound guided paracentesis was thoroughly discussed with the patient and questions answered. The benefits, risks, alternatives and complications were also discussed. The patient understands and wishes to proceed with the procedure. Written consent was obtained. Ultrasound was performed to localize and mark an adequate pocket of fluid in the right lower quadrant of the abdomen. The area was then prepped and draped in the normal sterile fashion. 1% Lidocaine was used for local anesthesia. Under ultrasound guidance a Safe-T-Centesis catheter was introduced. Paracentesis was performed. The catheter was removed and a dressing applied. COMPLICATIONS: None immediate. FINDINGS: A total of approximately 1.7 L of serous fluid was removed. A fluid sample was sent for laboratory analysis. IMPRESSION: Successful ultrasound  guided paracentesis yielding 1.7 L of ascites. Electronically Signed  By: Marijo Conception, M.D.   On: 12/24/2015 13:54     ASSESSMENT AND PLAN:    50 year old female with a history of EtOH liver cirrhosis who presents with hypovolemic shock.  1. Hypovolemic shock: resolved. This is secondary to chronic diarrhea. Resolved, off pressors  . 2. Urinary tract infection: Treated with  Ancef. urine culture with multiple specimens, suggests recollection. Ancef  discontinued 12/25/15  3. End-stage liver disease: Patient has lower extremity edema and ascites on physical examination. Marland Kitchen   Ultrasound does show evidence of ascites. Pt had  paracentesis to evaluate for SBP. Appreciate GI recommendations    Continue to hold nadolol and diuretics.  Her lower extremity edema is likely due to a combination of liver cirrhosis and third spacing. Patient will eventually need to be restarted on Lasix and spironolactone due to her cirrhosis once BP is stable  Creatinine cannot be Calculated due to elevated bilirubin. She is being followed by hospice. Palliative care consult is pending  4. Diarrhea: diarrhea has improved. C. difficile is negative as well as stool GI panel.  5. Hyponatremia: This is secondary to liver cirrhosis.  Improving - na -136   6. Thrombocytopenia: This is secondary to end-stage liver disease. Platelets - 54   Management plans discussed with the patient and she is in agreement.  CODE STATUS: DNR, hospice care at home  [place] THIS PATIENT: 35  Minutes .   Once patient is off of pressors consider physical therapy consultation. POSSIBLE D/C 3-4 days , DEPENDING ON CLINICAL CONDITION.   [name] M.D on 12/25/2015 at 8:35 PM  Between 7am to 6pm - Pager - [phone] After 6pm go to [email or web address] - password [email or web address] [facility]  Office  [phone]  CC: Primary care physician; Nino Glow McLean-Scocozza, MD  Note: This dictation was  prepared with Dragon dictation along with smaller phrase technology. Any transcriptional errors that result from this process are unintentional.

## 2015-12-26 DIAGNOSIS — E876 Hypokalemia: Secondary | ICD-10-CM

## 2015-12-26 DIAGNOSIS — D696 Thrombocytopenia, unspecified: Secondary | ICD-10-CM

## 2015-12-26 DIAGNOSIS — E43 Unspecified severe protein-calorie malnutrition: Secondary | ICD-10-CM

## 2015-12-26 DIAGNOSIS — Z66 Do not resuscitate: Secondary | ICD-10-CM

## 2015-12-26 DIAGNOSIS — K7030 Alcoholic cirrhosis of liver without ascites: Secondary | ICD-10-CM

## 2015-12-26 DIAGNOSIS — Z515 Encounter for palliative care: Secondary | ICD-10-CM

## 2015-12-26 DIAGNOSIS — R630 Anorexia: Secondary | ICD-10-CM

## 2015-12-26 DIAGNOSIS — B1920 Unspecified viral hepatitis C without hepatic coma: Secondary | ICD-10-CM

## 2015-12-26 DIAGNOSIS — K922 Gastrointestinal hemorrhage, unspecified: Secondary | ICD-10-CM

## 2015-12-26 DIAGNOSIS — K766 Portal hypertension: Secondary | ICD-10-CM

## 2015-12-26 DIAGNOSIS — Z608 Other problems related to social environment: Secondary | ICD-10-CM

## 2015-12-26 DIAGNOSIS — K7040 Alcoholic hepatic failure without coma: Secondary | ICD-10-CM

## 2015-12-26 DIAGNOSIS — E871 Hypo-osmolality and hyponatremia: Secondary | ICD-10-CM

## 2015-12-26 DIAGNOSIS — N179 Acute kidney failure, unspecified: Secondary | ICD-10-CM

## 2015-12-26 DIAGNOSIS — E872 Acidosis: Secondary | ICD-10-CM

## 2015-12-26 DIAGNOSIS — R079 Chest pain, unspecified: Secondary | ICD-10-CM

## 2015-12-26 LAB — COMPREHENSIVE METABOLIC PANEL
ALBUMIN: 1.5 g/dL — AB (ref 3.5–5.0)
ALBUMIN: 1.6 g/dL — AB (ref 3.5–5.0)
ALK PHOS: 225 U/L — AB (ref 38–126)
ALK PHOS: 240 U/L — AB (ref 38–126)
ALT: 37 U/L (ref 14–54)
ALT: 38 U/L (ref 14–54)
ANION GAP: 3 — AB (ref 5–15)
AST: 104 U/L — AB (ref 15–41)
AST: 109 U/L — ABNORMAL HIGH (ref 15–41)
Anion gap: 2 — ABNORMAL LOW (ref 5–15)
BILIRUBIN TOTAL: 22.2 mg/dL — AB (ref 0.3–1.2)
BILIRUBIN TOTAL: 25.5 mg/dL — AB (ref 0.3–1.2)
BUN: 14 mg/dL (ref 6–20)
BUN: 16 mg/dL (ref 6–20)
CALCIUM: 7.7 mg/dL — AB (ref 8.9–10.3)
CALCIUM: 8.1 mg/dL — AB (ref 8.9–10.3)
CO2: 15 mmol/L — ABNORMAL LOW (ref 22–32)
CO2: 15 mmol/L — ABNORMAL LOW (ref 22–32)
CREATININE: UNDETERMINED mg/dL (ref 0.44–1.00)
Chloride: 121 mmol/L — ABNORMAL HIGH (ref 101–111)
Chloride: 119 mmol/L — ABNORMAL HIGH (ref 101–111)
Creatinine, Ser: UNDETERMINED mg/dL (ref 0.44–1.00)
GLUCOSE: 103 mg/dL — AB (ref 65–99)
GLUCOSE: 115 mg/dL — AB (ref 65–99)
Potassium: 4.5 mmol/L (ref 3.5–5.1)
Potassium: 4.6 mmol/L (ref 3.5–5.1)
Sodium: 138 mmol/L (ref 135–145)
Sodium: 137 mmol/L (ref 135–145)
TOTAL PROTEIN: 4.7 g/dL — AB (ref 6.5–8.1)
TOTAL PROTEIN: 4.8 g/dL — AB (ref 6.5–8.1)

## 2015-12-26 LAB — CBC
HEMATOCRIT: 31 % — AB (ref 35.0–47.0)
HEMOGLOBIN: 10.4 g/dL — AB (ref 12.0–16.0)
MCH: 34.2 pg — ABNORMAL HIGH (ref 26.0–34.0)
MCHC: 33.6 g/dL (ref 32.0–36.0)
MCV: 101.7 fL — AB (ref 80.0–100.0)
Platelets: 63 10*3/uL — ABNORMAL LOW (ref 150–440)
RBC: 3.05 MIL/uL — ABNORMAL LOW (ref 3.80–5.20)
RDW: 21.6 % — ABNORMAL HIGH (ref 11.5–14.5)
WBC: 11 10*3/uL (ref 3.6–11.0)

## 2015-12-26 LAB — AMMONIA
Ammonia: 80 umol/L — ABNORMAL HIGH (ref 9–35)
Ammonia: 126 umol/L — ABNORMAL HIGH (ref 9–35)

## 2015-12-26 MED ORDER — LACTULOSE 10 GM/15ML PO SOLN
30.0000 g | Freq: Three times a day (TID) | ORAL | Status: DC
  Administered 2015-12-26: 30 g via ORAL
  Filled 2015-12-26: qty 60

## 2015-12-26 MED ORDER — RIFAXIMIN 550 MG PO TABS
550.0000 mg | ORAL_TABLET | Freq: Two times a day (BID) | ORAL | Status: DC
  Administered 2015-12-26: 19:00:00 550 mg via ORAL
  Filled 2015-12-26: qty 1

## 2015-12-26 MED ORDER — LACTULOSE 10 GM/15ML PO SOLN
30.0000 g | Freq: Four times a day (QID) | ORAL | Status: DC
  Administered 2015-12-26 – 2015-12-27 (×3): 30 g via ORAL
  Filled 2015-12-26 (×3): qty 60

## 2015-12-26 MED ORDER — SPIRONOLACTONE 25 MG PO TABS
12.5000 mg | ORAL_TABLET | Freq: Every day | ORAL | Status: DC
  Administered 2015-12-26: 12.5 mg via ORAL
  Filled 2015-12-26: qty 1

## 2015-12-26 NOTE — Consult Note (Signed)
Palliative Medicine Inpatient Consult Note   Name: Abigail Cook Date: 12/26/2015 MRN: [id number]  DOB: June 08, 1966  Referring Physician: [name], MD  Palliative Care consult requested for this 50 y.o. female for goals of medical therapy in patient with terminal alcoholic cirrhosis.  TODAY'S DISCUSSIONS AND DECISIONS: Pt seen and examined. Pt well known to me (along with her history).   She is too lethargic for me to talk with this evening. Nursing reports she hasn't eaten all day and has been lethargic all day. I am not sure if she is getting all of her meds but will review MAR.   I see three complete trays of food that are completely untouched in the room still and discussed pts intake (or lack thereof) with nursing staff.  Ammonia level pending for am. She is DNR and I placed a portable form in the paper chart.   Pt is in an untenable living situation --in a third floor walk up apartment with her son who is mentally ill. She has always been quite terminal since her presentation here several months ago with a total bilirubin of 28. She is still quite terminal. Will talk with care mgmt and social worker to see what we can come up with in terms of a possible alternate disposition other than home --which is not working for pt (people can hardly get her up those stairs etc reportedly).   More to follow when I have more conversations tomorrow.   IMPRESSION: 1. 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 Hyponatremia --treated with rehydration 3. Hypokalemia --treated 4. Acute on chronic renal failure ----creatinine not calculable  5. Metabolic Acidosis 6. Chest Pain 7. H/O Acute Blood Loss Anemia from GI bleed  8. Hepatitis C  --- Not a candidate for treatment at this time given her degree of  liver and renal failure 9.  Severe protein calorie malnutrition and anorexia associated with her liver disease 10.  Hypovolemic shock --resolved 11.  Diarrhea --C diff negative and likely related to her lactulose and also liver disease 12.  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  SOCIAL HISTORY:  reports that she has quit smoking. She does not have any smokeless tobacco history on file. She reports that she does not drink alcohol or use illicit drugs.  LEGAL DOCUMENTS:  She is known to be DNR status but there was no portable DNR form in the paper chart. I have completed one of these and placed it in the paper chart.   CODE STATUS: DNR  PAST MEDICAL HISTORY: Past Medical History  Diagnosis Date  . Hypertension   . Seizures ([place])   . Chronic kidney disease   . Alcoholic cirrhosis of liver ([place])     PAST SURGICAL HISTORY:  Past Surgical History  Procedure Laterality Date  . Rod placed in right leg/foot    . Esophagogastroduodenoscopy Left 11/18/2015    Procedure: ESOPHAGOGASTRODUODENOSCOPY (EGD);  Surgeon: Hulen Luster, MD;  Location: [facility] ENDOSCOPY;  Service: Endoscopy;  Laterality: Left;  Marland Kitchen Mandible surgery      ALLERGIES:  has No Known Allergies.  MEDICATIONS:  Current Facility-Administered Medications  Medication Dose Route Frequency Provider Last Rate Last Dose  . 0.9 %  sodium chloride infusion   Intravenous Continuous Colleen Can, MD 40 mL/hr at 12/26/15 1908    . calcium carbonate (TUMS - dosed in mg elemental calcium) chewable tablet 200 mg of elemental calcium  1 tablet Oral TID PRN Lytle Butte, MD   200 mg of elemental calcium at 12/24/15 2138  . ipratropium-albuterol (DUONEB) 0.5-2.5 (3) MG/3ML nebulizer solution 3 mL  3 mL  Nebulization Q6H PRN Bettey Costa, MD   3 mL at 12/24/15 0016  . [START ON 12/27/2015] lactulose (CHRONULAC) 10 GM/15ML solution 30 g  30 g Oral 4 times per day [name], MD   30 g at 12/26/15 1904  . multivitamin with minerals tablet 1 tablet  1 tablet Oral Daily Loletha Grayer, MD   1 tablet at 12/26/15 1203  . ondansetron (ZOFRAN) tablet 4 mg  4 mg Oral Q6H PRN Loletha Grayer, MD       Or  . ondansetron [facility]) injection 4 mg  4 mg Intravenous Q6H PRN Loletha Grayer, MD   4 mg at 12/24/15 0150  . oxyCODONE (Oxy IR/ROXICODONE) immediate release tablet 5 mg  5 mg Oral Q4H PRN Loletha Grayer, MD   5 mg at 12/26/15 0500  . pantoprazole (PROTONIX) EC tablet 40 mg  40 mg Oral QAC breakfast Loletha Grayer, MD   40 mg at 12/26/15 1203  . potassium chloride SA (K-DUR,KLOR-CON) CR tablet 40 mEq  40 mEq Oral BID Bettey Costa, MD   40 mEq at 12/26/15 1203  . predniSONE (DELTASONE) tablet 40 mg  40 mg Oral Q breakfast Josefine Class, MD   40 mg at 12/26/15 1203  . rifaximin Doreene Nest) tablet 550 mg  550 mg Oral BID Josefine Class, MD   550 mg at 12/26/15 1904  . sodium chloride 0.9 % injection 3 mL  3 mL Intravenous Q12H Loletha Grayer, MD   3 mL at 12/26/15 2200  . spironolactone (ALDACTONE) tablet 12.5 mg  12.5 mg Oral Daily [name], MD   12.5 mg at 12/26/15 1904    Vital Signs: BP 104/62 mmHg  Pulse 64  Temp(Src) 97.4 F (36.3 C) (Oral)  Resp 20  Ht 5\' 2"  (1.575 m)  Wt 113.172 kg (249 lb 8 oz)  BMI 45.62 kg/m2  SpO2 100% Filed Weights   12/19/15 1116 12/25/15 2100 12/26/15 0500  Weight: 99.791 kg (220 lb) 113.762 kg (250 lb 12.8 oz) 113.172 kg (249 lb 8 oz)    Estimated body mass index is 45.62 kg/(m^2) as calculated from the following:   Height as of this encounter: 5\' 2"  (1.575 m).   Weight as of this encounter: 113.172 kg (249 lb 8 oz).  PERFORMANCE STATUS (ECOG) : 4 - Bedbound  As of lately  PHYSICAL EXAM: Too lethargic for me to talk with  Opens eyes weakly but  does not focus or speak Is otherwise in no distress hrt rrr no m Lungs cta ant upper  Abd obese soft nt Ext no cyanosis or mottling  LABS: CBC:    Component Value Date/Time   WBC 11.0 12/26/2015 0447   WBC 8.6 07/26/2014 0432   HGB 10.4* 12/26/2015 0447   HGB 9.1* 07/26/2014 0432   HCT 31.0* 12/26/2015 0447   HCT 28.5* 07/26/2014 0432   PLT 63* 12/26/2015 0447   PLT 134* 07/26/2014 0432   MCV 101.7* 12/26/2015 0447   MCV  76* 07/26/2014 0432   NEUTROABS 6.5 12/19/2015 1114   NEUTROABS 5.7 07/26/2014 0432   LYMPHSABS 0.6* 12/19/2015 1114   LYMPHSABS 1.6 07/26/2014 0432   MONOABS 0.7 12/19/2015 1114   MONOABS 1.1* 07/26/2014 0432   EOSABS 0.0 12/19/2015 1114   EOSABS 0.2 07/26/2014 0432   BASOSABS 0.0 12/19/2015 1114   BASOSABS 0.1 07/26/2014 0432   BASOSABS 1 07/22/2014 1931   Comprehensive Metabolic Panel:    Component Value Date/Time   NA 138 12/26/2015 0447   NA 136 07/25/2014 1337   K 4.5 12/26/2015 0447   K 3.6 07/25/2014 1337   CL 121* 12/26/2015 0447   CL 103 07/25/2014 1337   CO2 15* 12/26/2015 0447   CO2 26 07/25/2014 1337   BUN 14 12/26/2015 0447   BUN 7 07/25/2014 1337   CREATININE UNABLE TO REPORT DUE TO ICTERUS 12/26/2015 0447   CREATININE 0.91 07/25/2014 1337   GLUCOSE 103* 12/26/2015 0447   GLUCOSE 121* 07/25/2014 1337   CALCIUM 7.7* 12/26/2015 0447   CALCIUM 8.7 07/25/2014 1337   AST 104* 12/26/2015 0447   AST 67* 07/22/2014 1931   ALT 37 12/26/2015 0447   ALT 38 07/22/2014 1931   ALKPHOS 225* 12/26/2015 0447   ALKPHOS 60 07/22/2014 1931   BILITOT 22.2* 12/26/2015 0447   BILITOT 0.5 07/22/2014 1931   PROT 4.7* 12/26/2015 0447   PROT 8.7* 07/22/2014 1931   ALBUMIN 1.5* 12/26/2015 0447   ALBUMIN 3.0* 07/22/2014 1931     More than 50% of the visit was spent in counseling/coordination of care: Yes  Time Spent:  80 minutes

## 2015-12-26 NOTE — Progress Notes (Signed)
GI Inpatient Follow-up Note  Patient Identification: Abigail Cook is a 50 y.o. female with decomp ETOH cirrhosis c/b ascites, HE  Subjective:  NO complaints. Somewhat somnolent. Nurse and pt report one stool today.  Lactulose increased to 30 gr TID.  No abd pain, no f/c. Poor appetite.   Scheduled Inpatient Medications:  . lactulose  30 g Oral TID  . multivitamin with minerals  1 tablet Oral Daily  . pantoprazole  40 mg Oral QAC breakfast  . potassium chloride  40 mEq Oral BID  . predniSONE  40 mg Oral Q breakfast  . sodium chloride  3 mL Intravenous Q12H    Continuous Inpatient Infusions:   . sodium chloride 40 mL/hr at 12/25/15 1921    PRN Inpatient Medications:  calcium carbonate, ipratropium-albuterol, ondansetron **OR** ondansetron (ZOFRAN) IV, oxyCODONE  Review of Systems: Constitutional: Weight is up Eyes: No changes in vision. ENT: No oral lesions, sore throat.  GI: see HPI.  Heme/Lymph: +easy bruising.  CV: No chest pain.  GU: No hematuria.  Integumentary: No rashes.  Neuro: No headaches.  Psych: No depression/anxiety.  Endocrine: No heat/cold intolerance.  Allergic/Immunologic: No urticaria.  Resp: No cough, SOB.  Musculoskeletal: No joint swelling.    Physical Examination: BP 104/62 mmHg  Pulse 64  Temp(Src) 97.4 F (36.3 C) (Oral)  Resp 20  Ht 5\' 2"  (1.575 m)  Wt 113.172 kg (249 lb 8 oz)  BMI 45.62 kg/m2  SpO2 100% Gen: NAD, alert and oriented x 2, appears chronically ill Neck: supple no JVD or thyromegaly Chest: CTA bilaterally, no wheezes, crackles, or other adventitious sounds CV: RRR, no m/g/c/r Abd: soft, NT, ND, +BS in all four quadrants; no HSM, guarding, ridigity, or rebound tenderness Ext: no edema, well perfused with 2+ pulses, Skin: no rash or lesions noted Lymph: no LAD  Data: Lab Results  Component Value Date   WBC 11.0 12/26/2015   HGB 10.4* 12/26/2015   HCT 31.0* 12/26/2015   MCV 101.7* 12/26/2015   PLT 63*  12/26/2015    Recent Labs Lab 12/24/15 0506 12/25/15 0530 12/26/15 0447  HGB 9.6* 9.4* 10.4*   Lab Results  Component Value Date   NA 138 12/26/2015   K 4.5 12/26/2015   CL 121* 12/26/2015   CO2 15* 12/26/2015   BUN 14 12/26/2015   CREATININE UNABLE TO REPORT DUE TO ICTERUS 12/26/2015   Lab Results  Component Value Date   ALT 37 12/26/2015   AST 104* 12/26/2015   ALKPHOS 225* 12/26/2015   BILITOT 22.2* 12/26/2015   No results for input(s): APTT, INR, PTT in the last 168 hours.   Assessment/Plan: Ms. Hemmings is a 50 y.o. female with decomp ETOH cirrhosis c/b ascites. + asterixis, somewhat somnolent, likely developing H.E.   Recommendations: - watch for worsening H.E.   titrate lactulose to 2 stools per day, hopefully can avoid hypotension - start xifaxin 550 BID - low Na diet - avoid sedating medds - start low dose spiro/lasix  IF / WHEN able.    Please call with questions or concerns.  REIN, Grace Blight, MD

## 2015-12-26 NOTE — Progress Notes (Signed)
Pallaitive Care Update  Pt seen and examined. Pt well known to me (along with her history).    She is too lethargic for me to talk with this evening. Nursing reports she hasn't eaten all day and has been lethargic all day.  I am not sure if she is getting all of her meds but will review MAR.   I see three complete trays of food that are completely untouched in the room still and discussed pts intake (or lack thereof) with nursing staff.  Ammonia level pending for am. She is DNR and I placed a portable form in the paper chart.   Pt is in an untenable living situation --in a third floor walk up apartment with her son who is mentally ill.  She has always been quite terminal since her presentation here several months ago with a total bilirubin of 28.  She is still quite terminal.  Will talk with care mgmt and social worker to see what we can come up with in terms of a possible alternate disposition other than home --which is not working for pt (people can hardly get her up those stairs etc reportedly).    More to follow when I have more conversations tomorrow.  Colleen Can, MD

## 2015-12-26 NOTE — Progress Notes (Signed)
PT Cancellation Note  Patient Details Name: Abigail Cook MRN: [id number] DOB: December 02, 1966   Cancelled Treatment:    Reason Eval/Treat Not Completed: Fatigue/lethargy limiting ability to participate (Consult received and chart reviewed.  Evaluation attempted.  Patient sleeping soundly; opens eyes briefly with max cuing from therapist, but unable to maintain for adequate participation with evaluation.  Will re-attempt at later time/date as medically appropriate and patient able to participate.)   Dawud Mays H. Owens Shark, PT, DPT, NCS 12/26/2015, 11:09 AM [phone]

## 2015-12-26 NOTE — Progress Notes (Addendum)
Abigail Cook at Abigail Cook NAME: Abigail Cook    MR#:  [id number]  DATE OF BIRTH:  [birth year]   pcp - [facility]  SUBJECTIVE:   Patient is lthargic today, RN reorting one  Bowl movement in am.  Pt  had paracentesis  12/24/2015. Tolerated procedure well.  Patient is jaundiced.   REVIEW OF SYSTEMS:    Review of Systems  Unable to perform ROS   Tolerating Diet: Yes      DRUG ALLERGIES:  No Known Allergies  VITALS:  Blood pressure 104/62, pulse 64, temperature 97.4 F (36.3 C), temperature source Oral, resp. rate 20, height 5\' 2"  (1.575 m), weight 113.172 kg (249 lb 8 oz), SpO2 100 %.  PHYSICAL EXAMINATION:   Physical Exam  Constitutional: She is well-developed, well-nourished, and in no distress. No distress.  HENT:  Head: Normocephalic.  Eyes: Scleral icterus is present.  Neck: Normal range of motion. Neck supple. No JVD present. No tracheal deviation present.  Cardiovascular: Normal rate, regular rhythm and normal heart sounds.  Exam reveals no gallop and no friction rub.   No murmur heard. Pulmonary/Chest: Effort normal and breath sounds normal. No respiratory distress. She has no wheezes. She has no rales. She exhibits no tenderness.  Abdominal: Soft. Bowel sounds are normal. She exhibits distension. She exhibits no mass. There is no tenderness. There is no rebound and no guarding.  Musculoskeletal: Normal range of motion. She exhibits edema.  Neurological:  Patient is lethargic  Skin: Skin is warm. No rash noted. No erythema.  Psychiatric:  Lethargic      LABORATORY PANEL:   CBC  Recent Labs Lab 12/26/15 0447  WBC 11.0  HGB 10.4*  HCT 31.0*  PLT 63*   ------------------------------------------------------------------------------------------------------------------  Chemistries   Recent Labs Lab 12/26/15 0447  NA 138  K 4.5  CL 121*  CO2 15*  GLUCOSE 103*  BUN 14  CREATININE UNABLE  TO REPORT DUE TO ICTERUS  CALCIUM 7.7*  AST 104*  ALT 37  ALKPHOS 225*  BILITOT 22.2*   ------------------------------------------------------------------------------------------------------------------  Cardiac Enzymes No results for input(s): TROPONINI in the last 168 hours. ------------------------------------------------------------------------------------------------------------------  RADIOLOGY:  No results found.   ASSESSMENT AND PLAN:    50 year old female with a history of EtOH liver cirrhosis who presents with hypovolemic shock.  1.  Altered mental status secondary to hepatic encephalopathy and UTI with history of End-stage liver disease:  Patient has lower extremity edema and ascites on physical examination. Marland Kitchen   Ultrasound does show evidence of ascites. Pt had  paracentesis to evaluate for SBP. Appreciate GI recommendations    Continue to hold nadolol and starting patient on small dose spironolactone as blood pressure is better, holding Lasix diuretics.  Her lower extremity edema is likely due to a combination of liver cirrhosis and third spacing. Patient  restarted on spironolactone due to her cirrhosis and will consider restarting Lasix once BP is stable  Creatinine cannot be Calculated due to elevated bilirubin. She is being followed by hospice. Palliative care consult is pending Lactulose by mouth 4 times a day until good bowel movement and xifixan Taper off prednisone 40 mg for 4 days, 30 for 4 days, 20 mg for 4 days, 10 mg for 4 days, 5 mg for 4 days and stop   . 2. Urinary tract infection: Treated with  Ancef. urine culture with multiple specimens, suggests recollection. Ancef  discontinued 12/25/15  3. Hypovolemic shock: resolved. This is secondary  to chronic diarrhea. Resolved, off pressors  .  4. Diarrhea: diarrhea has improved. C. difficile is negative as well as stool GI panel.  5. Hyponatremia: This is secondary to liver cirrhosis.  Improving - na  -138   6. Thrombocytopenia: This is secondary to end-stage liver disease. Platelets - 54 -->63   Management plans discussed with the patient and she is in agreement.  CODE STATUS: DNR, hospice care at home  [place] THIS PATIENT: 50  Minutes .   Once patient is off of pressors consider physical therapy consultation. POSSIBLE D/C 3-4 days , DEPENDING ON CLINICAL CONDITION.   [name] M.D on 12/26/2015 at 4:32 PM  Between 7am to 6pm - Pager - [phone] After 6pm go to [email or web address] - password [email or web address] [place] [facility]  Office  [phone]  CC: Primary care physician; Abigail Glow McLean-Scocozza, MD  Note: This dictation was prepared with Dragon dictation along with smaller phrase technology. Any transcriptional errors that result from this process are unintentional.

## 2015-12-26 NOTE — Plan of Care (Signed)
Problem: Safety: Goal: Ability to remain free from injury will improve Outcome: Progressing Pt remains safe this admission.  Use of bed alarm this shift  Problem: Pain Managment: Goal: General experience of comfort will improve Outcome: Progressing Pain controlled with oral pain med x 1  Problem: Skin Integrity: Goal: Risk for impaired skin integrity will decrease Outcome: Progressing Encourage pt to off load from buttock to hips to prevent skin breakdown

## 2015-12-26 NOTE — Plan of Care (Signed)
Problem: Safety: Goal: Ability to remain free from injury will improve Outcome: Progressing Pt calls for assist to br/ bedpan.free from injury  Problem: Health Behavior/Discharge Planning: Goal: Ability to manage health-related needs will improve Outcome: Progressing Cooperative/  Follows command. Acceptable behavior  Problem: Pain Managment: Goal: General experience of comfort will improve Outcome: Progressing No pain med required today  Problem: Physical Regulation: Goal: Ability to maintain clinical measurements within normal limits will improve Outcome: Not Progressing Dr Margaretmary Eddy and dr rein saw today.   Problem: Skin Integrity: Goal: Risk for impaired skin integrity will decrease Outcome: Progressing Cream to bottom encouraged to stay off back.

## 2015-12-26 NOTE — Progress Notes (Signed)
Visit made. Patient lying in bed, gently awakened by voice. She appeared to be more confused and very lethargic, having difficulty staying awake during visit. Writer spoke with staff RN Freddie Breech who reports patient did take her oral medications and drank well earlier in the day. Patient declined any food or drink during visit. Per chart review lactulose has been increased to TID from BID as patient's ammonia level is up to 80 this morning. Physical therapy has not been able to work with patient yet today due to her sleepiness. No discharge plan at this time. Will continue to follow and update hospice team.  Flo Shanks RN, BSN, [place] [facility] [place], [facility] [phone] c

## 2015-12-26 NOTE — Progress Notes (Signed)
Pt incontinent of urine.  Appears to have a few bright red clots of blood to rectum when cleaning perineum. Pt with increased moisture associated incontinence dermatitis to gluteal folds.  Cleansed and applied skin barrier cream.  Pt positioned to right hip with off loading of buttocks with pillow. Pt heels cracked and peeling, red but blanchable.  Applied foam heel protectors after applying 24 hour skin cream.  Floated heels on pillow and elevated foot of bed.  Recommend wound consult and close inspection of possible external hemorrhoid bleeding. Dorna Bloom RN

## 2015-12-27 DIAGNOSIS — R140 Abdominal distension (gaseous): Secondary | ICD-10-CM | POA: Insufficient documentation

## 2015-12-27 LAB — AMMONIA: AMMONIA: 78 umol/L — AB (ref 9–35)

## 2015-12-27 MED ORDER — GLYCOPYRROLATE 1 MG PO TABS: 1.0000 mg | ORAL_TABLET | Freq: Three times a day (TID) | ORAL | Status: DC | PRN

## 2015-12-27 MED ORDER — LORAZEPAM 1 MG PO TABS
1.0000 mg | ORAL_TABLET | ORAL | Status: DC | PRN
  Administered 2015-12-27: 1 mg via ORAL
  Filled 2015-12-27: qty 1

## 2015-12-27 MED ORDER — MORPHINE SULFATE (CONCENTRATE) 10 MG/0.5ML PO SOLN: 5.0000 mg | ORAL | Status: AC | PRN

## 2015-12-27 MED ORDER — PROCHLORPERAZINE 25 MG RE SUPP: 25.0000 mg | Freq: Three times a day (TID) | RECTAL | Status: DC | PRN

## 2015-12-27 MED ORDER — LACTULOSE 10 GM/15ML PO SOLN: 30.0000 g | Freq: Three times a day (TID) | ORAL | Status: AC

## 2015-12-27 MED ORDER — LACTULOSE 10 GM/15ML PO SOLN: 30.0000 g | Freq: Three times a day (TID) | ORAL | Status: DC

## 2015-12-27 MED ORDER — PROCHLORPERAZINE 25 MG RE SUPP: 25.0000 mg | Freq: Three times a day (TID) | RECTAL | Status: AC | PRN

## 2015-12-27 MED ORDER — GLYCOPYRROLATE 1 MG PO TABS
1.0000 mg | ORAL_TABLET | Freq: Three times a day (TID) | ORAL | Status: DC | PRN
  Filled 2015-12-27: qty 1

## 2015-12-27 MED ORDER — MORPHINE SULFATE (CONCENTRATE) 10 MG/0.5ML PO SOLN: 5.0000 mg | ORAL | Status: DC | PRN

## 2015-12-27 MED ORDER — LORAZEPAM 1 MG PO TABS: 1.0000 mg | ORAL_TABLET | ORAL | Status: AC | PRN

## 2015-12-27 MED ORDER — MORPHINE SULFATE (CONCENTRATE) 10 MG/0.5ML PO SOLN
5.0000 mg | ORAL | Status: DC | PRN
  Administered 2015-12-27: 5 mg via ORAL
  Filled 2015-12-27: qty 1

## 2015-12-27 MED ORDER — GLYCOPYRROLATE 1 MG PO TABS: 1.0000 mg | ORAL_TABLET | Freq: Three times a day (TID) | ORAL | Status: AC | PRN

## 2015-12-27 MED ORDER — LACTULOSE 10 GM/15ML PO SOLN
30.0000 g | Freq: Three times a day (TID) | ORAL | Status: DC
  Administered 2015-12-27: 14:00:00 30 g via ORAL
  Filled 2015-12-27: qty 60

## 2015-12-27 NOTE — Progress Notes (Signed)
Report called to the hospice home, EMS notified for transport. Hospital care team aware. Patient remains lethargic/drowsey. Kennyth Lose, patient's peer support person from [facility] in to see patient. Writer advised her of the plan for transfer and also of family's agreement and awareness. Kennyth Lose appreciative of the update. All updated information faxed to referral. Hospice team notified. Flo Shanks RN, BSN, [facility] [place], hospital Liaison [phone] c

## 2015-12-27 NOTE — Progress Notes (Signed)
PT Cancellation Note  Patient Details Name: ZYONA MURLEY MRN: [id number] DOB: 1966/01/17   Cancelled Treatment:    Reason Eval/Treat Not Completed: Medical issues which prohibited therapy (Per discussion with primary RN and palliative care MD Megan Salon), patient now planning transition to [place]; PT not appropriate at this time.  Will complete initial order; please re-consult should needs change.)   Encarnacion Scioneaux H. Owens Shark, PT, DPT, NCS 12/27/2015, 10:48 AM [phone]

## 2015-12-27 NOTE — Plan of Care (Signed)
Problem: Safety: Goal: Ability to remain free from injury will improve Outcome: Progressing Moderate fall risk. Bed alarm on, hourly rounding. Safe environment provided. Pt understands how to use call system for assistance.   Problem: Health Behavior/Discharge Planning: Goal: Ability to manage health-related needs will improve Outcome: Progressing Palliative following for goals of care.   Problem: Pain Managment: Goal: General experience of comfort will improve Outcome: Progressing C/o abdominal pain relieved by PRN Oxycodone x1. Emotional support given.  Problem: Skin Integrity: Goal: Risk for impaired skin integrity will decrease Outcome: Progressing Skin care provided. Q2h turning. Multiple skin tear spots on buttocks, barrier cream applied.    Problem: Activity: Goal: Risk for activity intolerance will decrease Outcome: Progressing Rest periods provided while turning in bed.   Problem: Fluid Volume: Goal: Ability to maintain a balanced intake and output will improve Outcome: Progressing IVF infusing as ordered.   Problem: Nutrition: Goal: Adequate nutrition will be maintained Outcome: Progressing Did not eat any meals yesterday, poor PO intake currently. Encouraged pt to have a bedtime snack but "wasn't hungry"  Problem: Bowel/Gastric: Goal: Will not experience complications related to bowel motility Outcome: Progressing 2 large BM overnight

## 2015-12-27 NOTE — Progress Notes (Signed)
Palliative Medicine Inpatient Consult Follow Up Note   Name: Abigail Cook Date: 12/27/2015 MRN: [id number]  DOB: 1966/02/04  Referring Physician: [name], MD  Palliative Care consult requested for this 50 y.o. 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   ----------------------------------------  TODAY'S DISCUSSIONS AND DECISIONS: Pt agrees to going to [facility] Pts aunt and two cousins were here and I spoke with them. Their names are:   Tawni Carnes                 [id number] 1st cousin, Saunders Glance    [phone] 2nd cousin, Freddy Jaksch     [phone]  Pt has a son, Khaleyah Qasem.  He has mental illness. Family says that he is not reliably able to make decisions for pt. He fails to take his meds for schizophrenia per family.    Family is willing to sign papers for pt as she goes in and out of consciousness. Pt was able to agree to going to [place] this am --but then she went back to sleeping.     There is a bed at [facility] (at first there was not --but there is now).  I will do the med recs for pt.    --------------------------------------------  IMPRESSION: IMPRESSION: 1. 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 Hyponatremia --treated with rehydration 3. Hypokalemia --treated 4. Acute on chronic renal failure ----creatinine not calculable  5. Metabolic Acidosis 6. Chest Pain 7. H/O Acute Blood Loss Anemia from GI bleed  8. Hepatitis C  --- Not a candidate for treatment at this time given her degree of liver and renal failure 9. Severe protein calorie malnutrition and anorexia associated with her liver disease 10. Hypovolemic shock --resolved 11. Diarrhea --C diff negative and likely related  to her lactulose and also liver disease 12. Thrombocytopenia due to end stage liver disease    __________________________________________________  REVIEW OF SYSTEMS:  Patient is not able to provide ROS due to lethargy  CODE STATUS: DNR   PAST MEDICAL HISTORY: Past Medical History  Diagnosis Date  . Hypertension   . Seizures ([place])   . Chronic kidney disease   . Alcoholic cirrhosis of liver ([place])     PAST SURGICAL HISTORY:  Past Surgical History  Procedure Laterality Date  . Rod placed in right leg/foot    . Esophagogastroduodenoscopy Left 11/18/2015    Procedure: ESOPHAGOGASTRODUODENOSCOPY (EGD);  Surgeon: Hulen Luster, MD;  Location: [facility] ENDOSCOPY;  Service: Endoscopy;  Laterality: Left;  Marland Kitchen Mandible surgery      Vital Signs: BP 97/46 mmHg  Pulse 95  Temp(Src) 97.4 F (36.3 C) (Axillary)  Resp 18  Ht 5\' 2"  (1.575 m)  Wt 112.356 kg (247 lb 11.2 oz)  BMI 45.29 kg/m2  SpO2 100% Filed Weights   12/25/15 2100 12/26/15 0500 12/27/15 0500  Weight: 113.762 kg (250 lb 12.8 oz) 113.172 kg (249 lb 8 oz) 112.356 kg (247 lb 11.2 oz)    Estimated body mass index is 45.29 kg/(m^2) as calculated from the following:   Height as of this encounter: 5\' 2"  (1.575 m).   Weight as of this encounter: 112.356 kg (247 lb 11.2 oz).  PHYSICAL EXAM: Sleepy but opens eyes  Focuses Speaks and answers orientation questions --she knows where she is and is able  to say the name of the hospital and describe that she has cirrhosis and is dying soon. She is able to say it is after Christmas and January. She was able to talk in simple terms about Hospice and agrees she would like to go there. EOMI OP clear No JVD or TM hrt rrr no m Lungs cta antlat ABd obese but soft with nl BS EXt no cyanosis or mottling    LABS: CBC:    Component Value Date/Time   WBC 11.0 12/26/2015 0447   WBC 8.6 07/26/2014 0432   HGB 10.4* 12/26/2015 0447   HGB 9.1* 07/26/2014 0432   HCT 31.0* 12/26/2015 0447   HCT  28.5* 07/26/2014 0432   PLT 63* 12/26/2015 0447   PLT 134* 07/26/2014 0432   MCV 101.7* 12/26/2015 0447   MCV 76* 07/26/2014 0432   NEUTROABS 6.5 12/19/2015 1114   NEUTROABS 5.7 07/26/2014 0432   LYMPHSABS 0.6* 12/19/2015 1114   LYMPHSABS 1.6 07/26/2014 0432   MONOABS 0.7 12/19/2015 1114   MONOABS 1.1* 07/26/2014 0432   EOSABS 0.0 12/19/2015 1114   EOSABS 0.2 07/26/2014 0432   BASOSABS 0.0 12/19/2015 1114   BASOSABS 0.1 07/26/2014 0432   BASOSABS 1 07/22/2014 1931   Comprehensive Metabolic Panel:    Component Value Date/Time   NA 137 12/26/2015 1910   NA 136 07/25/2014 1337   K 4.6 12/26/2015 1910   K 3.6 07/25/2014 1337   CL 119* 12/26/2015 1910   CL 103 07/25/2014 1337   CO2 15* 12/26/2015 1910   CO2 26 07/25/2014 1337   BUN 16 12/26/2015 1910   BUN 7 07/25/2014 1337   CREATININE  UNABLE TO RESULT DUE TO ICTERIC INTERFERENCE 12/26/2015 1910   CREATININE 0.91 07/25/2014 1337   GLUCOSE 115* 12/26/2015 1910   GLUCOSE 121* 07/25/2014 1337   CALCIUM 8.1* 12/26/2015 1910   CALCIUM 8.7 07/25/2014 1337   AST 109* 12/26/2015 1910   AST 67* 07/22/2014 1931   ALT 38 12/26/2015 1910   ALT 38 07/22/2014 1931   ALKPHOS 240* 12/26/2015 1910   ALKPHOS 60 07/22/2014 1931   BILITOT 25.5* 12/26/2015 1910   BILITOT 0.5 07/22/2014 1931   PROT 4.8* 12/26/2015 1910   PROT 8.7* 07/22/2014 1931   ALBUMIN 1.6* 12/26/2015 1910   ALBUMIN 3.0* 07/22/2014 1931     More than 50% of the visit was spent in counseling/coordination of care: YES  Time Spent: 55 min

## 2015-12-27 NOTE — Clinical Social Work Note (Signed)
Pt is ready for discharge today to [facility]. Flo Shanks, RN Liaison, is arranging transfer. CSW prepared discharge packet. CSW is signing off as no further needs identified.   Darden Dates, MSW, LCSW  Clinical Social Worker  [phone]

## 2015-12-27 NOTE — Progress Notes (Signed)
Pt for discharge to hospice home/   Central line d/cd per protocol pressure dressing applied.  meds  For anxiety and discomfort earlier resting quietly at present. Ems here to transport pt to hospice home.

## 2015-12-27 NOTE — Progress Notes (Signed)
Visit made. Patient remains lethargic, hard to keep awake. Palliative Medicine physician Dr. Megan Salon has spoke with Sharyn Lull and with her family present and the plan is now for her to transfer to the hospice home for end of life care. She is not eating and is now incontinent, bilateral lower edema continues.  Writer met with patient's cousin Thomes Lolling who has agreed to sign paperwork at the hospice home. Hazel to meet with hospice home [place]. Of note Onalee Hua confirms that she has made Avaley's son aware of transfer. Writer to call report to hospice home and notify EMS for transport. Hospital care team all aware of plan for patient to discharge today via EMS to the hospice home. Thank you. Flo Shanks RN, BSN, [place] [facility] [place], [facility] [phone] c

## 2015-12-27 NOTE — Discharge Summary (Signed)
[facility] [place] NAME: Abigail Cook    MR#:  [id number]  DATE OF BIRTH:  Nov 15, 1966  DATE OF ADMISSION:  12/19/2015 ADMITTING PHYSICIAN: Loletha Grayer, MD  DATE OF DISCHARGE: 12/27/2015 PRIMARY CARE PHYSICIAN: Nino Glow McLean-Scocozza, MD    ADMISSION DIAGNOSIS:  Hyperbilirubinemia [E80.6] Leg pain [M79.606] Bilateral pneumonia [id number] Alcoholic cirrhosis of liver with ascites ([place]) [K70.31] Acute respiratory failure with hypoxia (H[facility]) [J96.01] Sepsis, due to unspecified organism ([place]) [A41.9] Urinary tract infection without hematuria, site unspecified [N39.0]  DISCHARGE DIAGNOSIS:  Active Problems:   Sepsis ([place])   Hypovolemic shock ([place])   Abdominal distention  hepatic encephalopathy related end-stage liver disease  SECONDARY DIAGNOSIS:   Past Medical History  Diagnosis Date  . Hypertension   . Seizures ([place])   . Chronic kidney disease   . Alcoholic cirrhosis of liver [facility])     HOSPITAL COURSE:   50 year old female with a history of EtOH liver cirrhosis who presents with hypovolemic shock.  1. Altered mental status secondary to hepatic encephalopathy and UTI with history of End-stage liver disease: Patient has lower extremity edema and ascites on physical examination. Marland Kitchen  Ultrasound does show evidence of ascites. Pt had paracentesis to evaluate for SBP. Patient is not clinically improving. Evaluated by palliative care and patient is changed to comfort care Appreciate palliative care recommendations Patient will be transferred to hospice home when bed is available today 2. Urinary tract infection:  3. Hypovolemic shock:  .  4. Diarrhea: diarrhea has improved. C. difficile is negative as well as stool GI panel.  5. Hyponatremia: Improving - na -138   6. Thrombocytopenia: This is secondary to end-stage liver disease. Platelets - 54 -->63  will be transferred to hospice home when bed is available  today Management plans discussed with the patient and she is in agreement.  CODE STATUS: DNR, hospice care at home [place] PATIENT:40 Minutes .    DISCHARGE CONDITIONS:   Guarded  CONSULTS OBTAINED:  Treatment Team:  Loletha Grayer, MD   PROCEDURES paracentesis  DRUG ALLERGIES:  No Known Allergies  DISCHARGE MEDICATIONS:   Current Discharge Medication List    START taking these medications   Details  glycopyrrolate (ROBINUL) 1 MG tablet Take 1 tablet (1 mg total) by mouth 3 (three) times daily as needed (excessive secretions). Qty: 15 tablet, Refills: 0    LORazepam (ATIVAN) 1 MG tablet Take 1 tablet (1 mg total) by mouth every 4 (four) hours as needed for anxiety. Qty: 30 tablet, Refills: 0    Morphine Sulfate (MORPHINE CONCENTRATE) 10 MG/0.5ML SOLN concentrated solution Take 0.25 mLs (5 mg total) by mouth every 2 (two) hours as needed (moderate pain or shortness of breath). Qty: 30 mL, Refills: 0    prochlorperazine (COMPAZINE) 25 MG suppository Place 1 suppository (25 mg total) rectally every 8 (eight) hours as needed for nausea or vomiting. Qty: 6 suppository, Refills: 0      CONTINUE these medications which have CHANGED   Details  lactulose (CHRONULAC) 10 GM/15ML solution Take 45 mLs (30 g total) by mouth 3 (three) times daily. Qty: 240 mL, Refills: 0      STOP taking these medications     ipratropium-albuterol (DUONEB) 0.5-2.5 (3) MG/3ML SOLN      Multiple Vitamin (MULTIVITAMIN WITH MINERALS) TABS tablet      mupirocin ointment (BACTROBAN) 2 %      nadolol (CORGARD) 20 MG tablet  pantoprazole (PROTONIX) 40 MG tablet      Potassium Chloride 40 MEQ/15ML (20%) SOLN      albuterol (PROVENTIL HFA;VENTOLIN HFA) 108 (90 BASE) MCG/ACT inhaler          DISCHARGE INSTRUCTIONS:    patient will be transferred to hospice home when bed is available  DIET:  Regular diet  DISCHARGE CONDITION:  Guarded   ACTIVITY:   Activity as tolerated  OXYGEN:  Home Oxygen: prn for comfort care   Oxygen Delivery: room air  DISCHARGE LOCATION:  Hospice home  If you experience worsening of your admission symptoms, develop shortness of breath, life threatening emergency, suicidal or homicidal thoughts you must seek medical attention immediately by calling 911 or calling your MD immediately  if symptoms less severe.  You Must read complete instructions/literature along with all the possible adverse reactions/side effects for all the Medicines you take and that have been prescribed to you. Take any new Medicines after you have completely understood and accpet all the possible adverse reactions/side effects.   Please note  You were cared for by a hospitalist during your hospital stay. If you have any questions about your discharge medications or the care you received while you were in the hospital after you are discharged, you can call the unit and asked to speak with the hospitalist on call if the hospitalist that took care of you is not available. Once you are discharged, your primary care physician will handle any further medical issues. Please note that NO REFILLS for any discharge medications will be authorized once you are discharged, as it is imperative that you return to your primary care physician (or establish a relationship with a primary care physician if you do not have one) for your aftercare needs so that they can reassess your need for medications and monitor your lab values.     Today  Chief Complaint  Patient presents with  . Shortness of Breath   Patient is lethargic   ROS: altered  VITAL SIGNS:  Blood pressure 103/67, pulse 92, temperature 98.5 F (36.9 C), temperature source Axillary, resp. rate 20, height 5\' 2"  (1.575 m), weight 112.356 kg (247 lb 11.2 oz), SpO2 100 %.  I/O:    Intake/Output Summary (Last 24 hours) at 12/27/15 1335 Last data filed at 12/27/15 0800  Gross per 24 hour   Intake    800 ml  Output      0 ml  Net    800 ml    PHYSICAL EXAMINATION:  GENERAL:  50 y.o.-year-old patient lying in the bed with no acute distress.  EYES: Has scleral icterus.  HEENT: Head atraumatic, normocephalic. Oropharynx and nasopharynx clear.  NECK:  Supple, no jugular venous distention. LUNGS: Normal breath sounds bilaterally, no wheezing, rales,rhonchi or crepitation. No use of accessory muscles of respiration.  CARDIOVASCULAR: S1, S2 normal. No murmurs, rubs, or gallops.  ABDOMEN: Soft, non-tender, non-distended. Bowel sounds present. No organomegaly or mass.  EXTREMITIES: No pedal edema, cyanosis, or clubbing.  NEUROLOGIC: Patient is lethargic  PSYCHIATRIC: The patient is disoriented SKIN: No obvious rash, lesion, or ulcer. Jaundiced  DATA REVIEW:   CBC  Recent Labs Lab 12/26/15 0447  WBC 11.0  HGB 10.4*  HCT 31.0*  PLT 63*    Chemistries   Recent Labs Lab 12/26/15 1910  NA 137  K 4.6  CL 119*  CO2 15*  GLUCOSE 115*  BUN 16  CREATININE  UNABLE TO RESULT DUE TO ICTERIC INTERFERENCE  CALCIUM 8.1*  AST 109*  ALT 38  ALKPHOS 240*  BILITOT 25.5*    Cardiac Enzymes No results for input(s): TROPONINI in the last 168 hours.  Microbiology Results  Results for orders placed or performed during the hospital encounter of 12/19/15  Blood Culture (routine x 2)     Status: None   Collection Time: 12/19/15 11:55 AM  Result Value Ref Range Status   Specimen Description BLOOD LEFT ARM  Final   Special Requests BOTTLES DRAWN AEROBIC AND ANAEROBIC [place]  Final   Culture NO GROWTH 5 DAYS  Final   Report Status 12/24/2015 FINAL  Final  Blood Culture (routine x 2)     Status: None   Collection Time: 12/19/15 12:00 PM  Result Value Ref Range Status   Specimen Description BLOOD RIGHT WRIST  Final   Special Requests BOTTLES DRAWN AEROBIC AND ANAEROBIC 7CCAERO,2CCANA  Final   Culture NO GROWTH 5 DAYS  Final   Report Status 12/24/2015 FINAL  Final   Urine culture     Status: None   Collection Time: 12/19/15  2:26 PM  Result Value Ref Range Status   Specimen Description URINE, RANDOM  Final   Special Requests NONE  Final   Culture MULTIPLE SPECIES PRESENT, SUGGEST RECOLLECTION  Final   Report Status 12/21/2015 FINAL  Final  C difficile quick scan w PCR reflex     Status: None   Collection Time: 12/20/15 11:15 AM  Result Value Ref Range Status   C Diff antigen NEGATIVE NEGATIVE Final   C Diff toxin NEGATIVE NEGATIVE Final   C Diff interpretation Negative for C. difficile  Final  Gastrointestinal Panel by PCR , Stool     Status: None   Collection Time: 12/22/15 12:43 AM  Result Value Ref Range Status   Campylobacter species NOT DETECTED NOT DETECTED Final   Plesimonas shigelloides NOT DETECTED NOT DETECTED Final   Salmonella species NOT DETECTED NOT DETECTED Final   Yersinia enterocolitica NOT DETECTED NOT DETECTED Final   Vibrio species NOT DETECTED NOT DETECTED Final   Vibrio cholerae NOT DETECTED NOT DETECTED Final   Enteroaggregative E coli (EAEC) NOT DETECTED NOT DETECTED Final   Enteropathogenic E coli (EPEC) NOT DETECTED NOT DETECTED Final   Enterotoxigenic E coli (ETEC) NOT DETECTED NOT DETECTED Final   Shiga like toxin producing E coli (STEC) NOT DETECTED NOT DETECTED Final   E. coli O157 NOT DETECTED NOT DETECTED Final   Shigella/Enteroinvasive E coli (EIEC) NOT DETECTED NOT DETECTED Final   Cryptosporidium NOT DETECTED NOT DETECTED Final   Cyclospora cayetanensis NOT DETECTED NOT DETECTED Final   Entamoeba histolytica NOT DETECTED NOT DETECTED Final   Giardia lamblia NOT DETECTED NOT DETECTED Final   Adenovirus F40/41 NOT DETECTED NOT DETECTED Final   Astrovirus NOT DETECTED NOT DETECTED Final   Norovirus GI/GII NOT DETECTED NOT DETECTED Final   Rotavirus A NOT DETECTED NOT DETECTED Final   Sapovirus (I, II, IV, and V) NOT DETECTED NOT DETECTED Final    RADIOLOGY:  US Paracentesis  12/24/2015  CLINICAL DATA:   Ascites. EXAM: ULTRASOUND GUIDED PARACENTESIS COMPARISON:  None. PROCEDURE: An ultrasound guided paracentesis was thoroughly discussed with the patient and questions answered. The benefits, risks, alternatives and complications were also discussed. The patient understands and wishes to proceed with the procedure. Written consent was obtained. Ultrasound was performed to localize and mark an adequate pocket of fluid in the right lower quadrant of the abdomen. The area was then prepped and draped in the normal sterile  fashion. 1% Lidocaine was used for local anesthesia. Under ultrasound guidance a Safe-T-Centesis catheter was introduced. Paracentesis was performed. The catheter was removed and a dressing applied. COMPLICATIONS: None immediate. FINDINGS: A total of approximately 1.7 L of serous fluid was removed. A fluid sample was sent for laboratory analysis. IMPRESSION: Successful ultrasound guided paracentesis yielding 1.7 L of ascites. Electronically Signed   By: Marijo Conception, M.D.   On: 12/24/2015 13:54    EKG:   Orders placed or performed during the hospital encounter of 12/19/15  . EKG 12-Lead  . EKG 12-Lead  . EKG 12-Lead  . EKG 12-Lead      Management plans discussed with the patient, family and they are in agreement.  CODE STATUS:     Code Status Orders        Start     Ordered   12/19/15 1706  Do not attempt resuscitation (DNR)   Continuous    Question Answer Comment  In the event of cardiac or respiratory ARREST Do not call a "code blue"   In the event of cardiac or respiratory ARREST Do not perform Intubation, CPR, defibrillation or ACLS   In the event of cardiac or respiratory ARREST Use medication by any route, position, wound care, and other measures to relive pain and suffering. May use oxygen, suction and manual treatment of airway obstruction as needed for comfort.      12/19/15 1706      TOTAL TIME TAKING CARE OF THIS PATIENT: 45  minutes.    @[facility] @  on  12/27/2015 at 1:35 PM  Between 7am to 6pm - Pager - [phone]  After 6pm go to [email or web address] - password [email or web address] [place] [facility]  Office  [phone]  CC: Primary care physician; Lyda Perone, MD

## 2016-01-22 DEATH — deceased

## 2016-08-29 IMAGING — CR DG CHEST 1V PORT
1 series · 1 of 1 positions shown · non-contrast
Comparison: 11/29/2015 chest radiograph.

CLINICAL DATA: Shortness of breath

EXAM:
PORTABLE CHEST 1 VIEW

[ap]
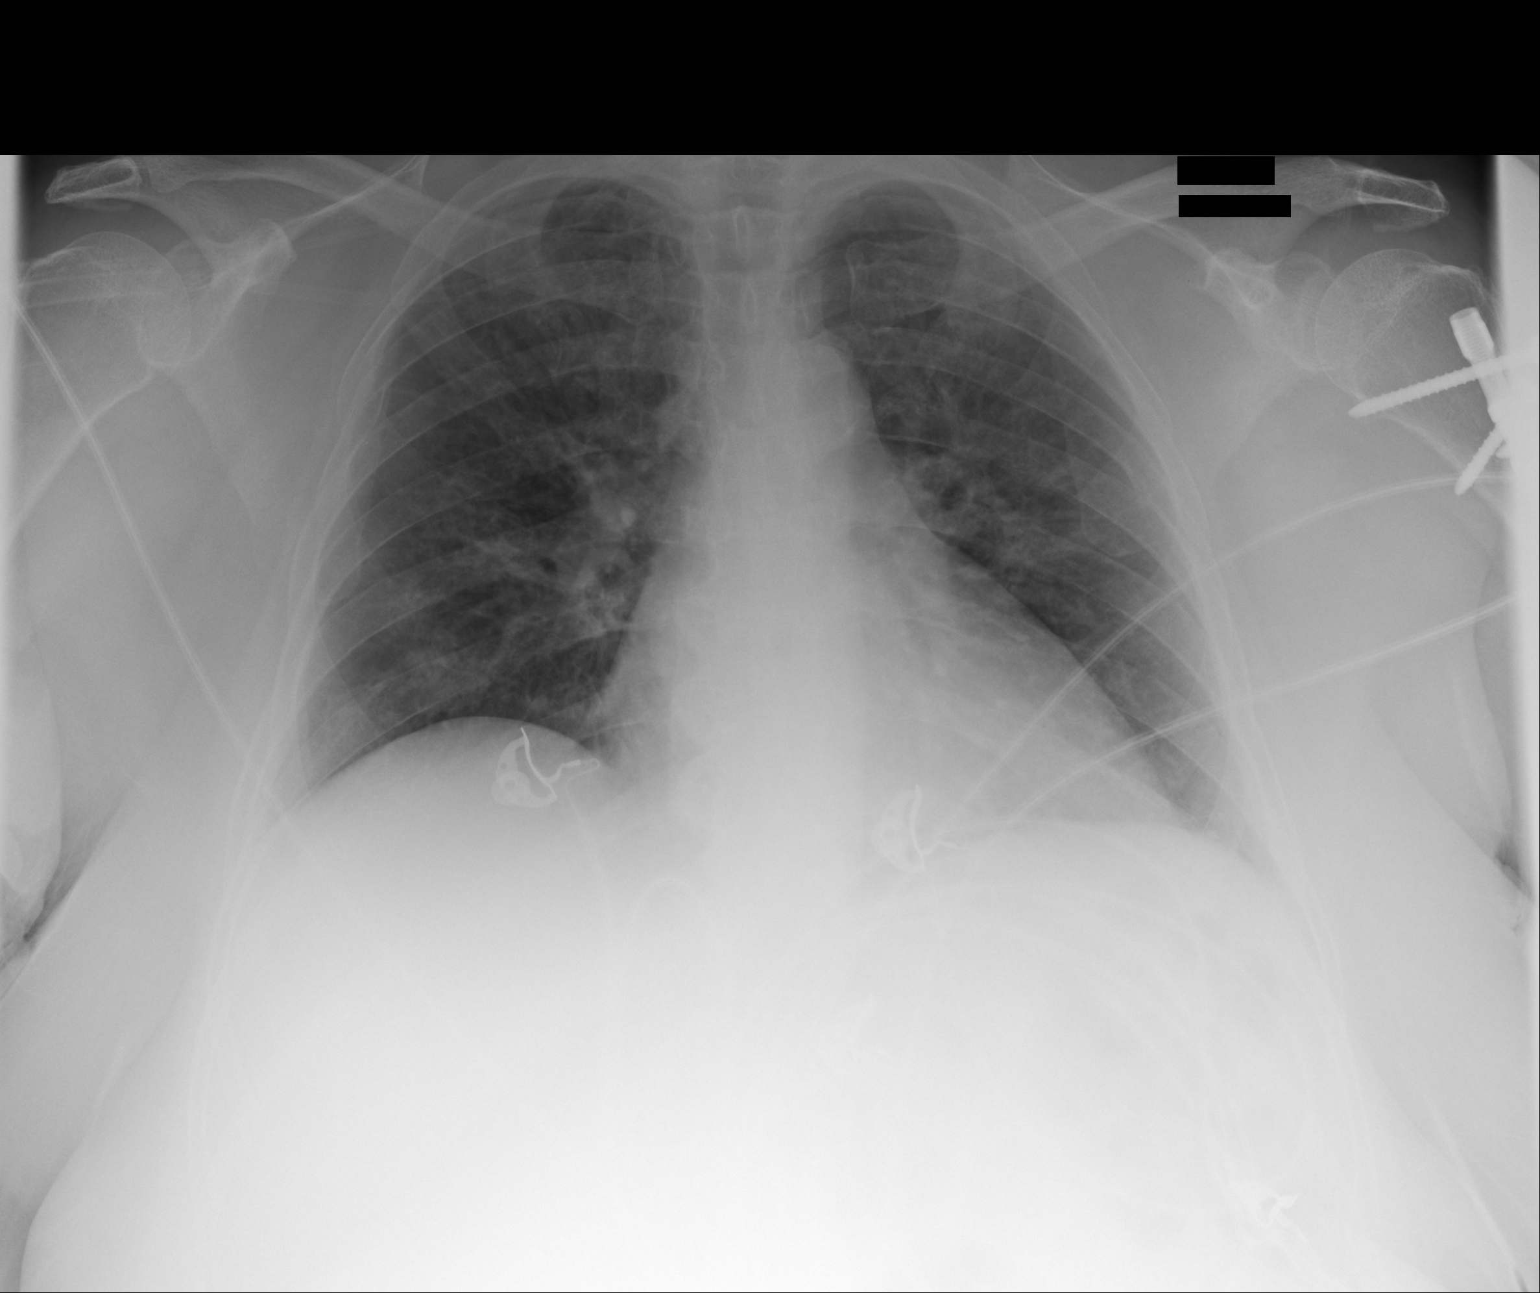

[1 of 1 positions shown; findings below may reference images not displayed]

FINDINGS: Stable cardiomediastinal silhouette with top normal heart size. No
pneumothorax. No pleural effusion. There hazy patchy parahilar
opacities in both lungs.
IMPRESSION: Nonspecific hazy patchy parahilar opacities in both lungs, which
could represent a developing multifocal pneumonia. Consider
short-term follow-up with PA and lateral chest radiographs.

## 2016-08-31 IMAGING — US US ABDOMEN LIMITED
1 series · 8 of 8 positions shown · non-contrast
Comparison: Ultrasound November 07, 2015.

CLINICAL DATA: Hepatic cirrhosis, ascites.

EXAM:
LIMITED ABDOMEN ULTRASOUND FOR ASCITES
TECHNIQUE: Limited ultrasound survey for ascites was performed in all four
abdominal quadrants.

[Series 1: us abdomen limited · 0.34mm/px · 8 of 8 slices shown]
[im 1/8]
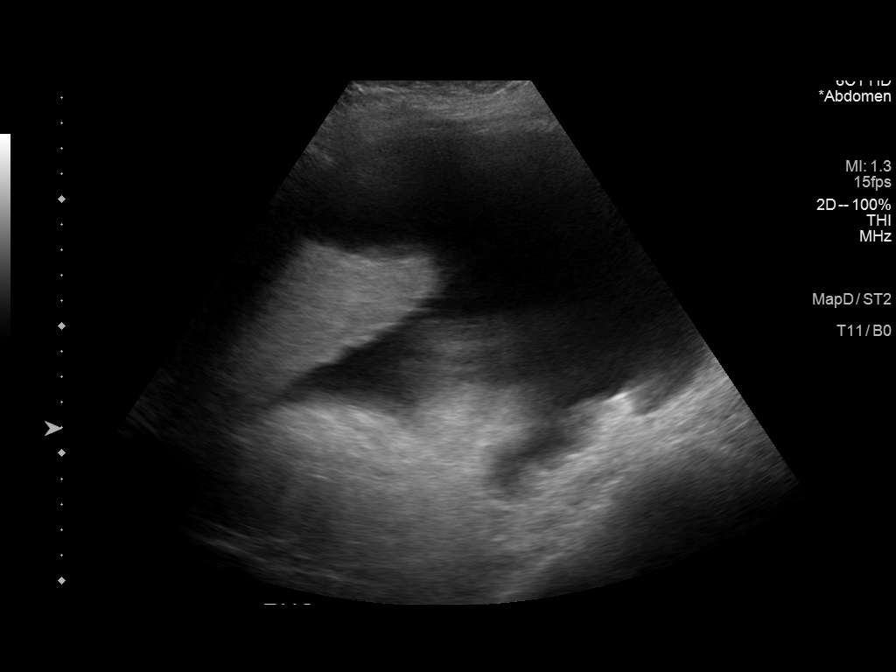
[im 2/8]
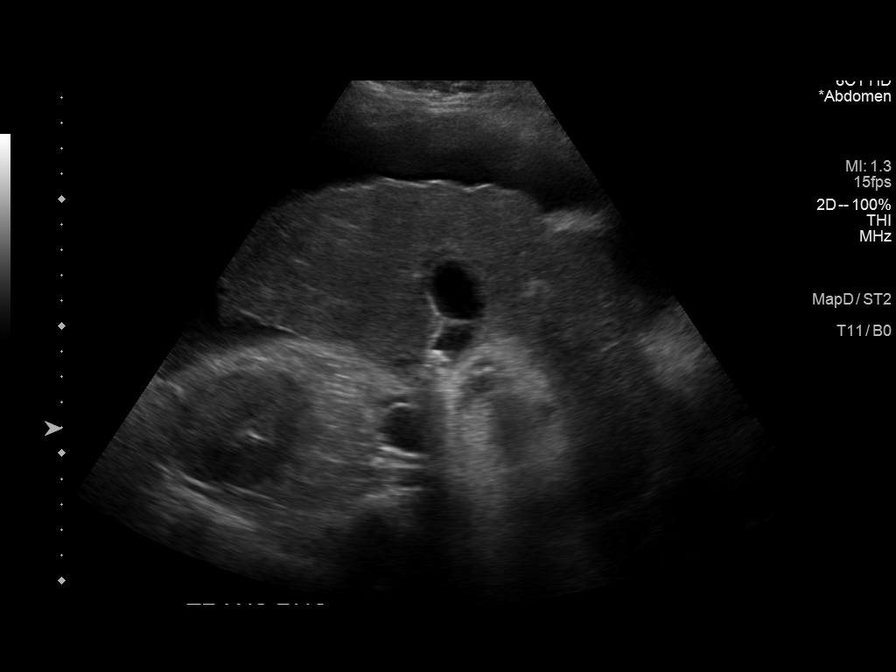
[im 3/8]
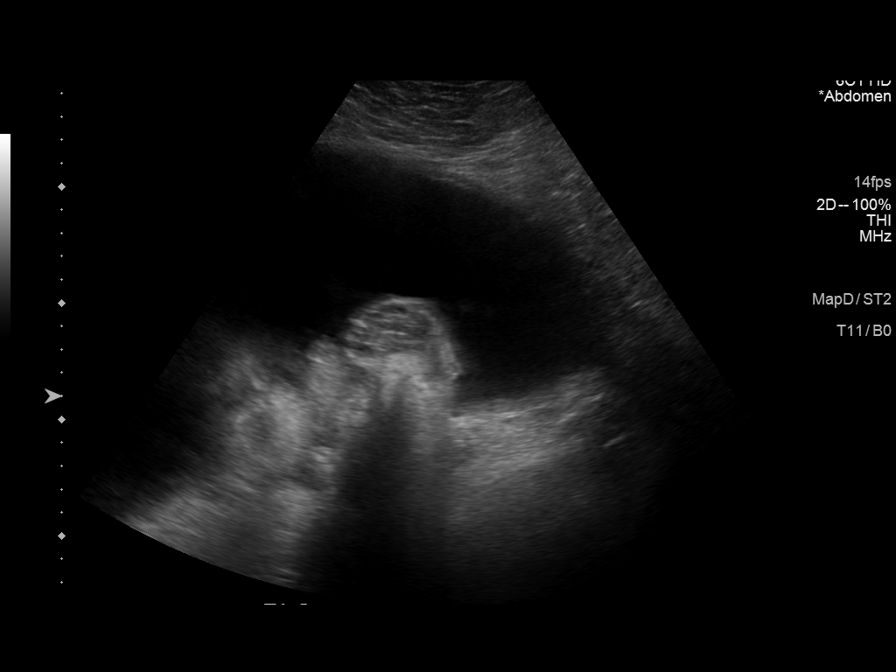
[im 4/8]
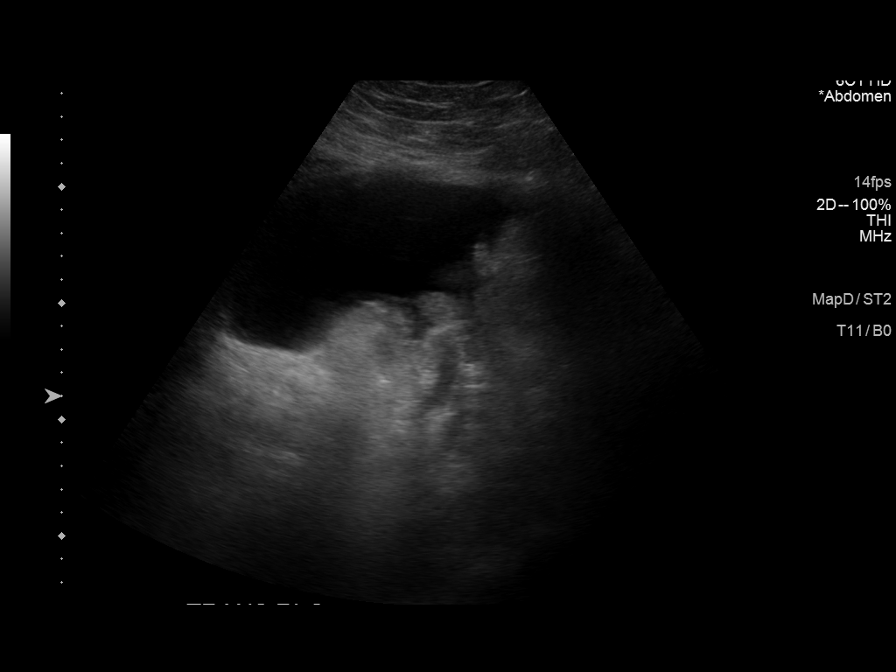
[im 5/8]
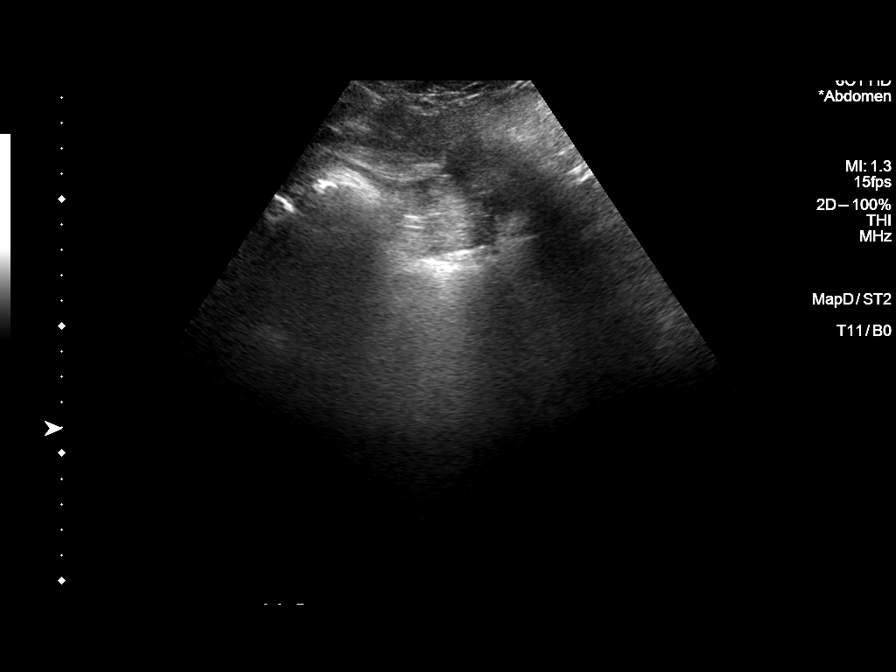
[im 6/8]
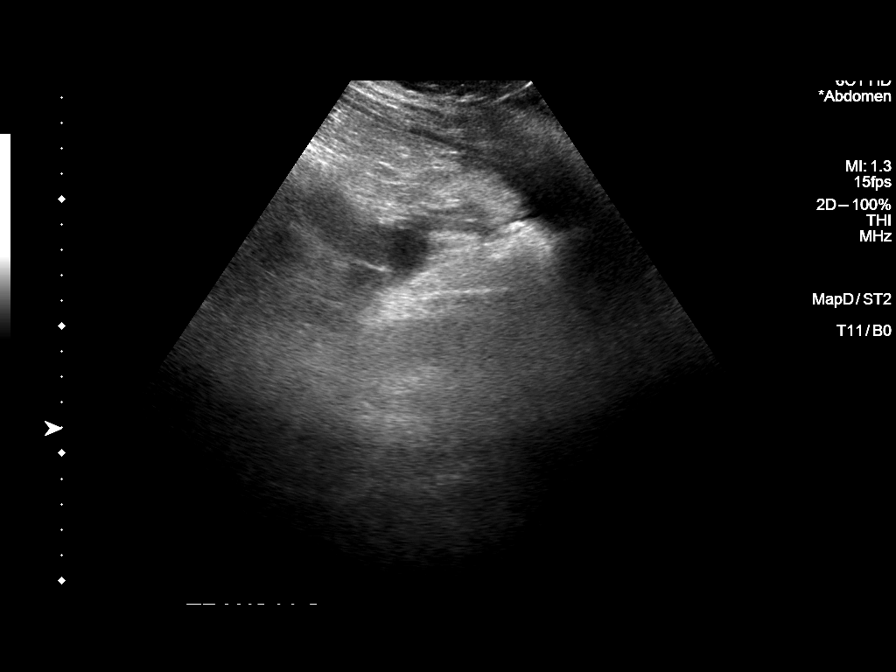
[im 7/8]
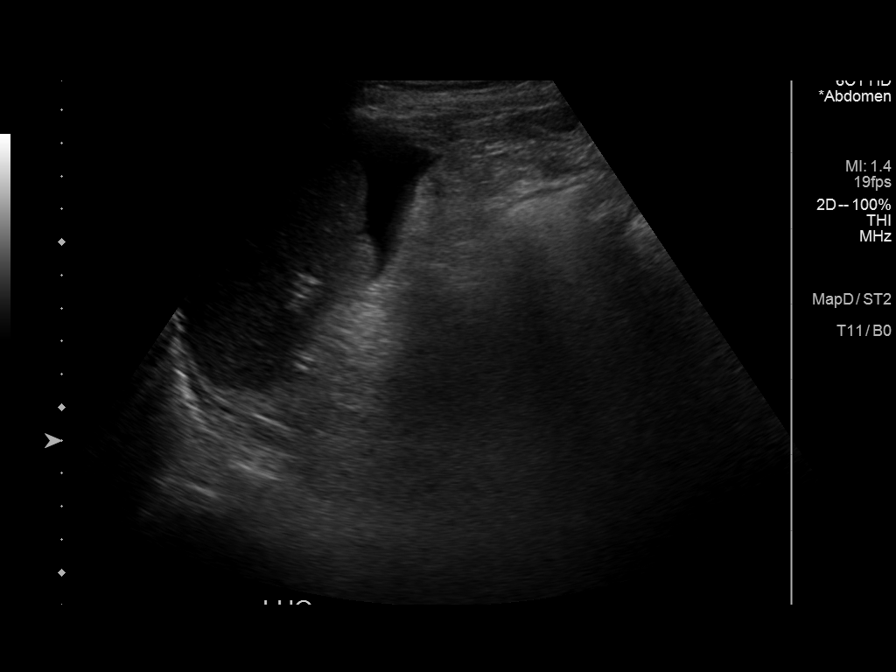
[im 8/8]
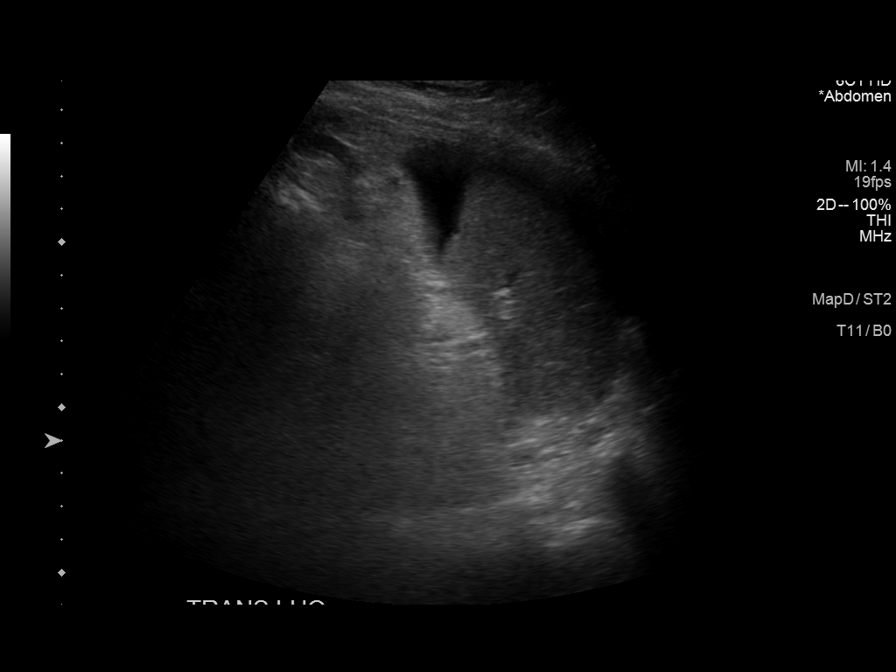

[8 of 8 positions shown; findings below may reference images not displayed]

FINDINGS: Nodular hepatic margins are again noted suggesting hepatic
cirrhosis. Mild to moderate ascites is noted in the right upper
quadrant. Mild to moderate ascites is noted in the right lower
quadrant. Minimal ascites is noted in the left upper and lower
quadrants.
IMPRESSION: Ascites is noted, most prominently seen in the right upper and lower
quadrants.

## 2017-11-18 IMAGING — US US EXTREM LOW VENOUS BILAT
1 series · 14 of 24 positions shown · non-contrast
Comparison: None.

CLINICAL DATA: Bilateral lower extremity pain

EXAM:
BILATERAL LOWER EXTREMITY VENOUS DUPLEX ULTRASOUND
TECHNIQUE: Doppler venous assessment of the bilateral lower extremity deep
venous system was performed, including characterization of spectral
flow, compressibility, and phasicity.

[Series 1: us extrem low venous bilat · 0.08mm/px · 14 of 62 slices shown]
[im 1/62]
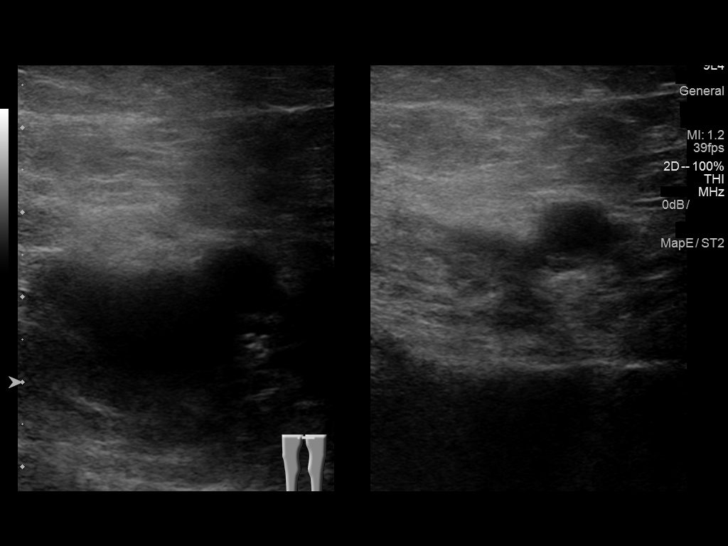
[im 6/62]
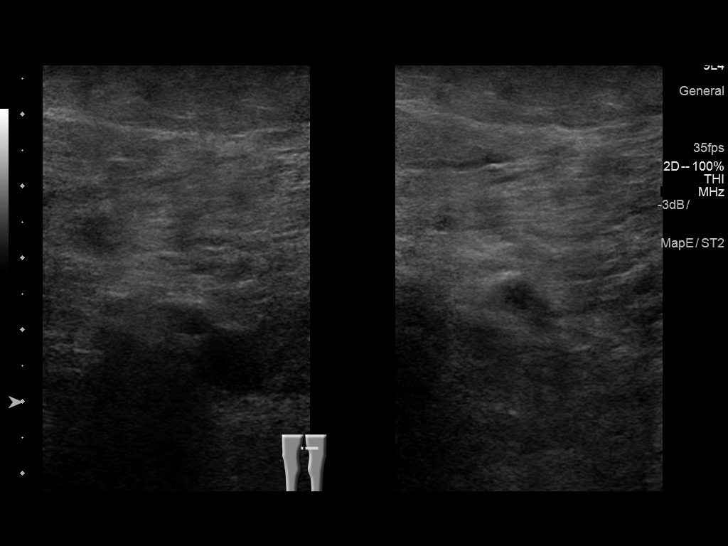
[im 11/62]
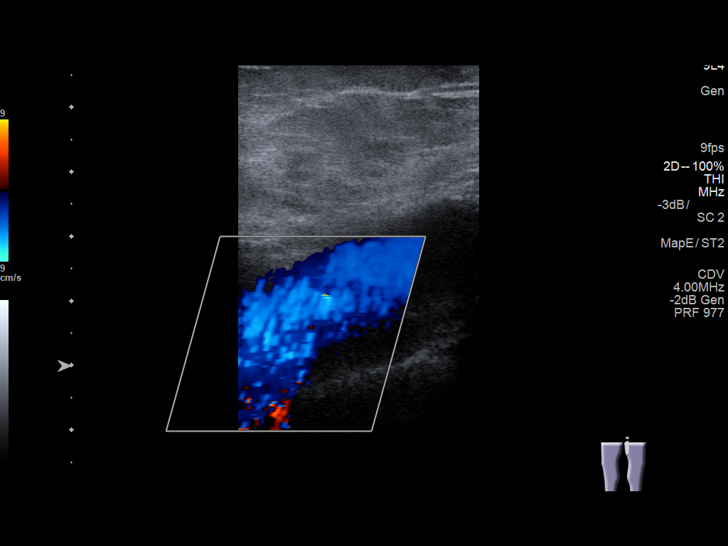
[im 16/62]
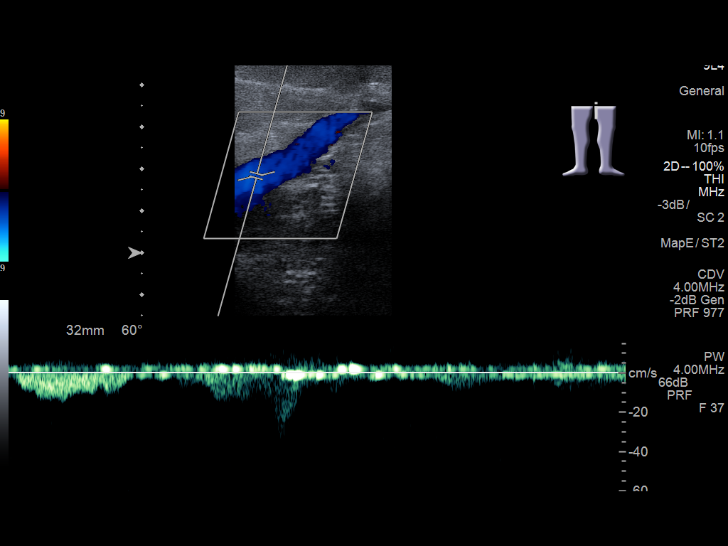
[im 19/62]
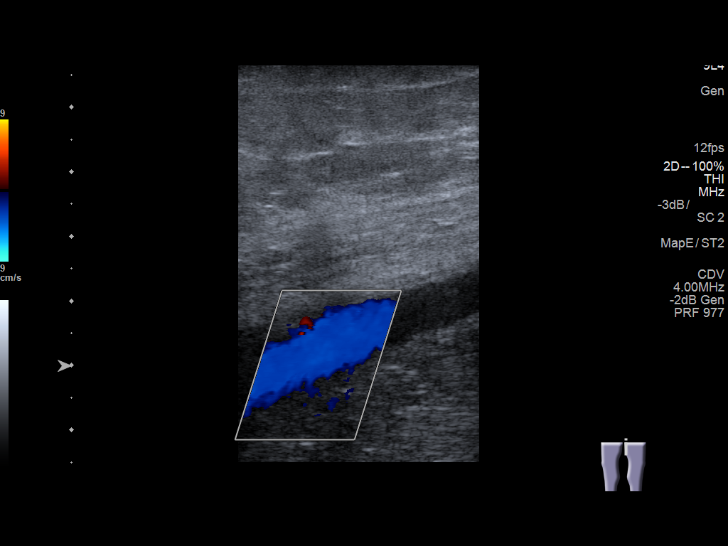
[im 24/62]
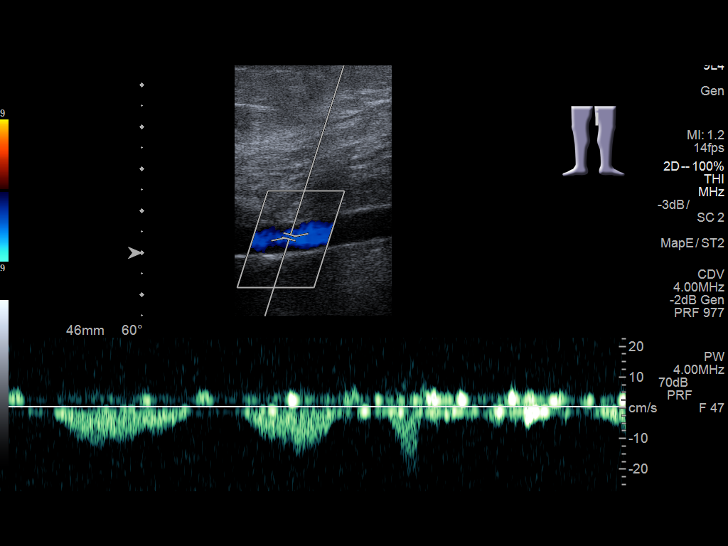
[im 30/62]
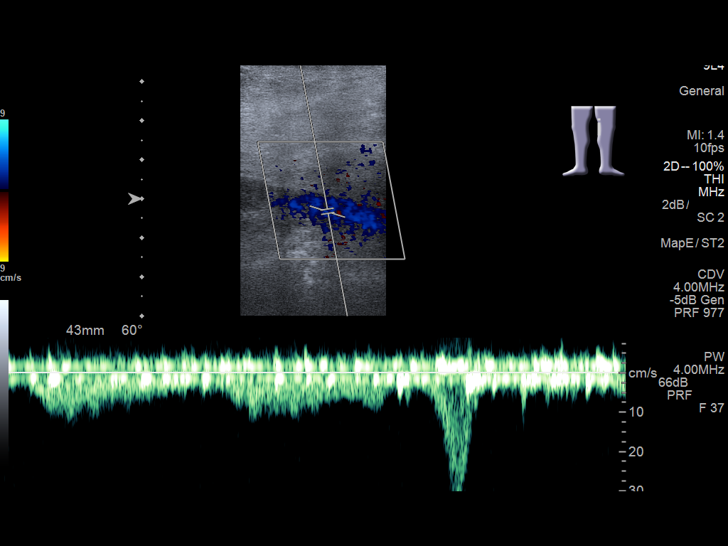
[im 32/62]
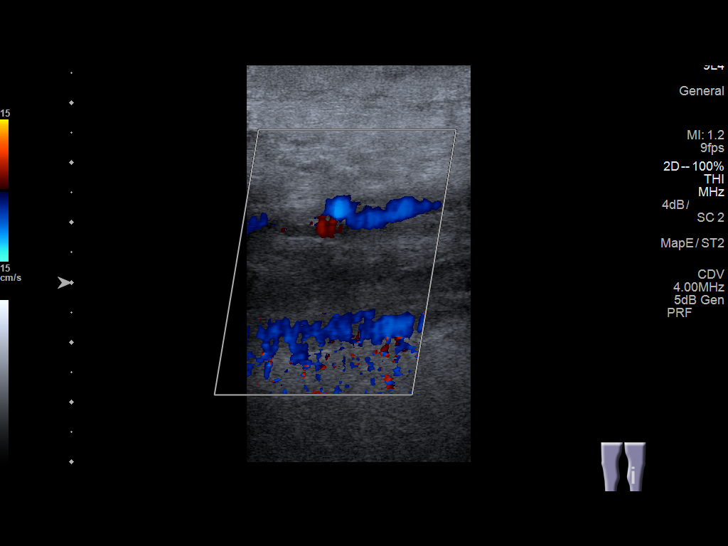
[im 38/62]
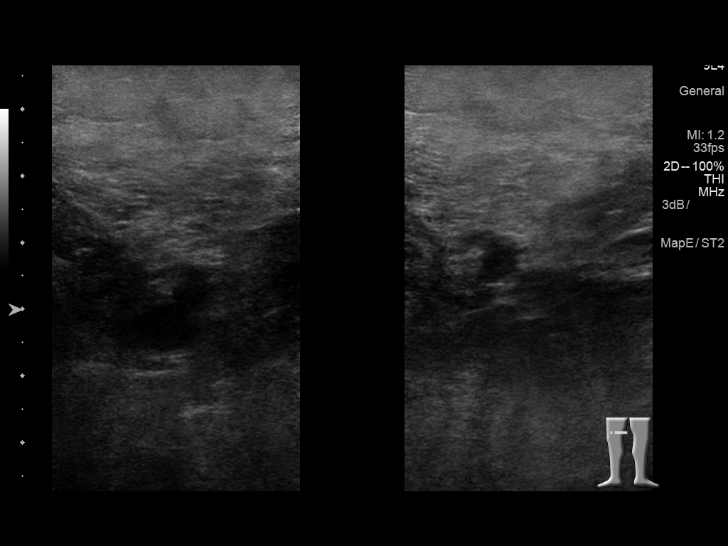
[im 43/62]
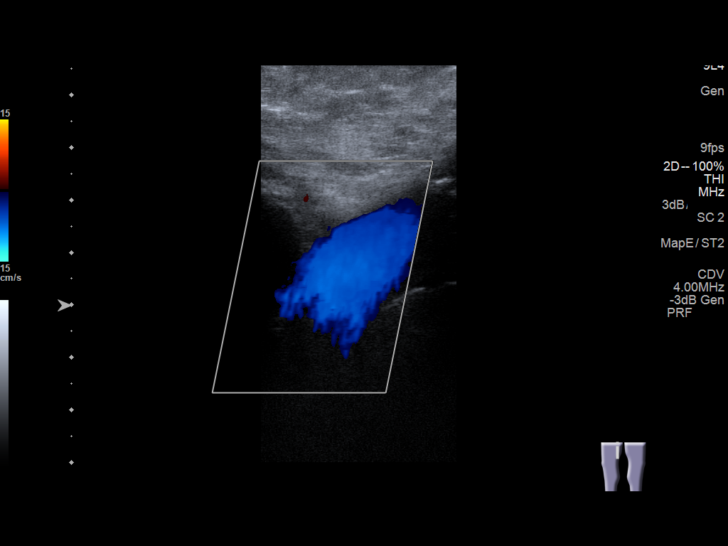
[im 48/62]
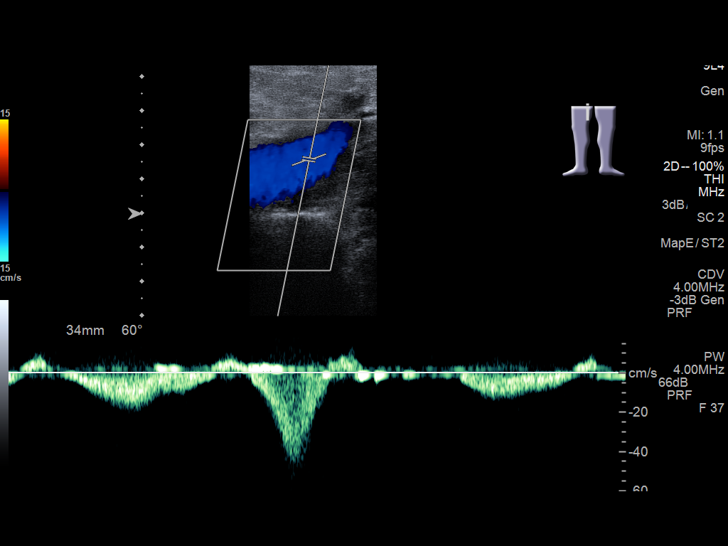
[im 51/62]
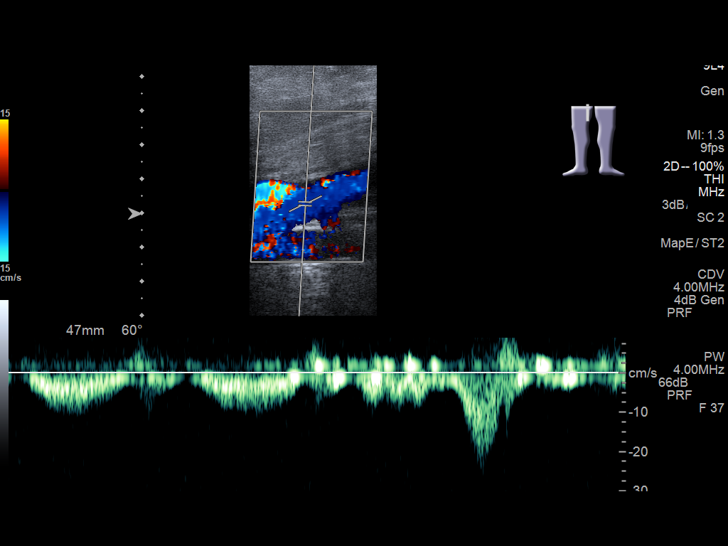
[im 56/62]
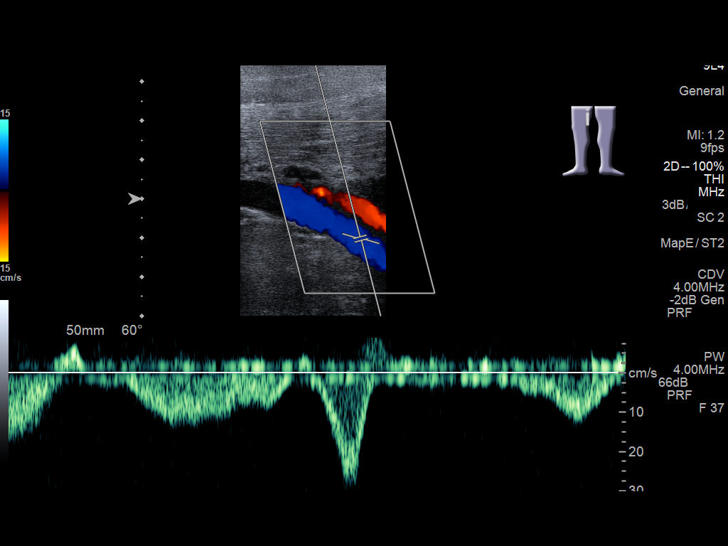
[im 62/62]
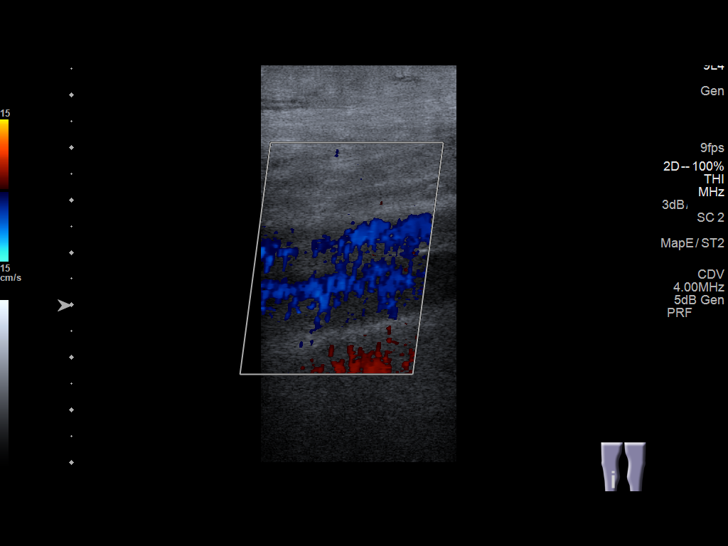

[14 of 24 positions shown; findings below may reference images not displayed]

FINDINGS: There is complete compressibility of the bilateral common femoral,
femoral, and popliteal veins. Doppler analysis demonstrates
respiratory phasicity and augmentation of flow with calf
compression. No evidence of calf vein DVT.
IMPRESSION: No evidence of lower extremity DVT.
